# Patient Record
Sex: Male | Born: 1950
Health system: Southern US, Community
[De-identification: ages and names within clinical notes are randomized; demographics above are authoritative.]

## PROBLEM LIST (undated history)

## (undated) DIAGNOSIS — R42 Dizziness and giddiness: Secondary | ICD-10-CM

## (undated) DIAGNOSIS — I452 Bifascicular block: Secondary | ICD-10-CM

## (undated) DIAGNOSIS — M1612 Unilateral primary osteoarthritis, left hip: Secondary | ICD-10-CM

## (undated) DIAGNOSIS — I440 Atrioventricular block, first degree: Secondary | ICD-10-CM

## (undated) DIAGNOSIS — J449 Chronic obstructive pulmonary disease, unspecified: Secondary | ICD-10-CM

## (undated) DIAGNOSIS — F32A Depression, unspecified: Secondary | ICD-10-CM

## (undated) DIAGNOSIS — K219 Gastro-esophageal reflux disease without esophagitis: Secondary | ICD-10-CM

## (undated) DIAGNOSIS — G4730 Sleep apnea, unspecified: Secondary | ICD-10-CM

## (undated) DIAGNOSIS — I77819 Aortic ectasia, unspecified site: Secondary | ICD-10-CM

## (undated) DIAGNOSIS — J40 Bronchitis, not specified as acute or chronic: Secondary | ICD-10-CM

## (undated) DIAGNOSIS — F329 Major depressive disorder, single episode, unspecified: Secondary | ICD-10-CM

## (undated) DIAGNOSIS — I10 Essential (primary) hypertension: Secondary | ICD-10-CM

## (undated) DIAGNOSIS — I2510 Atherosclerotic heart disease of native coronary artery without angina pectoris: Secondary | ICD-10-CM

## (undated) HISTORY — PX: WISDOM TOOTH EXTRACTION: SHX21

## (undated) HISTORY — PX: COLONOSCOPY: SHX174

## (undated) HISTORY — PX: CATARACT EXTRACTION: SUR2

---

## 2002-10-29 ENCOUNTER — Ambulatory Visit (HOSPITAL_COMMUNITY): Admission: RE | Admit: 2002-10-29 | Discharge: 2002-10-29 | Payer: Self-pay | Admitting: Gastroenterology

## 2002-11-14 ENCOUNTER — Ambulatory Visit (HOSPITAL_COMMUNITY): Admission: RE | Admit: 2002-11-14 | Discharge: 2002-11-14 | Payer: Self-pay | Admitting: Gastroenterology

## 2011-09-17 ENCOUNTER — Other Ambulatory Visit: Payer: Self-pay | Admitting: Family Medicine

## 2011-09-17 DIAGNOSIS — M545 Low back pain, unspecified: Secondary | ICD-10-CM

## 2011-09-21 ENCOUNTER — Ambulatory Visit
Admission: RE | Admit: 2011-09-21 | Discharge: 2011-09-21 | Disposition: A | Discharge status: Home / Self Care | Payer: Medicare Other | Source: Ambulatory Visit | Attending: Family Medicine | Admitting: Family Medicine

## 2011-09-21 DIAGNOSIS — M545 Low back pain, unspecified: Secondary | ICD-10-CM

## 2011-10-07 ENCOUNTER — Other Ambulatory Visit: Payer: Self-pay | Admitting: Neurosurgery

## 2011-10-12 ENCOUNTER — Encounter (HOSPITAL_COMMUNITY): Payer: Self-pay | Admitting: Pharmacy Technician

## 2011-10-12 NOTE — Pre-Procedure Instructions (Signed)
20 Yam[address] Kopf  10/12/2011   Your procedure is scheduled on:  Friday, October 18th  Report to [facility] at 0530 AM.  Call this number if you have problems the morning of surgery: [phone]   Remember:   Do not eat food or drink:After Midnight.  Take these medicines the morning of surgery with A SIP OF WATER: ultram   Do not wear jewelry, make-up or nail polish.  Do not wear lotions, powders, or perfumes.   Do not shave 48 hours prior to surgery. Men may shave face and neck.  Do not bring valuables to the hospital.  Contacts, dentures or bridgework may not be worn into surgery.  Leave suitcase in the car. After surgery it may be brought to your room.  For patients admitted to the hospital, checkout time is 11:00 AM the day of discharge.   Patients discharged the day of surgery will not be allowed to drive home.  Special Instructions: Shower using CHG 2 nights before surgery and the night before surgery.  If you shower the day of surgery use CHG.  Use special wash - you have one bottle of CHG for all showers.  You should use approximately 1/3 of the bottle for each shower.   Please read over the following fact sheets that you were given: Pain Booklet, Coughing and Deep Breathing, MRSA Information and Surgical Site Infection Prevention

## 2011-10-13 ENCOUNTER — Encounter (HOSPITAL_COMMUNITY)
Admission: RE | Admit: 2011-10-13 | Discharge: 2011-10-13 | Disposition: A | Discharge status: Home / Self Care | Payer: Medicare Other | Source: Ambulatory Visit | Attending: Neurosurgery | Admitting: Neurosurgery

## 2011-10-13 ENCOUNTER — Encounter (HOSPITAL_COMMUNITY): Payer: Self-pay

## 2011-10-13 DIAGNOSIS — Z01818 Encounter for other preprocedural examination: Secondary | ICD-10-CM | POA: Insufficient documentation

## 2011-10-13 DIAGNOSIS — I10 Essential (primary) hypertension: Secondary | ICD-10-CM | POA: Insufficient documentation

## 2011-10-13 DIAGNOSIS — Z01810 Encounter for preprocedural cardiovascular examination: Secondary | ICD-10-CM | POA: Insufficient documentation

## 2011-10-13 DIAGNOSIS — Z01812 Encounter for preprocedural laboratory examination: Secondary | ICD-10-CM | POA: Insufficient documentation

## 2011-10-13 HISTORY — DX: Major depressive disorder, single episode, unspecified: F32.9

## 2011-10-13 HISTORY — DX: Essential (primary) hypertension: I10

## 2011-10-13 HISTORY — DX: Dizziness and giddiness: R42

## 2011-10-13 HISTORY — DX: Depression, unspecified: F32.A

## 2011-10-13 LAB — BASIC METABOLIC PANEL
BUN: 13 mg/dL (ref 6–23)
CO2: 27 mEq/L (ref 19–32)
Calcium: 9.8 mg/dL (ref 8.4–10.5)
Chloride: 100 mEq/L (ref 96–112)
Creatinine, Ser: 1.26 mg/dL (ref 0.50–1.35)
GFR calc Af Amer: 70 mL/min — ABNORMAL LOW (ref >90)
GFR calc non Af Amer: 60 mL/min — ABNORMAL LOW (ref >90)
Glucose, Bld: 87 mg/dL (ref 70–99)
Potassium: 4.1 mEq/L (ref 3.5–5.1)
Sodium: 138 mEq/L (ref 135–145)

## 2011-10-13 LAB — CBC
HCT: 42.2 % (ref 39.0–52.0)
Hemoglobin: 15.2 g/dL (ref 13.0–17.0)
MCH: 32.3 pg (ref 26.0–34.0)
MCHC: 36 g/dL (ref 30.0–36.0)
MCV: 89.6 fL (ref 78.0–100.0)
Platelets: 127 10*3/uL — ABNORMAL LOW (ref 150–400)
RBC: 4.71 MIL/uL (ref 4.22–5.81)
RDW: 13.6 % (ref 11.5–15.5)
WBC: 8.7 10*3/uL (ref 4.0–10.5)

## 2011-10-13 LAB — SURGICAL PCR SCREEN
MRSA, PCR: NEGATIVE
Staphylococcus aureus: POSITIVE — AB

## 2011-10-13 NOTE — Progress Notes (Addendum)
Primary Physician - Dr. Evelene Croon Does not have a cardiologist.  Has not had any type of cardiac work-up within last 5 years (cannot recall where he had those testing >5 years ago performed)

## 2011-10-14 ENCOUNTER — Encounter (HOSPITAL_COMMUNITY): Payer: Self-pay | Admitting: Vascular Surgery

## 2011-10-14 ENCOUNTER — Other Ambulatory Visit (HOSPITAL_COMMUNITY): Payer: Medicare Other

## 2011-10-14 NOTE — Consult Note (Addendum)
Anesthesia chart review: Patient is a 61 year old male scheduled for left lumbar 4-5 decompression lumbar laminectomy, discectomy, possible bilateral by Dr. Wynetta Emery on 10/22/11.  History includes nonsmoker, hypertension, depression, vertigo, wisdom tooth extraction. No specific PCP is listed.  EKG on 10/13/2011 showed sinus bradycardia with first-degree AV block, incomplete right bundle-branch block, nonspecific T wave abnormality. Currently, there are no comparison EKGs available.  CXR on 10/13/11 showed: 1. No acute cardiopulmonary process.  2. Potential left ventricular calcifications could represent an aneurysm or infarction.  CXR report called to [facility] at Dr. Lonie Peak office.  Labs noted.  PLT are low at 127K.  Currently, there are no comparison labs available.  I reviewed above, including CXR film and EKG, with Anesthesiologist Dr. Noreene Larsson.  If patient is asymptomatic from a CV standpoint then would plan to proceed from an anesthesia standpoint.  Shonna Chock, PA-C

## 2011-10-22 ENCOUNTER — Encounter (HOSPITAL_COMMUNITY): Admission: RE | Payer: Self-pay | Source: Ambulatory Visit

## 2011-10-22 ENCOUNTER — Ambulatory Visit (HOSPITAL_COMMUNITY): Admission: RE | Admit: 2011-10-22 | Payer: Medicare Other | Source: Ambulatory Visit | Admitting: Neurosurgery

## 2011-10-22 SURGERY — LUMBAR LAMINECTOMY/DECOMPRESSION MICRODISCECTOMY 1 LEVEL
Anesthesia: General | Site: Back | Laterality: Left

## 2011-10-25 ENCOUNTER — Encounter (HOSPITAL_COMMUNITY): Payer: Self-pay | Admitting: Pharmacy Technician

## 2011-11-01 NOTE — Pre-Procedure Instructions (Signed)
20 Daviyon Widmayer  11/01/2011   Your procedure is scheduled on:  Monday November 08, 2011.  Report to [facility] at 0900 AM.  Call this number if you have problems the morning of surgery: [phone]   Remember:   Do not eat food or drink:After Midnight.    Take these medicines the morning of surgery with A SIP OF WATER: None   Do not wear jewelry  Do not wear lotions or colognes.  Men may shave face and neck.  Do not bring valuables to the hospital.  Contacts, dentures or bridgework may not be worn into surgery.  Leave suitcase in the car. After surgery it may be brought to your room.  For patients admitted to the hospital, checkout time is 11:00 AM the day of discharge.   Patients discharged the day of surgery will not be allowed to drive home.  Name and phone number of your driver:   Special Instructions: Shower using CHG 2 nights before surgery and the night before surgery.  If you shower the day of surgery use CHG.  Use special wash - you have one bottle of CHG for all showers.  You should use approximately 1/3 of the bottle for each shower.   Please read over the following fact sheets that you were given: Pain Booklet, Coughing and Deep Breathing, MRSA Information and Surgical Site Infection Prevention

## 2011-11-02 ENCOUNTER — Encounter (HOSPITAL_COMMUNITY)
Admission: RE | Admit: 2011-11-02 | Discharge: 2011-11-02 | Disposition: A | Discharge status: Home / Self Care | Payer: Medicare Other | Source: Ambulatory Visit | Attending: Neurosurgery | Admitting: Neurosurgery

## 2011-11-02 ENCOUNTER — Encounter (HOSPITAL_COMMUNITY): Payer: Self-pay

## 2011-11-02 HISTORY — DX: Bronchitis, not specified as acute or chronic: J40

## 2011-11-02 LAB — BASIC METABOLIC PANEL
BUN: 13 mg/dL (ref 6–23)
CO2: 23 mEq/L (ref 19–32)
Calcium: 9.5 mg/dL (ref 8.4–10.5)
Chloride: 95 mEq/L — ABNORMAL LOW (ref 96–112)
Creatinine, Ser: 1.26 mg/dL (ref 0.50–1.35)
GFR calc Af Amer: 70 mL/min — ABNORMAL LOW (ref >90)
GFR calc non Af Amer: 60 mL/min — ABNORMAL LOW (ref >90)
Glucose, Bld: 66 mg/dL — ABNORMAL LOW (ref 70–99)
Potassium: 3.6 mEq/L (ref 3.5–5.1)
Sodium: 132 mEq/L — ABNORMAL LOW (ref 135–145)

## 2011-11-02 LAB — CBC
HCT: 43.9 % (ref 39.0–52.0)
Hemoglobin: 15.7 g/dL (ref 13.0–17.0)
MCH: 31.9 pg (ref 26.0–34.0)
MCHC: 35.8 g/dL (ref 30.0–36.0)
MCV: 89.2 fL (ref 78.0–100.0)
Platelets: 181 10*3/uL (ref 150–400)
RBC: 4.92 MIL/uL (ref 4.22–5.81)
RDW: 13.7 % (ref 11.5–15.5)
WBC: 9 10*3/uL (ref 4.0–10.5)

## 2011-11-02 NOTE — Progress Notes (Signed)
Patient informed Nurse that he had a stress test but is unaware of where it occurred. Patient denied having a cardiac cath, or sleep study. PCP is Dr. Evelene Croon. LOV was earlier this year.

## 2011-11-02 NOTE — Progress Notes (Signed)
Forwarded Abnormal lab to anesthesia for review.

## 2011-11-03 NOTE — Consult Note (Signed)
Anesthesia chart review: Patient is a 61 year old male posted for a one level decompression/laminectomy on 11/08/11 by Dr. Wynetta Emery.  (See my note from 10/22/11.)  This procedure was initially scheduled for 10/22/11, but patient wanted to reschedule to first attempt physical therapy.  Dr. Wynetta Emery also wanted him to have a chest CT (see below) to further evaluate his CXR findings from 10/13/11.  PCP is Dr. Philis Fendt with [facility].  Chest CT with contrast on 10/18/11 at [facility] showed calcifications on plain films represents pericardial calcifications, likely related to prior pericardial infection or hemorrhage.  Coronary calcifications within both the right and left coronary artery.  No aortic aneurysms or dissection.  Labs noted.  I called and spoke with Charles Navarro.  He denies known history of pericarditis of pericardial effusion.  He has never had chest pain and denies SOB and even dyspnea with moderate activity.  He denies edema.  He has never had prior cardiac studies other than an EKG.  Prior to his back pain and radiculopathy symptoms that began in September of this year, he was walking 5 miles per day and was weight lifting up to 100 lbs.    I reviewed above including recent CXR, EKG, and chest CT scan findings with Anesthesiologist Dr. Michelle Piper.  Since patient is asymptomatic with good recent exercise tolerance then plan to proceed in no significant change in his status.  Shonna Chock, PA-C 11/03/11 1718

## 2011-11-08 ENCOUNTER — Inpatient Hospital Stay (HOSPITAL_COMMUNITY): Payer: Medicare Other

## 2011-11-08 ENCOUNTER — Encounter (HOSPITAL_COMMUNITY): Payer: Self-pay | Admitting: Vascular Surgery

## 2011-11-08 ENCOUNTER — Encounter (HOSPITAL_COMMUNITY)
Admission: RE | Disposition: A | Discharge status: Home / Self Care | Payer: Self-pay | Source: Ambulatory Visit | Attending: Neurosurgery

## 2011-11-08 ENCOUNTER — Encounter (HOSPITAL_COMMUNITY): Payer: Self-pay | Admitting: Surgery

## 2011-11-08 ENCOUNTER — Inpatient Hospital Stay (HOSPITAL_COMMUNITY): Payer: Medicare Other | Admitting: Vascular Surgery

## 2011-11-08 ENCOUNTER — Inpatient Hospital Stay (HOSPITAL_COMMUNITY)
Admission: RE | Admit: 2011-11-08 | Discharge: 2011-11-09 | DRG: 491 | Disposition: A | Discharge status: Home / Self Care | Payer: Medicare Other | Source: Ambulatory Visit | Attending: Neurosurgery | Admitting: Neurosurgery

## 2011-11-08 DIAGNOSIS — Z7982 Long term (current) use of aspirin: Secondary | ICD-10-CM

## 2011-11-08 DIAGNOSIS — Z79899 Other long term (current) drug therapy: Secondary | ICD-10-CM

## 2011-11-08 DIAGNOSIS — F329 Major depressive disorder, single episode, unspecified: Secondary | ICD-10-CM | POA: Diagnosis present

## 2011-11-08 DIAGNOSIS — M5126 Other intervertebral disc displacement, lumbar region: Principal | ICD-10-CM | POA: Diagnosis present

## 2011-11-08 DIAGNOSIS — R209 Unspecified disturbances of skin sensation: Secondary | ICD-10-CM | POA: Diagnosis present

## 2011-11-08 DIAGNOSIS — Z885 Allergy status to narcotic agent status: Secondary | ICD-10-CM

## 2011-11-08 DIAGNOSIS — I10 Essential (primary) hypertension: Secondary | ICD-10-CM | POA: Diagnosis present

## 2011-11-08 DIAGNOSIS — Z888 Allergy status to other drugs, medicaments and biological substances status: Secondary | ICD-10-CM

## 2011-11-08 DIAGNOSIS — F3289 Other specified depressive episodes: Secondary | ICD-10-CM | POA: Diagnosis present

## 2011-11-08 HISTORY — PX: LUMBAR LAMINECTOMY/DECOMPRESSION MICRODISCECTOMY: SHX5026

## 2011-11-08 SURGERY — LUMBAR LAMINECTOMY/DECOMPRESSION MICRODISCECTOMY 1 LEVEL
Anesthesia: General | Site: Back | Laterality: Left | Wound class: Clean

## 2011-11-08 MED ORDER — HYDROCHLOROTHIAZIDE 12.5 MG PO CAPS
12.5000 mg | ORAL_CAPSULE | Freq: Every day | ORAL | Status: DC
  Administered 2011-11-08 – 2011-11-09 (×2): 12.5 mg via ORAL
  Filled 2011-11-08 (×2): qty 1

## 2011-11-08 MED ORDER — CYCLOBENZAPRINE HCL 10 MG PO TABS: 10.0000 mg | ORAL_TABLET | Freq: Three times a day (TID) | ORAL | Status: DC | PRN

## 2011-11-08 MED ORDER — LACTATED RINGERS IV SOLN
INTRAVENOUS | Status: DC | PRN
  Administered 2011-11-08 (×2): via INTRAVENOUS

## 2011-11-08 MED ORDER — THROMBIN 5000 UNITS EX SOLR
CUTANEOUS | Status: DC | PRN
  Administered 2011-11-08 (×2): 5000 [IU] via TOPICAL

## 2011-11-08 MED ORDER — PHENOL 1.4 % MT LIQD: 1.0000 | OROMUCOSAL | Status: DC | PRN

## 2011-11-08 MED ORDER — ADULT MULTIVITAMIN W/MINERALS CH
1.0000 | ORAL_TABLET | Freq: Every day | ORAL | Status: DC
  Administered 2011-11-09: 1 via ORAL
  Filled 2011-11-08: qty 1

## 2011-11-08 MED ORDER — LISINOPRIL 10 MG PO TABS
10.0000 mg | ORAL_TABLET | Freq: Every day | ORAL | Status: DC
  Administered 2011-11-08 – 2011-11-09 (×2): 10 mg via ORAL
  Filled 2011-11-08 (×2): qty 1

## 2011-11-08 MED ORDER — CYCLOBENZAPRINE HCL 10 MG PO TABS
10.0000 mg | ORAL_TABLET | Freq: Three times a day (TID) | ORAL | Status: DC | PRN
  Administered 2011-11-08: 10 mg via ORAL

## 2011-11-08 MED ORDER — SODIUM CHLORIDE 0.9 % IJ SOLN
3.0000 mL | Freq: Two times a day (BID) | INTRAMUSCULAR | Status: DC
  Administered 2011-11-08: 3 mL via INTRAVENOUS

## 2011-11-08 MED ORDER — SODIUM CHLORIDE 0.9 % IV SOLN: 250.0000 mL | INTRAVENOUS | Status: DC

## 2011-11-08 MED ORDER — MIDAZOLAM HCL 5 MG/5ML IJ SOLN
INTRAMUSCULAR | Status: DC | PRN
  Administered 2011-11-08: 2 mg via INTRAVENOUS

## 2011-11-08 MED ORDER — PHENTERMINE HCL 37.5 MG PO TABS: 37.5000 mg | ORAL_TABLET | Freq: Every day | ORAL | Status: DC

## 2011-11-08 MED ORDER — CEFAZOLIN SODIUM 1-5 GM-% IV SOLN
1.0000 g | Freq: Three times a day (TID) | INTRAVENOUS | Status: AC
  Administered 2011-11-08 – 2011-11-09 (×2): 1 g via INTRAVENOUS
  Filled 2011-11-08 (×2): qty 50

## 2011-11-08 MED ORDER — CEFAZOLIN SODIUM 10 G IJ SOLR
3.0000 g | INTRAMUSCULAR | Status: AC
  Administered 2011-11-08: 3 g via INTRAVENOUS
  Filled 2011-11-08 (×2): qty 3000

## 2011-11-08 MED ORDER — BACITRACIN 50000 UNITS IM SOLR
INTRAMUSCULAR | Status: DC | PRN
  Administered 2011-11-08: 12:00:00

## 2011-11-08 MED ORDER — FENTANYL CITRATE 0.05 MG/ML IJ SOLN
INTRAMUSCULAR | Status: DC | PRN
  Administered 2011-11-08: 150 ug via INTRAVENOUS

## 2011-11-08 MED ORDER — BUPIVACAINE HCL (PF) 0.25 % IJ SOLN
INTRAMUSCULAR | Status: DC | PRN
  Administered 2011-11-08: 10 mL

## 2011-11-08 MED ORDER — PROPOFOL 10 MG/ML IV BOLUS
INTRAVENOUS | Status: DC | PRN
  Administered 2011-11-08: 180 mg via INTRAVENOUS

## 2011-11-08 MED ORDER — HYDROMORPHONE HCL PF 1 MG/ML IJ SOLN: 0.5000 mg | INTRAMUSCULAR | Status: DC | PRN

## 2011-11-08 MED ORDER — ACETAMINOPHEN 325 MG PO TABS: 650.0000 mg | ORAL_TABLET | ORAL | Status: DC | PRN

## 2011-11-08 MED ORDER — MELOXICAM 15 MG PO TABS
15.0000 mg | ORAL_TABLET | Freq: Every day | ORAL | Status: DC
  Administered 2011-11-09: 15 mg via ORAL
  Filled 2011-11-08: qty 1

## 2011-11-08 MED ORDER — LISINOPRIL-HYDROCHLOROTHIAZIDE 10-12.5 MG PO TABS: 1.0000 | ORAL_TABLET | Freq: Every day | ORAL | Status: DC

## 2011-11-08 MED ORDER — GLYCOPYRROLATE 0.2 MG/ML IJ SOLN
INTRAMUSCULAR | Status: DC | PRN
  Administered 2011-11-08: 0.4 mg via INTRAVENOUS

## 2011-11-08 MED ORDER — LIDOCAINE-EPINEPHRINE 1 %-1:100000 IJ SOLN
INTRAMUSCULAR | Status: DC | PRN
  Administered 2011-11-08: 10 mL

## 2011-11-08 MED ORDER — ASPIRIN EC 81 MG PO TBEC
81.0000 mg | DELAYED_RELEASE_TABLET | Freq: Every day | ORAL | Status: DC
  Administered 2011-11-09: 81 mg via ORAL
  Filled 2011-11-08: qty 1

## 2011-11-08 MED ORDER — ETODOLAC 400 MG PO TABS
400.0000 mg | ORAL_TABLET | Freq: Every day | ORAL | Status: DC
  Administered 2011-11-09: 400 mg via ORAL
  Filled 2011-11-08: qty 1

## 2011-11-08 MED ORDER — MENTHOL 3 MG MT LOZG: 1.0000 | LOZENGE | OROMUCOSAL | Status: DC | PRN

## 2011-11-08 MED ORDER — ALUM & MAG HYDROXIDE-SIMETH 200-200-20 MG/5ML PO SUSP: 30.0000 mL | Freq: Four times a day (QID) | ORAL | Status: DC | PRN

## 2011-11-08 MED ORDER — ROCURONIUM BROMIDE 100 MG/10ML IV SOLN
INTRAVENOUS | Status: DC | PRN
  Administered 2011-11-08: 20 mg via INTRAVENOUS
  Administered 2011-11-08: 50 mg via INTRAVENOUS

## 2011-11-08 MED ORDER — SODIUM CHLORIDE 0.9 % IV SOLN
INTRAVENOUS | Status: AC
  Filled 2011-11-08: qty 500

## 2011-11-08 MED ORDER — LIDOCAINE HCL (CARDIAC) 20 MG/ML IV SOLN
INTRAVENOUS | Status: DC | PRN
  Administered 2011-11-08: 40 mg via INTRAVENOUS

## 2011-11-08 MED ORDER — NEOSTIGMINE METHYLSULFATE 1 MG/ML IJ SOLN
INTRAMUSCULAR | Status: DC | PRN
  Administered 2011-11-08: 3 mg via INTRAVENOUS

## 2011-11-08 MED ORDER — ARTIFICIAL TEARS OP OINT
TOPICAL_OINTMENT | OPHTHALMIC | Status: DC | PRN
  Administered 2011-11-08: 1 via OPHTHALMIC

## 2011-11-08 MED ORDER — BACITRACIN 50000 UNITS IM SOLR
INTRAMUSCULAR | Status: AC
  Filled 2011-11-08: qty 1

## 2011-11-08 MED ORDER — CYCLOBENZAPRINE HCL 10 MG PO TABS
ORAL_TABLET | ORAL | Status: AC
  Filled 2011-11-08: qty 1

## 2011-11-08 MED ORDER — ONDANSETRON HCL 4 MG/2ML IJ SOLN
4.0000 mg | INTRAMUSCULAR | Status: DC | PRN
  Administered 2011-11-08: 4 mg via INTRAVENOUS

## 2011-11-08 MED ORDER — DOCUSATE SODIUM 100 MG PO CAPS
100.0000 mg | ORAL_CAPSULE | Freq: Two times a day (BID) | ORAL | Status: DC
  Administered 2011-11-08 – 2011-11-09 (×2): 100 mg via ORAL
  Filled 2011-11-08 (×2): qty 1

## 2011-11-08 MED ORDER — ACETAMINOPHEN 650 MG RE SUPP: 650.0000 mg | RECTAL | Status: DC | PRN

## 2011-11-08 MED ORDER — OXYCODONE-ACETAMINOPHEN 5-325 MG PO TABS
1.0000 | ORAL_TABLET | ORAL | Status: DC | PRN
  Administered 2011-11-08 – 2011-11-09 (×2): 2 via ORAL
  Filled 2011-11-08: qty 2

## 2011-11-08 MED ORDER — HEMOSTATIC AGENTS (NO CHARGE) OPTIME
TOPICAL | Status: DC | PRN
  Administered 2011-11-08: 1 via TOPICAL

## 2011-11-08 MED ORDER — ONDANSETRON HCL 4 MG/2ML IJ SOLN
INTRAMUSCULAR | Status: AC
  Filled 2011-11-08: qty 2

## 2011-11-08 MED ORDER — OXYCODONE-ACETAMINOPHEN 5-325 MG PO TABS
ORAL_TABLET | ORAL | Status: AC
  Filled 2011-11-08: qty 2

## 2011-11-08 MED ORDER — ONDANSETRON HCL 4 MG/2ML IJ SOLN
INTRAMUSCULAR | Status: DC | PRN
  Administered 2011-11-08: 4 mg via INTRAVENOUS

## 2011-11-08 MED ORDER — HYDROMORPHONE HCL PF 1 MG/ML IJ SOLN
INTRAMUSCULAR | Status: AC
  Filled 2011-11-08: qty 1

## 2011-11-08 MED ORDER — 0.9 % SODIUM CHLORIDE (POUR BTL) OPTIME
TOPICAL | Status: DC | PRN
  Administered 2011-11-08: 1000 mL

## 2011-11-08 MED ORDER — SODIUM CHLORIDE 0.9 % IJ SOLN
3.0000 mL | INTRAMUSCULAR | Status: DC | PRN
  Administered 2011-11-08: 3 mL via INTRAVENOUS

## 2011-11-08 SURGICAL SUPPLY — 59 items
BAG DECANTER FOR FLEXI CONT (MISCELLANEOUS) ×2 IMPLANT
BENZOIN TINCTURE PRP APPL 2/3 (GAUZE/BANDAGES/DRESSINGS) ×2 IMPLANT
BLADE SURG 11 STRL SS (BLADE) ×2 IMPLANT
BLADE SURG ROTATE 9660 (MISCELLANEOUS) IMPLANT
BRUSH SCRUB EZ PLAIN DRY (MISCELLANEOUS) ×2 IMPLANT
BUR MATCHSTICK NEURO 3.0 LAGG (BURR) ×2 IMPLANT
BUR PRECISION FLUTE 6.0 (BURR) ×2 IMPLANT
CANISTER SUCTION 2500CC (MISCELLANEOUS) ×2 IMPLANT
CLOTH BEACON ORANGE TIMEOUT ST (SAFETY) ×2 IMPLANT
CONT SPEC 4OZ CLIKSEAL STRL BL (MISCELLANEOUS) ×2 IMPLANT
DECANTER SPIKE VIAL GLASS SM (MISCELLANEOUS) ×2 IMPLANT
DERMABOND ADVANCED (GAUZE/BANDAGES/DRESSINGS) ×1
DERMABOND ADVANCED .7 DNX12 (GAUZE/BANDAGES/DRESSINGS) ×1 IMPLANT
DRAPE INCISE IOBAN 85X60 (DRAPES) ×2 IMPLANT
DRAPE LAPAROTOMY 100X72X124 (DRAPES) ×2 IMPLANT
DRAPE MICROSCOPE ZEISS OPMI (DRAPES) ×2 IMPLANT
DRAPE POUCH INSTRU U-SHP 10X18 (DRAPES) ×2 IMPLANT
DRAPE PROXIMA HALF (DRAPES) IMPLANT
DRAPE SURG 17X23 STRL (DRAPES) ×2 IMPLANT
DRSG OPSITE 4X5.5 SM (GAUZE/BANDAGES/DRESSINGS) ×2 IMPLANT
ELECT REM PT RETURN 9FT ADLT (ELECTROSURGICAL) ×2
ELECTRODE REM PT RTRN 9FT ADLT (ELECTROSURGICAL) ×1 IMPLANT
EVACUATOR 1/8 PVC DRAIN (DRAIN) ×2 IMPLANT
GAUZE SPONGE 4X4 16PLY XRAY LF (GAUZE/BANDAGES/DRESSINGS) IMPLANT
GLOVE BIO SURGEON STRL SZ8 (GLOVE) ×2 IMPLANT
GLOVE BIOGEL PI IND STRL 6.5 (GLOVE) ×1 IMPLANT
GLOVE BIOGEL PI IND STRL 8 (GLOVE) ×2 IMPLANT
GLOVE BIOGEL PI IND STRL 8.5 (GLOVE) ×2 IMPLANT
GLOVE BIOGEL PI INDICATOR 6.5 (GLOVE) ×1
GLOVE BIOGEL PI INDICATOR 8 (GLOVE) ×2
GLOVE BIOGEL PI INDICATOR 8.5 (GLOVE) ×2
GLOVE ECLIPSE 8.0 STRL XLNG CF (GLOVE) ×2 IMPLANT
GLOVE EXAM NITRILE LRG STRL (GLOVE) IMPLANT
GLOVE EXAM NITRILE MD LF STRL (GLOVE) ×2 IMPLANT
GLOVE EXAM NITRILE XL STR (GLOVE) IMPLANT
GLOVE EXAM NITRILE XS STR PU (GLOVE) IMPLANT
GLOVE INDICATOR 6.5 STRL GRN (GLOVE) ×2 IMPLANT
GLOVE INDICATOR 8.5 STRL (GLOVE) ×2 IMPLANT
GOWN BRE IMP SLV AUR LG STRL (GOWN DISPOSABLE) ×2 IMPLANT
GOWN BRE IMP SLV AUR XL STRL (GOWN DISPOSABLE) ×4 IMPLANT
GOWN STRL REIN 2XL LVL4 (GOWN DISPOSABLE) ×4 IMPLANT
KIT BASIN OR (CUSTOM PROCEDURE TRAY) ×2 IMPLANT
KIT ROOM TURNOVER OR (KITS) ×2 IMPLANT
NEEDLE HYPO 22GX1.5 SAFETY (NEEDLE) ×2 IMPLANT
NEEDLE SPNL 22GX3.5 QUINCKE BK (NEEDLE) ×2 IMPLANT
NS IRRIG 1000ML POUR BTL (IV SOLUTION) ×2 IMPLANT
PACK LAMINECTOMY NEURO (CUSTOM PROCEDURE TRAY) ×2 IMPLANT
RUBBERBAND STERILE (MISCELLANEOUS) ×4 IMPLANT
SPONGE GAUZE 4X4 12PLY (GAUZE/BANDAGES/DRESSINGS) ×2 IMPLANT
SPONGE SURGIFOAM ABS GEL SZ50 (HEMOSTASIS) ×2 IMPLANT
STRIP CLOSURE SKIN 1/2X4 (GAUZE/BANDAGES/DRESSINGS) ×2 IMPLANT
SUT VIC AB 0 CT1 18XCR BRD8 (SUTURE) ×1 IMPLANT
SUT VIC AB 0 CT1 8-18 (SUTURE) ×1
SUT VIC AB 2-0 CT1 18 (SUTURE) ×2 IMPLANT
SUT VICRYL 4-0 PS2 18IN ABS (SUTURE) ×2 IMPLANT
SYR 20ML ECCENTRIC (SYRINGE) ×2 IMPLANT
TOWEL OR 17X24 6PK STRL BLUE (TOWEL DISPOSABLE) ×2 IMPLANT
TOWEL OR 17X26 10 PK STRL BLUE (TOWEL DISPOSABLE) ×2 IMPLANT
WATER STERILE IRR 1000ML POUR (IV SOLUTION) ×2 IMPLANT

## 2011-11-08 NOTE — Op Note (Signed)
Preoperative diagnosis: L5 radiculopathy from large ruptured disc L4-5  Postoperative diagnosis: Same  Procedure: Lumbar laminectomy microdiscectomy L4-5 from the left with microdissection of the left L5 nerve root microscopic discectomy  Surgeon: Dr. Jillyn Hidden Kaiesha Tonner  Assistant: Dr. Barnett Abu  Anesthesia: Gen.  EBL: Minimal  History of present illness: Patient is a 61 year old some is a long-standing back and thumb and left leg pain rate in the back of his leg the top of his foot and big toe U. occasionally also have right leg symptoms he did have numbness in both feet periodically workup revealed a very large disc herniation predominantly central and leftward causing severe thecal sac compression spinal stenosis and bifrontal stenosis and L5 nerve roots to the patient takes her treatment imaging findings and progression of clinical syndrome I recommended laminectomy microdiscectomy The patient attempted to the left at l45 to get adequate decompression with discectomy if not mapped expose the right. I went with a wrist nevus of the operation as well as perioperative course and expectations of outcome and alternatives of surgery he understood and agreed to proceed forward.  Operative procedure: Patient was brought in the or was induced under general anesthesia and positioned prone the Wilson frame his back was prepped and draped in routine sterile fashion. Preoperative x-ray localize the appropriate levels after infiltration 10 cc lidocaine with epi a midline incision was made and Bovie light car was used to gas the fascia subperiosteal dissection was carried out in what appeared to be L4-5 lamina complex however interoperative X. identify this is the L3-4 interspace so attention was taken one interspace below this and using a high-speed drill the interest of L4 medial facet complex aggressive L5 is drilled down and the laminotomy was begun with a 3 minute Kerrison punch. The ligament was identified and  removed in piecemeal fashion. At this point the open reduction of drape brought in the field limits illumination further under biting of the medial gutter was carried out dissecting the ligament off of the dura the dura same markedly tense. The L5 foramen was unroofed flush with pedicle and using I nerve hook to dissect the L5 nerve root off of her large disc herniations still partially contained within the ligament. Immediate of annulotomy was initially with 11 blade scalpel and using I nerve hook a very large fragment was fished out from underneath the thecal sac and the proximal L5 nerve root. This allowed the thecal sac and L5 nerve root to be more easily mobilized and he was reflected medially with the recover the space and further introduced tablet additional large fibers removed the central compartment and the spaces cleanout pituitary rongeurs Epstein curettes and again the discectomy there is no further stenosis and explored all across the midline with a hockey-stick coronary dilator all way out the entirety of the L5 foramen to the interest of the L5 pedicle inferior and superior the disc space. Because of his palpate no resistance no additional fragments I elected not to expose the right side. With hopes is not to further destabilize him with a bilateral exposure. Then the wounds and to proceed her get meticulous in space was maintained a Hemovac drain was placed and the wounds closed in layers with after Vicryl and skin was closed running 4 subcuticular benzo and Steri-Strips were applied patient recovered in stable condition at the case in a counts sponge counts were correct.

## 2011-11-08 NOTE — Transfer of Care (Signed)
Immediate Anesthesia Transfer of Care Note  Patient: Charles Navarro  Procedure(s) Performed: Procedure(s) (LRB) with comments: LUMBAR LAMINECTOMY/DECOMPRESSION MICRODISCECTOMY 1 LEVEL (Left) - left lumbar four-five decompression laminectomy microdiscectomy  Patient Location: PACU  Anesthesia Type:General  Level of Consciousness: awake, alert , oriented and patient cooperative  Airway & Oxygen Therapy: Patient Spontanous Breathing and Patient connected to face mask oxygen  Post-op Assessment: Report given to PACU RN, Post -op Vital signs reviewed and stable and Patient moving all extremities  Post vital signs: Reviewed and stable  Complications: No apparent anesthesia complications

## 2011-11-08 NOTE — Anesthesia Procedure Notes (Signed)
Procedure Name: Intubation Date/Time: 11/08/2011 11:52 AM Performed by: Jerilee Hoh Pre-anesthesia Checklist: Patient identified, Emergency Drugs available, Suction available and Patient being monitored Patient Re-evaluated:Patient Re-evaluated prior to inductionOxygen Delivery Method: Circle system utilized Preoxygenation: Pre-oxygenation with 100% oxygen Intubation Type: IV induction Ventilation: Mask ventilation without difficulty and Oral airway inserted - appropriate to patient size Laryngoscope Size: Mac and 4 Grade View: Grade II Tube type: Oral Tube size: 7.5 mm Number of attempts: 1 Airway Equipment and Method: Stylet Placement Confirmation: ETT inserted through vocal cords under direct vision,  positive ETCO2 and breath sounds checked- equal and bilateral Secured at: 23 cm Tube secured with: Tape Dental Injury: Teeth and Oropharynx as per pre-operative assessment

## 2011-11-08 NOTE — H&P (Signed)
Charles Navarro is an 61 y.o. male.   Chief Complaint: Back and left greater right leg pain HPI: Patient is a junior gentleman who had the sudden onset acute back pain with radiation down from his left leg a would vac using both legs with numbness tingling his feet and pain in his left ankle this is been refractory to all forms of conservative treatment imaging findings showed a very large disc herniation with a free fragment causing severe thecal sac compression spinal stenosis L4-5 into the size location fragment I status her treatment progression of clinical syndrome I recommended laminectomy microdiscectomy explained to him that I made her left expose both sides secondary to the nature of the size of the fragment I would try from the left initially. Weeks as reviewed the risks benefits of the operation as well as perioperative course expectations of outcome alternatives of surgery he understands and agrees to proceed forward the  Past Medical History  Diagnosis Date  . Hypertension     takes meds daily  . Depression   . Vertigo   . Bronchitis     hx of    Past Surgical History  Procedure Date  . Colonoscopy   . Wisdom tooth extraction     History reviewed. No pertinent family history. Social History:  reports that he has never smoked. He does not have any smokeless tobacco history on file. He reports that he does not drink alcohol or use illicit drugs.  Allergies:  Allergies  Allergen Reactions  . Codeine Nausea And Vomiting    dizziness  . Tramadol Nausea And Vomiting    Medications Prior to Admission  Medication Sig Dispense Refill  . aspirin EC 81 MG tablet Take 81 mg by mouth daily.      . Cholecalciferol (VITAMIN D3) 10000 UNITS capsule Take 10,000 Units by mouth 2 (two) times daily.      . cyclobenzaprine (FLEXERIL) 10 MG tablet Take 10 mg by mouth 3 (three) times daily as needed. For muscles spasms      . etodolac (LODINE) 400 MG tablet Take 400 mg by mouth 2 (two) times  daily as needed. For pain      . lisinopril-hydrochlorothiazide (PRINZIDE,ZESTORETIC) 10-12.5 MG per tablet Take 1 tablet by mouth daily.      . meloxicam (MOBIC) 15 MG tablet Take 15 mg by mouth daily as needed. For pain      . Methylsulfonylmethane 1000 MG CAPS Take 1,000 mg by mouth 2 (two) times daily.      . Multiple Vitamin (MULTIVITAMIN WITH MINERALS) TABS Take 1 tablet by mouth daily.      . Omega-3 Fatty Acids (FISH OIL) 1000 MG CAPS Take 1,000 mg by mouth 2 (two) times daily.      . phentermine (ADIPEX-P) 37.5 MG tablet Take 37.5 mg by mouth daily before breakfast.        No results found for this or any previous visit (from the past 48 hour(s)). No results found.  Review of Systems  Constitutional: Negative.   HENT: Negative.   Eyes: Negative.   Respiratory: Negative.   Cardiovascular: Negative.   Gastrointestinal: Negative.   Genitourinary: Negative.   Musculoskeletal: Positive for myalgias and back pain.  Skin: Negative.   Neurological: Positive for tingling.    Blood pressure 153/87, pulse 61, temperature 97.9 F (36.6 C), temperature source Oral, resp. rate 18, SpO2 98.00%. Physical Exam  Constitutional: He is oriented to person, place, and time. He appears well-developed.  HENT:  Head: Normocephalic.  Eyes: Pupils are equal, round, and reactive to light.  Neck: Normal range of motion.  Respiratory: Effort normal.  GI: Soft.  Neurological: He is alert and oriented to person, place, and time. He has normal strength. GCS eye subscore is 4. GCS verbal subscore is 5. GCS motor subscore is 6.  Reflex Scores:      Patellar reflexes are 0 on the right side and 0 on the left side.      Achilles reflexes are 0 on the right side and 0 on the left side.      Strength is 5 out of 5 his iliopsoas,, quads, and she's, gastrocs, into tibialis, and EHL.     Assessment/Plan 61 year old and presents for L4-5 laminectomy discectomy  Randen Kauth P 11/08/2011, 11:27 AM

## 2011-11-08 NOTE — Anesthesia Preprocedure Evaluation (Signed)
Anesthesia Evaluation  Patient identified by MRN, date of birth, ID band Patient awake    Reviewed: Allergy & Precautions, H&P , NPO status , Patient's Chart, lab work & pertinent test results  Airway Mallampati: II  Neck ROM: Full    Dental  (+) Teeth Intact and Dental Advisory Given   Pulmonary  breath sounds clear to auscultation        Cardiovascular Rhythm:Regular Rate:Normal     Neuro/Psych    GI/Hepatic   Endo/Other    Renal/GU      Musculoskeletal   Abdominal   Peds  Hematology   Anesthesia Other Findings   Reproductive/Obstetrics                           Anesthesia Physical Anesthesia Plan  ASA: II  Anesthesia Plan: General   Post-op Pain Management:    Induction: Intravenous  Airway Management Planned: Oral ETT  Additional Equipment:   Intra-op Plan:   Post-operative Plan: Extubation in OR  Informed Consent: I have reviewed the patients History and Physical, chart, labs and discussed the procedure including the risks, benefits and alternatives for the proposed anesthesia with the patient or authorized representative who has indicated his/her understanding and acceptance.   Dental advisory given  Plan Discussed with: CRNA and Surgeon  Anesthesia Plan Comments: (htn Lumbar spinal stenosis Obesity  Plan GA with oral ETT  Kipp Brood, MD)        Anesthesia Quick Evaluation

## 2011-11-08 NOTE — Preoperative (Signed)
Beta Blockers   Reason not to administer Beta Blockers:Not Applicable 

## 2011-11-09 ENCOUNTER — Encounter (HOSPITAL_COMMUNITY): Payer: Self-pay | Admitting: Neurosurgery

## 2011-11-09 MED ORDER — CYCLOBENZAPRINE HCL 10 MG PO TABS: 10.0000 mg | ORAL_TABLET | Freq: Three times a day (TID) | ORAL | Status: DC | PRN

## 2011-11-09 MED ORDER — OXYCODONE-ACETAMINOPHEN 5-325 MG PO TABS: 1.0000 | ORAL_TABLET | ORAL | Status: DC | PRN

## 2011-11-09 NOTE — Discharge Summary (Signed)
  Physician Discharge Summary  Patient ID: Charles Navarro MRN: [id number] DOB/AGE: [birth year] 61 y.o.  Admit date: 11/08/2011 Discharge date: 11/09/2011  Admission Diagnoses: L5 radiculopathy left greater right from large ruptured disc L4-5  Discharge Diagnoses: Same Active Problems:  * No active hospital problems. *    Discharged Condition: good  Hospital Course: Patient is admitted hospital underwent a lumbar limiting microdiscectomy on the left at L4-5 postoperatively patient did very well recovered in the floor on the floor patient was convalescing well ambulating and voiding spontaneously tolerating regular diet stable and be discharged home scheduled followup in one to 2 weeks.  Consults: Significant Diagnostic Studies: Treatments: L4-5 laminectomy microdiscectomy from the left Discharge Exam: Blood pressure 123/78, pulse 66, temperature 97.9 F (36.6 C), temperature source Oral, resp. rate 18, SpO2 99.00%. Strength out of 5 wound clean and dry  Disposition: Home     Medication List     As of 11/09/2011  9:35 AM    TAKE these medications         aspirin EC 81 MG tablet   Take 81 mg by mouth daily.      cyclobenzaprine 10 MG tablet   Commonly known as: FLEXERIL   Take 10 mg by mouth 3 (three) times daily as needed. For muscles spasms      cyclobenzaprine 10 MG tablet   Commonly known as: FLEXERIL   Take 1 tablet (10 mg total) by mouth 3 (three) times daily as needed for muscle spasms.      etodolac 400 MG tablet   Commonly known as: LODINE   Take 400 mg by mouth 2 (two) times daily as needed. For pain      Fish Oil 1000 MG Caps   Take 1,000 mg by mouth 2 (two) times daily.      lisinopril-hydrochlorothiazide 10-12.5 MG per tablet   Commonly known as: PRINZIDE,ZESTORETIC   Take 1 tablet by mouth daily.      meloxicam 15 MG tablet   Commonly known as: MOBIC   Take 15 mg by mouth daily as needed. For pain      Methylsulfonylmethane 1000 MG Caps   Take 1,000  mg by mouth 2 (two) times daily.      multivitamin with minerals Tabs   Take 1 tablet by mouth daily.      oxyCODONE-acetaminophen 5-325 MG per tablet   Commonly known as: PERCOCET/ROXICET   Take 1-2 tablets by mouth every 4 (four) hours as needed.      phentermine 37.5 MG tablet   Commonly known as: ADIPEX-P   Take 37.5 mg by mouth daily before breakfast.      Vitamin D3 10000 UNITS capsule   Take 10,000 Units by mouth 2 (two) times daily.         Signed: Miette Molenda P 11/09/2011, 9:35 AM

## 2011-11-09 NOTE — Anesthesia Postprocedure Evaluation (Signed)
  Anesthesia Post-op Note  Patient: Charles Navarro  Procedure(s) Performed: Procedure(s) (LRB) with comments: LUMBAR LAMINECTOMY/DECOMPRESSION MICRODISCECTOMY 1 LEVEL (Left) - left lumbar four-five decompression laminectomy microdiscectomy  Patient Location:   Pt discharged.

## 2011-11-09 NOTE — Progress Notes (Signed)
Pt given D/C instructions with Rx's, verbal understanding given. Pt D/C'd home via wheelchair with wife per MD order. Ranada Vigorito, RN 

## 2011-11-09 NOTE — Progress Notes (Signed)
Utilization review completed. Jovaun Levene, RN, BSN. 

## 2011-11-09 NOTE — Progress Notes (Signed)
Subjective: Patient reports Doing very well no leg pain very minimal back stiffness exam the living and voiding spontaneously  Objective: Vital signs in last 24 hours: Temp:  [97 F (36.1 C)-98.5 F (36.9 C)] 97.9 F (36.6 C) (11/05 0400) Pulse Rate:  [48-70] 66  (11/05 0400) Resp:  [8-32] 18  (11/05 0400) BP: (118-146)/(65-88) 123/78 mmHg (11/05 0400) SpO2:  [98 %-100 %] 99 % (11/05 0400)  Intake/Output from previous day: 11/04 0701 - 11/05 0700 In: 1930 [P.O.:480; I.V.:1400; IV Piggyback:50] Out: 190 [Drains:90; Blood:100] Intake/Output this shift: Total I/O In: 480 [P.O.:480] Out: -   Strength out of 5 wound clean and dry  Lab Results: No results found for this basename: WBC:2,HGB:2,HCT:2,PLT:2 in the last 72 hours BMET No results found for this basename: NA:2,K:2,CL:2,CO2:2,GLUCOSE:2,BUN:2,CREATININE:2,CALCIUM:2 in the last 72 hours  Studies/Results: Dg Lumbar Spine 2-3 Views  11/08/2011  *RADIOLOGY REPORT*  Clinical Data: Laminectomy, discectomy at L4-5.  LUMBAR SPINE - 2-3 VIEW  Comparison: MR 09/21/2011  Findings: First lateral intraoperative image demonstrates posterior needle directed at the L4 vertebral body.  Second lateral intraoperative image demonstrates posterior surgical instruments posterior to the L4 vertebral body and L4-5 disc base.  IMPRESSION: Intraoperative localization as above.   Original Report Authenticated By: Charlett Nose, M.D.     Assessment/Plan: DC home  LOS: 1 day     Nyriah Coote P 11/09/2011, 9:33 AM

## 2012-01-06 ENCOUNTER — Other Ambulatory Visit: Payer: Self-pay | Admitting: Neurosurgery

## 2012-01-06 DIAGNOSIS — IMO0002 Reserved for concepts with insufficient information to code with codable children: Secondary | ICD-10-CM

## 2012-01-11 ENCOUNTER — Ambulatory Visit
Admission: RE | Admit: 2012-01-11 | Discharge: 2012-01-11 | Disposition: A | Discharge status: Home / Self Care | Payer: Medicare Other | Source: Ambulatory Visit | Attending: Neurosurgery | Admitting: Neurosurgery

## 2012-01-11 DIAGNOSIS — IMO0002 Reserved for concepts with insufficient information to code with codable children: Secondary | ICD-10-CM

## 2012-01-11 MED ORDER — GADOBENATE DIMEGLUMINE 529 MG/ML IV SOLN
10.0000 mL | Freq: Once | INTRAVENOUS | Status: AC | PRN
Start: 2012-01-11 — End: 2012-01-11
  Administered 2012-01-11: 10 mL via INTRAVENOUS

## 2012-01-12 ENCOUNTER — Inpatient Hospital Stay (HOSPITAL_COMMUNITY)
Admission: EM | Admit: 2012-01-12 | Discharge: 2012-01-19 | DRG: 541 | Disposition: A | Discharge status: Home Health | Payer: Medicare Other | Attending: Neurosurgery | Admitting: Neurosurgery

## 2012-01-12 ENCOUNTER — Encounter (HOSPITAL_COMMUNITY): Payer: Self-pay | Admitting: Adult Health

## 2012-01-12 DIAGNOSIS — Z7982 Long term (current) use of aspirin: Secondary | ICD-10-CM

## 2012-01-12 DIAGNOSIS — M519 Unspecified thoracic, thoracolumbar and lumbosacral intervertebral disc disorder: Secondary | ICD-10-CM | POA: Diagnosis present

## 2012-01-12 DIAGNOSIS — I10 Essential (primary) hypertension: Secondary | ICD-10-CM | POA: Diagnosis present

## 2012-01-12 DIAGNOSIS — F329 Major depressive disorder, single episode, unspecified: Secondary | ICD-10-CM | POA: Diagnosis present

## 2012-01-12 DIAGNOSIS — M869 Osteomyelitis, unspecified: Principal | ICD-10-CM | POA: Diagnosis present

## 2012-01-12 DIAGNOSIS — M4640 Discitis, unspecified, site unspecified: Secondary | ICD-10-CM

## 2012-01-12 DIAGNOSIS — R634 Abnormal weight loss: Secondary | ICD-10-CM | POA: Diagnosis present

## 2012-01-12 DIAGNOSIS — F3289 Other specified depressive episodes: Secondary | ICD-10-CM | POA: Diagnosis present

## 2012-01-12 DIAGNOSIS — Z79899 Other long term (current) drug therapy: Secondary | ICD-10-CM

## 2012-01-12 LAB — BASIC METABOLIC PANEL
BUN: 19 mg/dL (ref 6–23)
CO2: 23 mEq/L (ref 19–32)
Calcium: 9.5 mg/dL (ref 8.4–10.5)
Chloride: 93 mEq/L — ABNORMAL LOW (ref 96–112)
Creatinine, Ser: 1.55 mg/dL — ABNORMAL HIGH (ref 0.50–1.35)
GFR calc Af Amer: 54 mL/min — ABNORMAL LOW (ref >90)
GFR calc non Af Amer: 47 mL/min — ABNORMAL LOW (ref >90)
Glucose, Bld: 168 mg/dL — ABNORMAL HIGH (ref 70–99)
Potassium: 3.4 mEq/L — ABNORMAL LOW (ref 3.5–5.1)
Sodium: 128 mEq/L — ABNORMAL LOW (ref 135–145)

## 2012-01-12 LAB — CBC WITH DIFFERENTIAL/PLATELET
Basophils Absolute: 0 10*3/uL (ref 0.0–0.1)
Basophils Relative: 1 % (ref 0–1)
Eosinophils Absolute: 0.2 10*3/uL (ref 0.0–0.7)
Eosinophils Relative: 3 % (ref 0–5)
HCT: 37.4 % — ABNORMAL LOW (ref 39.0–52.0)
Hemoglobin: 12.8 g/dL — ABNORMAL LOW (ref 13.0–17.0)
Lymphocytes Relative: 21 % (ref 12–46)
Lymphs Abs: 1.6 10*3/uL (ref 0.7–4.0)
MCH: 29.3 pg (ref 26.0–34.0)
MCHC: 34.2 g/dL (ref 30.0–36.0)
MCV: 85.6 fL (ref 78.0–100.0)
Monocytes Absolute: 0.7 10*3/uL (ref 0.1–1.0)
Monocytes Relative: 9 % (ref 3–12)
Neutro Abs: 5.2 10*3/uL (ref 1.7–7.7)
Neutrophils Relative %: 67 % (ref 43–77)
Platelets: 217 10*3/uL (ref 150–400)
RBC: 4.37 MIL/uL (ref 4.22–5.81)
RDW: 12.6 % (ref 11.5–15.5)
WBC: 7.8 10*3/uL (ref 4.0–10.5)

## 2012-01-12 LAB — SEDIMENTATION RATE: Sed Rate: 44 mm/hr — ABNORMAL HIGH (ref 0–16)

## 2012-01-12 LAB — C-REACTIVE PROTEIN: CRP: 7.8 mg/dL — ABNORMAL HIGH (ref <0.60)

## 2012-01-12 MED ORDER — PHENOL 1.4 % MT LIQD
1.0000 | OROMUCOSAL | Status: DC | PRN
  Filled 2012-01-12: qty 177

## 2012-01-12 MED ORDER — DEXTROSE 5 % IV SOLN
2.0000 g | Freq: Once | INTRAVENOUS | Status: AC
  Administered 2012-01-12: 2 g via INTRAVENOUS
  Filled 2012-01-12: qty 2

## 2012-01-12 MED ORDER — HYDROCHLOROTHIAZIDE 12.5 MG PO CAPS
12.5000 mg | ORAL_CAPSULE | Freq: Every day | ORAL | Status: DC
  Administered 2012-01-14 – 2012-01-19 (×6): 12.5 mg via ORAL
  Filled 2012-01-12 (×7): qty 1

## 2012-01-12 MED ORDER — SODIUM CHLORIDE 0.9 % IV SOLN
250.0000 mL | INTRAVENOUS | Status: DC
  Administered 2012-01-12 – 2012-01-19 (×2): 250 mL via INTRAVENOUS

## 2012-01-12 MED ORDER — OXYCODONE-ACETAMINOPHEN 5-325 MG PO TABS
1.0000 | ORAL_TABLET | ORAL | Status: DC | PRN
Start: 2012-01-12 — End: 2012-01-19
  Administered 2012-01-12: 2 via ORAL
  Administered 2012-01-13: 1 via ORAL
  Administered 2012-01-14 – 2012-01-17 (×10): 2 via ORAL
  Administered 2012-01-18 – 2012-01-19 (×2): 1 via ORAL
  Filled 2012-01-12 (×13): qty 2

## 2012-01-12 MED ORDER — HYDROMORPHONE HCL PF 1 MG/ML IJ SOLN
0.5000 mg | INTRAMUSCULAR | Status: DC | PRN
  Administered 2012-01-13 – 2012-01-16 (×8): 1 mg via INTRAVENOUS
  Filled 2012-01-12 (×9): qty 1

## 2012-01-12 MED ORDER — LISINOPRIL-HYDROCHLOROTHIAZIDE 10-12.5 MG PO TABS: 1.0000 | ORAL_TABLET | Freq: Every day | ORAL | Status: DC

## 2012-01-12 MED ORDER — LISINOPRIL 10 MG PO TABS
10.0000 mg | ORAL_TABLET | Freq: Every day | ORAL | Status: DC
  Administered 2012-01-13 – 2012-01-19 (×7): 10 mg via ORAL
  Filled 2012-01-12 (×7): qty 1

## 2012-01-12 MED ORDER — DEXTROSE 5 % IV SOLN
2.0000 g | INTRAVENOUS | Status: DC
  Administered 2012-01-13 – 2012-01-17 (×5): 2 g via INTRAVENOUS
  Filled 2012-01-12 (×7): qty 2

## 2012-01-12 MED ORDER — ACETAMINOPHEN 650 MG RE SUPP: 650.0000 mg | RECTAL | Status: DC | PRN

## 2012-01-12 MED ORDER — ONDANSETRON HCL 4 MG/2ML IJ SOLN
4.0000 mg | INTRAMUSCULAR | Status: DC | PRN
  Administered 2012-01-17: 4 mg via INTRAVENOUS
  Filled 2012-01-12: qty 2

## 2012-01-12 MED ORDER — DOCUSATE SODIUM 100 MG PO CAPS
100.0000 mg | ORAL_CAPSULE | Freq: Two times a day (BID) | ORAL | Status: DC
  Administered 2012-01-12 – 2012-01-18 (×12): 100 mg via ORAL
  Filled 2012-01-12 (×12): qty 1

## 2012-01-12 MED ORDER — SODIUM CHLORIDE 0.9 % IJ SOLN
3.0000 mL | Freq: Two times a day (BID) | INTRAMUSCULAR | Status: DC
  Administered 2012-01-12 – 2012-01-18 (×8): 3 mL via INTRAVENOUS

## 2012-01-12 MED ORDER — OXYCODONE-ACETAMINOPHEN 5-325 MG PO TABS
1.0000 | ORAL_TABLET | ORAL | Status: DC | PRN
  Administered 2012-01-12 – 2012-01-16 (×2): 2 via ORAL
  Filled 2012-01-12 (×5): qty 2

## 2012-01-12 MED ORDER — SODIUM CHLORIDE 0.9 % IV SOLN
1250.0000 mg | Freq: Two times a day (BID) | INTRAVENOUS | Status: DC
  Administered 2012-01-13 – 2012-01-14 (×3): 1250 mg via INTRAVENOUS
  Filled 2012-01-12 (×5): qty 1250

## 2012-01-12 MED ORDER — MENTHOL 3 MG MT LOZG
1.0000 | LOZENGE | OROMUCOSAL | Status: DC | PRN
  Filled 2012-01-12: qty 9

## 2012-01-12 MED ORDER — METHYLSULFONYLMETHANE 1000 MG PO CAPS: 1000.0000 mg | ORAL_CAPSULE | Freq: Two times a day (BID) | ORAL | Status: DC

## 2012-01-12 MED ORDER — ACETAMINOPHEN 325 MG PO TABS: 650.0000 mg | ORAL_TABLET | ORAL | Status: DC | PRN

## 2012-01-12 MED ORDER — PANTOPRAZOLE SODIUM 40 MG IV SOLR
40.0000 mg | Freq: Every day | INTRAVENOUS | Status: DC
  Administered 2012-01-12 – 2012-01-16 (×5): 40 mg via INTRAVENOUS
  Filled 2012-01-12 (×6): qty 40

## 2012-01-12 MED ORDER — VANCOMYCIN HCL IN DEXTROSE 1-5 GM/200ML-% IV SOLN: 1000.0000 mg | Freq: Once | INTRAVENOUS | Status: DC

## 2012-01-12 MED ORDER — CYCLOBENZAPRINE HCL 10 MG PO TABS
10.0000 mg | ORAL_TABLET | Freq: Three times a day (TID) | ORAL | Status: DC | PRN
  Administered 2012-01-12 – 2012-01-19 (×7): 10 mg via ORAL
  Filled 2012-01-12 (×8): qty 1

## 2012-01-12 MED ORDER — SODIUM CHLORIDE 0.9 % IJ SOLN: 3.0000 mL | INTRAMUSCULAR | Status: DC | PRN

## 2012-01-12 MED ORDER — RIFAMPIN 300 MG PO CAPS
300.0000 mg | ORAL_CAPSULE | Freq: Two times a day (BID) | ORAL | Status: DC
  Administered 2012-01-12 – 2012-01-17 (×10): 300 mg via ORAL
  Filled 2012-01-12 (×12): qty 1

## 2012-01-12 MED ORDER — VANCOMYCIN HCL 10 G IV SOLR
2500.0000 mg | Freq: Once | INTRAVENOUS | Status: AC
  Administered 2012-01-12: 2500 mg via INTRAVENOUS
  Filled 2012-01-12 (×2): qty 2500

## 2012-01-12 NOTE — ED Notes (Signed)
Please page Neurosurgeon when pt. Arrives to room.  [phone].

## 2012-01-12 NOTE — ED Notes (Signed)
Post Lumbar laminectomy nov 4th, having severe lumbar pain  For 4 weeks between L4-L5. Had MRI last night by Neurosurgery Dr. Wynetta Emery, sent here today due to possible lumbar spine infection. Pt denies fever, reports weakness. Denies numbness and tingling, having bowel movements normally. Neurosurgeon pager, [phone]. Neurosurgery paged and notified that pt is here.

## 2012-01-12 NOTE — H&P (Signed)
Charles Navarro is an 62 y.o. male.   Chief Complaint: Back pain HPI: Patient is very pleasant 62 year old son who underwent previous laminectomy and discectomy about 6 weeks ago initially did very well. However last couple weeks his progress worsening back pain with no leg pain he denies any numbness and tingling denies any difficulty with bowel bladder just reports pain that's been worsening in his low back that's been refractory to oxycodone and ibuprofen. So patient was last week we were called MRI scan MRI scan was performed yesterday report came in today to suggestive of discitis and osteomyelitis. Subsequent to the patient brought in the ER and are in the process of working him up and admitting him.  Past Medical History  Diagnosis Date  . Hypertension     takes meds daily  . Depression   . Vertigo   . Bronchitis     hx of    Past Surgical History  Procedure Date  . Colonoscopy   . Wisdom tooth extraction   . Lumbar laminectomy/decompression microdiscectomy 11/08/2011    Procedure: LUMBAR LAMINECTOMY/DECOMPRESSION MICRODISCECTOMY 1 LEVEL;  Surgeon: Mariam Dollar, MD;  Location: [facility];  Service: Neurosurgery;  Laterality: Left;  left lumbar four-five decompression laminectomy microdiscectomy    History reviewed. No pertinent family history. Social History:  reports that he has never smoked. He does not have any smokeless tobacco history on file. He reports that he does not drink alcohol or use illicit drugs.  Allergies:  Allergies  Allergen Reactions  . Codeine Nausea And Vomiting    dizziness  . Tramadol Nausea And Vomiting     (Not in a hospital admission)  No results found for this or any previous visit (from the past 48 hour(s)). Mr Lumbar Spine W Wo Contrast  01/12/2012  **ADDENDUM** CREATED: 01/12/2012 10:29:52  BUN and creatinine were obtained on site at [facility] at [address] Results:  BUN 14 mg/dL,  Creatinine 1.8 mg/dL.  **END ADDENDUM** SIGNED  BY: Allayne Gitelman. Jena Gauss, M.D.   01/12/2012  **ADDENDUM** CREATED: 01/12/2012 09:30:59  Critical Value/emergent results were called by telephone at the time of interpretation on 11/11/2012 at 9:30 a.m. to Bunny at Dr. Lonie Peak office, who verbally acknowledged these results.  **END ADDENDUM** SIGNED BY: Allayne Gitelman. Jena Gauss, M.D.   01/12/2012  *RADIOLOGY REPORT*  Clinical Data: Lumbar neuritis.  Severe low back pain.  Insert lobe at this 14  MRI LUMBAR SPINE WITHOUT AND WITH CONTRAST  Technique:  Multiplanar and multiecho pulse sequences of the lumbar spine were obtained without and with intravenous contrast.  Contrast: 10mL MULTIHANCE GADOBENATE DIMEGLUMINE 529 MG/ML IV SOLN 1.8  Comparison: MRI dated 09/21/2011  Findings: The patient has developed diskitis at L4-5 with adjacent osteomyelitis of the L4-L5 vertebrae.  There is a paraspinal phlegmon.  There is fluid in the disc space.  Fluid and soft tissue protruding to the spinal canal and compress the thecal sac and the lateral recesses.  The patient has had a left laminectomy. There are no fluid collections in the paraspinal soft tissues. There is no epidural abscess although there is enhancement of the soft tissues anterior to the thecal sac at the L4-5 level.  The small soft disc protrusion centrally at L3-4 is less prominent than on the prior exam.  The remainder of the lumbar spine is stable.  IMPRESSION:  1.  Diskitis and osteomyelitis at L4-5 with a small paraspinal phlegmon. 2. Fluid and soft tissue protrude from the disc space and  compress the thecal sac and both lateral recesses.  Original Report Authenticated By: Francene Boyers, M.D.     Review of Systems  Constitutional: Negative.   HENT: Negative.   Eyes: Negative.   Respiratory: Negative.   Cardiovascular: Negative.   Gastrointestinal: Negative.   Genitourinary: Negative.   Musculoskeletal: Positive for back pain.  Skin: Negative.   Neurological: Negative.   Endo/Heme/Allergies: Negative.     Psychiatric/Behavioral: Negative.     Blood pressure 120/64, pulse 81, temperature 97.3 F (36.3 C), temperature source Oral, resp. rate 22, SpO2 99.00%. Physical Exam  Constitutional: He is oriented to person, place, and time. He appears well-developed and well-nourished.  HENT:  Head: Normocephalic.  Eyes: Pupils are equal, round, and reactive to light.  Neck: Normal range of motion.  Cardiovascular: Normal rate.   Respiratory: Effort normal.  GI: Soft.  Musculoskeletal: Normal range of motion.  Neurological: He is alert and oriented to person, place, and time. He has normal strength. GCS eye subscore is 4. GCS verbal subscore is 5. GCS motor subscore is 6.  Reflex Scores:      Tricep reflexes are 1+ on the right side and 1+ on the left side.      Bicep reflexes are 1+ on the right side and 1+ on the left side.      Brachioradialis reflexes are 1+ on the right side and 1+ on the left side.      Patellar reflexes are 1+ on the right side and 1+ on the left side.      Achilles reflexes are 1+ on the right side and 1+ on the left side.      Patient is awake alert and oriented strength is 5 out of 5 in his iliopsoas, quads, and she's, gastrocs and into tibialis, and EHL.  Skin: Skin is warm.     Assessment/Plan 62 year old gentleman status post L4-5 laminectomy discectomy with possible discitis osteomyelitis postop.. plan on admitting the patient checking a sedimentation rate C-reactive protein blood cultures admission labs as well as sitting up for a CT-guided biopsy. After his blood pulses are drawn and will be initiating antibiotic treatment with broad-spectrum antibiotics to include vancomycin, Rocephin, and rifampin.  Maicie Vanderloop P 01/12/2012, 5:55 PM

## 2012-01-12 NOTE — Progress Notes (Signed)
ANTIBIOTIC CONSULT NOTE - INITIAL  Pharmacy Consult for Vancomycin/Rocephin Indication: L4-5 diskitis/osteomyelitis  Allergies  Allergen Reactions  . Codeine Nausea And Vomiting    dizziness  . Tramadol Nausea And Vomiting    Patient Measurements: Height: 6' 5.95" (198 cm) Weight: 304 lb 0.2 oz (137.9 kg) IBW/kg (Calculated) : 91.29   Vital Signs: Temp: 97.3 F (36.3 C) (01/08 1723) Temp src: Oral (01/08 1723) BP: 111/58 mmHg (01/08 1901) Pulse Rate: 62  (01/08 1901) Intake/Output from previous day:   Intake/Output from this shift:    Labs:  Basename 01/12/12 1740  WBC 7.8  HGB 12.8*  PLT 217  LABCREA --  CREATININE 1.55*   Estimated Creatinine Clearance: 77.8 ml/min (by C-G formula based on Cr of 1.55). No results found for this basename: VANCOTROUGH:2,VANCOPEAK:2,VANCORANDOM:2,GENTTROUGH:2,GENTPEAK:2,GENTRANDOM:2,TOBRATROUGH:2,TOBRAPEAK:2,TOBRARND:2,AMIKACINPEAK:2,AMIKACINTROU:2,AMIKACIN:2, in the last 72 hours   Microbiology: No results found for this or any previous visit (from the past 720 hour(s)).  Medical History: Past Medical History  Diagnosis Date  . Hypertension     takes meds daily  . Depression   . Vertigo   . Bronchitis     hx of    Medications:   (Not in a hospital admission) Assessment: 62 y/o male patient admitted with lumbar pain s/p laminectomy and discectomy 6 weeks ago. Found to have diskitis with osteomyelitis requiring broad spectrum antibiotics. Noted elevated scr, will dose according to obesity nomogram. Received 2g rocephin in ED.  Goal of Therapy:  Vancomycin trough level 15-20 mcg/ml  Plan:  Vancomycin 2500mg  IV load followed by 1250mg  IV q12h, rocephin 2g IV q24, monitor renal function and f/u c&s. Measure antibiotic drug levels at steady [address] PharmD, [place] Pager [phone] 01/12/2012,7:04 PM

## 2012-01-12 NOTE — ED Notes (Signed)
Pt complains of itching on lower abdomen around waist band of jogging pants; observed area of complaint and no redness observed outside of where patient had scratched as well as pt reported no SOB or chest pain.  Checked skin surfaces over body and no signs of redness, rashes or any other skin reactions.  Advise pt. May be due to dry skin; went over allergies; none related to medications given thus far.

## 2012-01-12 NOTE — ED Provider Notes (Signed)
MSE was initiated and I personally evaluated the patient and placed orders (if any) at  6:16 PM on January 12, 2012.  The patient appears stable so that the remainder of the MSE may be completed by another provider. Sent in by neurosurgery after MRI. Had lumbar surgery November. Possible infection now. Patient be seen primarily in the ER by Dr. Wynetta Emery.  Harrold Donath R. Rubin Payor, MD 01/12/12 [phone]

## 2012-01-13 ENCOUNTER — Inpatient Hospital Stay (HOSPITAL_COMMUNITY): Payer: Medicare Other

## 2012-01-13 ENCOUNTER — Encounter (HOSPITAL_COMMUNITY): Payer: Self-pay | Admitting: General Practice

## 2012-01-13 LAB — BASIC METABOLIC PANEL
BUN: 14 mg/dL (ref 6–23)
CO2: 24 mEq/L (ref 19–32)
Calcium: 9.2 mg/dL (ref 8.4–10.5)
Chloride: 96 mEq/L (ref 96–112)
Creatinine, Ser: 1.09 mg/dL (ref 0.50–1.35)
GFR calc Af Amer: 83 mL/min — ABNORMAL LOW (ref >90)
GFR calc non Af Amer: 71 mL/min — ABNORMAL LOW (ref >90)
Glucose, Bld: 113 mg/dL — ABNORMAL HIGH (ref 70–99)
Potassium: 3.9 mEq/L (ref 3.5–5.1)
Sodium: 134 mEq/L — ABNORMAL LOW (ref 135–145)

## 2012-01-13 LAB — SURGICAL PCR SCREEN
MRSA, PCR: NEGATIVE
Staphylococcus aureus: NEGATIVE

## 2012-01-13 LAB — PROTIME-INR
INR: 1.17 (ref 0.00–1.49)
Prothrombin Time: 14.7 seconds (ref 11.6–15.2)

## 2012-01-13 LAB — APTT: aPTT: 40 seconds — ABNORMAL HIGH (ref 24–37)

## 2012-01-13 MED ORDER — SODIUM CHLORIDE 0.9 % IV SOLN
INTRAVENOUS | Status: AC | PRN
  Administered 2012-01-13: 75 mL/h via INTRAVENOUS

## 2012-01-13 MED ORDER — MIDAZOLAM HCL 2 MG/2ML IJ SOLN
INTRAMUSCULAR | Status: AC | PRN
  Administered 2012-01-13 (×4): 1 mg via INTRAVENOUS

## 2012-01-13 MED ORDER — FENTANYL CITRATE 0.05 MG/ML IJ SOLN
INTRAMUSCULAR | Status: AC | PRN
  Administered 2012-01-13 (×4): 25 ug via INTRAVENOUS

## 2012-01-13 MED ORDER — SODIUM CHLORIDE 0.9 % IV SOLN: INTRAVENOUS | Status: AC

## 2012-01-13 NOTE — Evaluation (Signed)
Physical Therapy Evaluation Patient Details Name: Charles Navarro MRN: [id number] DOB: 1950/07/17 Today's Date: 01/13/2012 Time: 8469-6295 PT Time Calculation (min): 16 min  PT Assessment / Plan / Recommendation Clinical Impression  62 yo adm with discitis and ?osteomyelitis ~ 6weeks post-op for laminectomy/discectomy. Pt mobilizing well with only very slight antalgic quality to gait that he could correct when made aware of how this can put incr stress on his back. Pt prefers not to use a RW (doesn't want to get dependent or weaker) and currently can mobilize safely without a device. No further acute PT needs identified.Pt in agreement with d/c from PT.    PT Assessment  Patent does not need any further PT services    Follow Up Recommendations  No PT follow up;Supervision - Intermittent                Equipment Recommendations  None recommended by PT    Recommendations for Other Services OT consult         Precautions / Restrictions Precautions Precautions: Back Precaution Booklet Issued: No Precaution Comments: pt able to verbalize precautions   Pertinent Vitals/Pain 3/10 back pain with ambulation (1-2/10 at rest); Discussed using pain as a guide to determine his appropriate activity level (goal to keep pain <8/10 with activity) and use of pain medicine as needed.      Mobility  Bed Mobility Bed Mobility: Rolling Left;Left Sidelying to Sit;Sitting - Scoot to Edge of Bed;Sit to Sidelying Left Rolling Left: 6: Modified independent (Device/Increase time);With rail Left Sidelying to Sit: 6: Modified independent (Device/Increase time) Sitting - Scoot to Edge of Bed: 7: Independent Sit to Sidelying Left: 6: Modified independent (Device/Increase time) Details for Bed Mobility Assistance: correct technique demonstrated; no cues Transfers Transfers: Sit to Stand;Stand to Sit Sit to Stand: 5: Supervision;With upper extremity assist;From bed;From elevated surface Stand to Sit: With  upper extremity assist;To elevated surface;To bed;5: Supervision Details for Transfer Assistance: pt reports feeling generally weak from lack of activity; bed elevated to simulate home environment; pt reports he has only been sitting on high kitchen chairs at home since surgery due to incr pain with lower surfaces Ambulation/Gait Ambulation/Gait Assistance: 5: Supervision Ambulation Distance (Feet): 150 Feet Assistive device: None Ambulation/Gait Assistance Details: supervision for safety due to reports of feeling weak and to push IV pole Gait Pattern: Step-through pattern;Decreased stride length;Antalgic (slight antalgic gait, pt can correct with cues)                 Visit Information  Last PT Received On: 01/13/12 Assistance Needed: +1    Subjective Data  Subjective: Pt reports he had OPPT after surgery and was doing great until suddenly began having pain. Patient Stated Goal: decr pain and incr activity; avoid using RW if he can   Prior Functioning  Home Living Additional Comments: information not obtainded due to: 1) pt assisted to bathroom on PTs immediate arrival, 2) transporter arrived and pt leaving for radiology procedure  Prior Function Level of Independence: Independent Able to Take Stairs?: Yes Communication Communication: No difficulties    Cognition  Overall Cognitive Status: Appears within functional limits for tasks assessed/performed Arousal/Alertness: Awake/alert Orientation Level: Appears intact for tasks assessed Behavior During Session: [facility] for tasks performed    Extremity/Trunk Assessment Right Lower Extremity Assessment RLE ROM/Strength/Tone: [facility] for tasks assessed Left Lower Extremity Assessment LLE ROM/Strength/Tone: [facility] for tasks assessed Trunk Assessment Trunk Assessment: Normal       End of Session PT - End of Session  Activity Tolerance: Patient tolerated treatment well Patient left: in bed;with call bell/phone within reach;with nursing in  room;Other (comment) (transporter) Nurse Communication: Mobility status  GP     Welles Walthall 01/13/2012, 11:53 AM  Pager [phone]

## 2012-01-13 NOTE — Progress Notes (Signed)
Pt returned to unit from RI procedure alert and oriented x 4  Neuro intact  dressing to lower back intact, vital signs stable.   No c/o at present will continue to monitior

## 2012-01-13 NOTE — Procedures (Signed)
S/P L4/L5 disc aspiration with fluoro guidance.

## 2012-01-13 NOTE — ED Notes (Signed)
Pt taken to 4N by transporter

## 2012-01-13 NOTE — Progress Notes (Signed)
Subjective: Patient reports Sedatives [place] pain is improved  Objective: Vital signs in last 24 hours: Temp:  [97.3 F (36.3 C)-98.2 F (36.8 C)] 98.2 F (36.8 C) (01/09 0906) Pulse Rate:  [61-81] 70  (01/09 0906) Resp:  [17-22] 18  (01/09 0906) BP: (103-125)/(54-68) 125/68 mmHg (01/09 0906) SpO2:  [95 %-100 %] 100 % (01/09 0906) Weight:  [123.1 kg (271 lb 6.2 oz)-137.9 kg (304 lb 0.2 oz)] 123.1 kg (271 lb 6.2 oz) (01/08 2048)  Intake/Output from previous day: 01/08 0701 - 01/09 0700 In: -  Out: 2 [Urine:2] Intake/Output this shift:    Neurologically intact and nonfocal  Lab Results:  [facility] 01/12/12 1740  WBC 7.8  HGB 12.8*  HCT 37.4*  PLT 217   BMET  Basename 01/12/12 1740  NA 128*  K 3.4*  CL 93*  CO2 23  GLUCOSE 168*  BUN 19  CREATININE 1.55*  CALCIUM 9.5    Studies/Results: Mr Lumbar Spine W Wo Contrast  01/12/2012  **ADDENDUM** CREATED: 01/12/2012 10:29:52  BUN and creatinine were obtained on site at [facility] at [address] Results:  BUN 14 mg/dL,  Creatinine 1.8 mg/dL.  **END ADDENDUM** SIGNED BY: Allayne Gitelman. Jena Gauss, M.D.   01/12/2012  **ADDENDUM** CREATED: 01/12/2012 09:30:59  Critical Value/emergent results were called by telephone at the time of interpretation on 11/11/2012 at 9:30 a.m. to Bunny at Dr. Lonie Peak office, who verbally acknowledged these results.  **END ADDENDUM** SIGNED BY: Allayne Gitelman. Jena Gauss, M.D.   01/12/2012  *RADIOLOGY REPORT*  Clinical Data: Lumbar neuritis.  Severe low back pain.  Insert lobe at this 14  MRI LUMBAR SPINE WITHOUT AND WITH CONTRAST  Technique:  Multiplanar and multiecho pulse sequences of the lumbar spine were obtained without and with intravenous contrast.  Contrast: 10mL MULTIHANCE GADOBENATE DIMEGLUMINE 529 MG/ML IV SOLN 1.8  Comparison: MRI dated 09/21/2011  Findings: The patient has developed diskitis at L4-5 with adjacent osteomyelitis of the L4-L5 vertebrae.  There is a paraspinal phlegmon.  There is fluid  in the disc space.  Fluid and soft tissue protruding to the spinal canal and compress the thecal sac and the lateral recesses.  The patient has had a left laminectomy. There are no fluid collections in the paraspinal soft tissues. There is no epidural abscess although there is enhancement of the soft tissues anterior to the thecal sac at the L4-5 level.  The small soft disc protrusion centrally at L3-4 is less prominent than on the prior exam.  The remainder of the lumbar spine is stable.  IMPRESSION:  1.  Diskitis and osteomyelitis at L4-5 with a small paraspinal phlegmon. 2. Fluid and soft tissue protrude from the disc space and compress the thecal sac and both lateral recesses.  Original Report Authenticated By: Francene Boyers, M.D.     Assessment/Plan: To be mobilized today with physical therapy plan a CT guided aspiration of his L4-5 disc space today he maintains on his antibiotics his laboratories are normal white count and a sedimentation rate of 44 and culture still negative  LOS: 1 day     Gardiner Espana P 01/13/2012, 10:15 AM

## 2012-01-13 NOTE — H&P (Signed)
Charles Navarro is an 62 y.o. male.   Chief Complaint:  Back surgery 11/2011 Did well for several weeks increasing pain x 3 weeks MRI reveals Lumbar 4-5 diskitis Scheduled now for L4-5 disc aspiration  HPI: HTN; back pain  Past Medical History  Diagnosis Date  . Hypertension     takes meds daily  . Depression   . Vertigo   . Bronchitis     hx of    Past Surgical History  Procedure Date  . Colonoscopy   . Wisdom tooth extraction   . Lumbar laminectomy/decompression microdiscectomy 11/08/2011    Procedure: LUMBAR LAMINECTOMY/DECOMPRESSION MICRODISCECTOMY 1 LEVEL;  Surgeon: Mariam Dollar, MD;  Location: [facility];  Service: Neurosurgery;  Laterality: Left;  left lumbar four-five decompression laminectomy microdiscectomy    History reviewed. No pertinent family history. Social History:  reports that he has never smoked. He does not have any smokeless tobacco history on file. He reports that he does not drink alcohol or use illicit drugs.  Allergies:  Allergies  Allergen Reactions  . Codeine Nausea And Vomiting    dizziness  . Tramadol Nausea And Vomiting    Medications Prior to Admission  Medication Sig Dispense Refill  . aspirin EC 81 MG tablet Take 81 mg by mouth daily.      . Cholecalciferol (VITAMIN D) 2000 UNITS CAPS Take 1 capsule by mouth 2 (two) times daily.      . cyclobenzaprine (FLEXERIL) 10 MG tablet Take 10 mg by mouth 3 (three) times daily as needed. For muscles spasms      . etodolac (LODINE) 400 MG tablet Take 400 mg by mouth 2 (two) times daily as needed. For pain      . ibuprofen (ADVIL,MOTRIN) 800 MG tablet Take 800 mg by mouth every 8 (eight) hours as needed. For pain      . lisinopril-hydrochlorothiazide (PRINZIDE,ZESTORETIC) 10-12.5 MG per tablet Take 1 tablet by mouth daily.      . Magnesium Oxide (MAG-OX PO) Take 1 tablet by mouth daily.      . Methylsulfonylmethane 1000 MG CAPS Take 1,000 mg by mouth 2 (two) times daily.      . Multiple Vitamin  (MULTIVITAMIN WITH MINERALS) TABS Take 1 tablet by mouth daily.      . Omega-3 Fatty Acids (FISH OIL) 1000 MG CAPS Take 1,000 mg by mouth 2 (two) times daily.      Marland Kitchen oxyCODONE-acetaminophen (PERCOCET/ROXICET) 5-325 MG per tablet Take 1-2 tablets by mouth every 4 (four) hours as needed. For pain        Results for orders placed during the hospital encounter of 01/12/12 (from the past 48 hour(s))  C-REACTIVE PROTEIN     Status: Abnormal   Collection Time   01/12/12  5:40 PM      Component Value Range Comment   CRP 7.8 (*) <0.60 mg/dL   SEDIMENTATION RATE     Status: Abnormal   Collection Time   01/12/12  5:40 PM      Component Value Range Comment   Sed Rate 44 (*) 0 - 16 mm/hr   CBC WITH DIFFERENTIAL     Status: Abnormal   Collection Time   01/12/12  5:40 PM      Component Value Range Comment   WBC 7.8  4.0 - 10.5 K/uL    RBC 4.37  4.22 - 5.81 MIL/uL    Hemoglobin 12.8 (*) 13.0 - 17.0 g/dL    HCT 16.1 (*) 09.6 - 52.0 %  MCV 85.6  78.0 - 100.0 fL    MCH 29.3  26.0 - 34.0 pg    MCHC 34.2  30.0 - 36.0 g/dL    RDW 46.9  62.9 - 52.8 %    Platelets 217  150 - 400 K/uL    Neutrophils Relative 67  43 - 77 %    Neutro Abs 5.2  1.7 - 7.7 K/uL    Lymphocytes Relative 21  12 - 46 %    Lymphs Abs 1.6  0.7 - 4.0 K/uL    Monocytes Relative 9  3 - 12 %    Monocytes Absolute 0.7  0.1 - 1.0 K/uL    Eosinophils Relative 3  0 - 5 %    Eosinophils Absolute 0.2  0.0 - 0.7 K/uL    Basophils Relative 1  0 - 1 %    Basophils Absolute 0.0  0.0 - 0.1 K/uL   BASIC METABOLIC PANEL     Status: Abnormal   Collection Time   01/12/12  5:40 PM      Component Value Range Comment   Sodium 128 (*) 135 - 145 mEq/L    Potassium 3.4 (*) 3.5 - 5.1 mEq/L    Chloride 93 (*) 96 - 112 mEq/L    CO2 23  19 - 32 mEq/L    Glucose, Bld 168 (*) 70 - 99 mg/dL    BUN 19  6 - 23 mg/dL    Creatinine, Ser 4.13 (*) 0.50 - 1.35 mg/dL    Calcium 9.5  8.4 - 24.4 mg/dL    GFR calc non Af Amer 47 (*) >90 mL/min    GFR calc Af Amer 54  (*) >90 mL/min   SURGICAL PCR SCREEN     Status: Normal   Collection Time   01/13/12  5:52 AM      Component Value Range Comment   MRSA, PCR NEGATIVE  NEGATIVE    Staphylococcus aureus NEGATIVE  NEGATIVE   APTT     Status: Abnormal   Collection Time   01/13/12  8:20 AM      Component Value Range Comment   aPTT 40 (*) 24 - 37 seconds   PROTIME-INR     Status: Normal   Collection Time   01/13/12  8:20 AM      Component Value Range Comment   Prothrombin Time 14.7  11.6 - 15.2 seconds    INR 1.17  0.00 - 1.49    Mr Lumbar Spine W Wo Contrast  01/12/2012  **ADDENDUM** CREATED: 01/12/2012 10:29:52  BUN and creatinine were obtained on site at [facility] at [address] Results:  BUN 14 mg/dL,  Creatinine 1.8 mg/dL.  **END ADDENDUM** SIGNED BY: Allayne Gitelman. Jena Gauss, M.D.   01/12/2012  **ADDENDUM** CREATED: 01/12/2012 09:30:59  Critical Value/emergent results were called by telephone at the time of interpretation on 11/11/2012 at 9:30 a.m. to Bunny at Dr. Lonie Peak office, who verbally acknowledged these results.  **END ADDENDUM** SIGNED BY: Allayne Gitelman. Jena Gauss, M.D.   01/12/2012  *RADIOLOGY REPORT*  Clinical Data: Lumbar neuritis.  Severe low back pain.  Insert lobe at this 14  MRI LUMBAR SPINE WITHOUT AND WITH CONTRAST  Technique:  Multiplanar and multiecho pulse sequences of the lumbar spine were obtained without and with intravenous contrast.  Contrast: 10mL MULTIHANCE GADOBENATE DIMEGLUMINE 529 MG/ML IV SOLN 1.8  Comparison: MRI dated 09/21/2011  Findings: The patient has developed diskitis at L4-5 with adjacent osteomyelitis of the L4-L5 vertebrae.  There  is a paraspinal phlegmon.  There is fluid in the disc space.  Fluid and soft tissue protruding to the spinal canal and compress the thecal sac and the lateral recesses.  The patient has had a left laminectomy. There are no fluid collections in the paraspinal soft tissues. There is no epidural abscess although there is enhancement of the soft tissues  anterior to the thecal sac at the L4-5 level.  The small soft disc protrusion centrally at L3-4 is less prominent than on the prior exam.  The remainder of the lumbar spine is stable.  IMPRESSION:  1.  Diskitis and osteomyelitis at L4-5 with a small paraspinal phlegmon. 2. Fluid and soft tissue protrude from the disc space and compress the thecal sac and both lateral recesses.  Original Report Authenticated By: Francene Boyers, M.D.     Review of Systems  Constitutional: Negative for fever.  Gastrointestinal: Negative for nausea and vomiting.  Musculoskeletal: Positive for back pain.  Neurological: Positive for weakness.    Blood pressure 125/68, pulse 70, temperature 98.2 F (36.8 C), temperature source Oral, resp. rate 18, height 6\' 6"  (1.981 m), weight 271 lb 6.2 oz (123.1 kg), SpO2 100.00%. Physical Exam  Constitutional: He is oriented to person, place, and time. He appears well-developed and well-nourished.  Cardiovascular: Normal rate, regular rhythm and normal heart sounds.   No murmur heard. Respiratory: Effort normal and breath sounds normal. He has no wheezes.  GI: Soft. Bowel sounds are normal. There is no tenderness.  Musculoskeletal: Normal range of motion.  Neurological: He is alert and oriented to person, place, and time.  Psychiatric: He has a normal mood and affect. His behavior is normal. Judgment and thought content normal.     Assessment/Plan Lumbar laminectomy 11/2011 Increasing pain MRI shows discitis Scheduled now for disc aspiration Pt aware of procedure benefits and risks and agreeable to proceed Consent signed and in chart  Lorelei Heikkila A 01/13/2012, 9:54 AM

## 2012-01-13 NOTE — Progress Notes (Signed)
OT Cancellation Note  Patient Details Name: Charles Navarro MRN: [id number] DOB: 03-05-1950   Cancelled Treatment:    Reason Eval/Treat Not Completed: Patient at procedure or test/ unavailable.  Will continue to follow.  Evern Bio 01/13/2012, 12:25 PM

## 2012-01-13 NOTE — Progress Notes (Signed)
Utilization review completed. Aalivia Mcgraw, RN, BSN. 

## 2012-01-14 LAB — VANCOMYCIN, TROUGH: Vancomycin Tr: 10.3 ug/mL (ref 10.0–20.0)

## 2012-01-14 MED ORDER — SENNOSIDES-DOCUSATE SODIUM 8.6-50 MG PO TABS: 1.0000 | ORAL_TABLET | Freq: Every evening | ORAL | Status: DC | PRN

## 2012-01-14 MED ORDER — VANCOMYCIN HCL 10 G IV SOLR
1250.0000 mg | Freq: Three times a day (TID) | INTRAVENOUS | Status: DC
  Administered 2012-01-14 – 2012-01-15 (×3): 1250 mg via INTRAVENOUS
  Filled 2012-01-14 (×5): qty 1250

## 2012-01-14 NOTE — Progress Notes (Signed)
Pt ambulated in the hall with standby assist, tolerated ambulation well, all VS WNL.  Pt back to bed without incidence, will continue to monitor closely.

## 2012-01-14 NOTE — Progress Notes (Addendum)
ANTIBIOTIC CONSULT NOTE - INITIAL  Pharmacy Consult for Vancomycin/Rocephin Indication: L4-5 diskitis/osteomyelitis  Allergies  Allergen Reactions  . Codeine Nausea And Vomiting    dizziness  . Tramadol Nausea And Vomiting    Patient Measurements: Height: 6\' 6"  (198.1 cm) Weight: 271 lb 6.2 oz (123.1 kg) IBW/kg (Calculated) : 91.4   Vital Signs: Temp: 98.5 F (36.9 C) (01/10 1433) BP: 115/69 mmHg (01/10 1433) Pulse Rate: 73  (01/10 1433) Intake/Output from previous day: 01/09 0701 - 01/10 0700 In: 0  Out: 600 [Urine:600] Intake/Output from this shift:    Labs:  Basename 01/13/12 1635 01/12/12 1740  WBC -- 7.8  HGB -- 12.8*  PLT -- 217  LABCREA -- --  CREATININE 1.09 1.55*   Estimated Creatinine Clearance: 104.8 ml/min (by C-G formula based on Cr of 1.09).  Basename 01/14/12 1820  VANCOTROUGH 10.3  VANCOPEAK --  VANCORANDOM --  GENTTROUGH --  GENTPEAK --  GENTRANDOM --  TOBRATROUGH --  TOBRAPEAK --  TOBRARND --  AMIKACINPEAK --  AMIKACINTROU --  AMIKACIN --     Microbiology: Recent Results (from the past 720 hour(s))  CULTURE, BLOOD (ROUTINE X 2)     Status: Normal (Preliminary result)   Collection Time   01/12/12  5:45 PM      Component Value Range Status Comment   Specimen Description BLOOD ARM RIGHT   Final    Special Requests BOTTLES DRAWN AEROBIC AND ANAEROBIC 10CC   Final    Culture  Setup Time 01/13/2012 00:47   Final    Culture     Final    Value:        BLOOD CULTURE RECEIVED NO GROWTH TO DATE CULTURE WILL BE HELD FOR 5 DAYS BEFORE ISSUING A FINAL NEGATIVE REPORT   Report Status PENDING   Incomplete   CULTURE, BLOOD (ROUTINE X 2)     Status: Normal (Preliminary result)   Collection Time   01/12/12  6:06 PM      Component Value Range Status Comment   Specimen Description BLOOD HAND RIGHT   Final    Special Requests BOTTLES DRAWN AEROBIC ONLY 10CC   Final    Culture  Setup Time 01/13/2012 00:48   Final    Culture     Final    Value:         BLOOD CULTURE RECEIVED NO GROWTH TO DATE CULTURE WILL BE HELD FOR 5 DAYS BEFORE ISSUING A FINAL NEGATIVE REPORT   Report Status PENDING   Incomplete   SURGICAL PCR SCREEN     Status: Normal   Collection Time   01/13/12  5:52 AM      Component Value Range Status Comment   MRSA, PCR NEGATIVE  NEGATIVE Final    Staphylococcus aureus NEGATIVE  NEGATIVE Final   CULTURE, ROUTINE-ABSCESS     Status: Normal (Preliminary result)   Collection Time   01/13/12  1:12 PM      Component Value Range Status Comment   Specimen Description ABSCESS   Final    Special Requests LUMBAR DISC L4 5   Final    Gram Stain     Final    Value: RARE WBC PRESENT, PREDOMINANTLY PMN     RARE SQUAMOUS EPITHELIAL CELLS PRESENT     FEW GRAM POSITIVE COCCI IN PAIRS   Culture NO GROWTH   Final    Report Status PENDING   Incomplete   FUNGUS CULTURE W SMEAR     Status: Normal (Preliminary result)  Collection Time   01/13/12  1:12 PM      Component Value Range Status Comment   Specimen Description ABSCESS   Final    Special Requests LUMBAR DIS L4 5   Final    Fungal Smear NO YEAST OR FUNGAL ELEMENTS SEEN   Final    Culture CULTURE IN PROGRESS FOR FOUR WEEKS   Final    Report Status PENDING   Incomplete     Medical History: Past Medical History  Diagnosis Date  . Hypertension     takes meds daily  . Depression   . Vertigo   . Bronchitis     hx of    Medications:  Prescriptions prior to admission  Medication Sig Dispense Refill  . aspirin EC 81 MG tablet Take 81 mg by mouth daily.      . Cholecalciferol (VITAMIN D) 2000 UNITS CAPS Take 1 capsule by mouth 2 (two) times daily.      . cyclobenzaprine (FLEXERIL) 10 MG tablet Take 10 mg by mouth 3 (three) times daily as needed. For muscles spasms      . etodolac (LODINE) 400 MG tablet Take 400 mg by mouth 2 (two) times daily as needed. For pain      . ibuprofen (ADVIL,MOTRIN) 800 MG tablet Take 800 mg by mouth every 8 (eight) hours as needed. For pain      .  lisinopril-hydrochlorothiazide (PRINZIDE,ZESTORETIC) 10-12.5 MG per tablet Take 1 tablet by mouth daily.      . Magnesium Oxide (MAG-OX PO) Take 1 tablet by mouth daily.      . Methylsulfonylmethane 1000 MG CAPS Take 1,000 mg by mouth 2 (two) times daily.      . Multiple Vitamin (MULTIVITAMIN WITH MINERALS) TABS Take 1 tablet by mouth daily.      . Omega-3 Fatty Acids (FISH OIL) 1000 MG CAPS Take 1,000 mg by mouth 2 (two) times daily.      Marland Kitchen oxyCODONE-acetaminophen (PERCOCET/ROXICET) 5-325 MG per tablet Take 1-2 tablets by mouth every 4 (four) hours as needed. For pain       Assessment: 62 y/o male patient admitted with lumbar pain s/p laminectomy and discectomy 6 weeks ago. Found to have diskitis with osteomyelitis requiring broad spectrum antibiotics.   Vancomycin trough = 10.3 on vancomycin 1250 mg IV q 12 hrs.  Doses given appropriately, trough drawn on schedule.  Scr improved today compared to time of initial vancomycin dosing.  Goal of Therapy:  Vancomycin trough level 15-20 mcg/ml  Plan:  Increase vancomycin to 1250 mg IV q 8 hrs. Next dose due now. Measure antibiotic drug levels at steady state  Reece Leader, [place] D 01/14/2012 7:26 PM

## 2012-01-14 NOTE — Progress Notes (Signed)
Subjective: Patient reports She's feeling better this morning less pain  Objective: Vital signs in last 24 hours: Temp:  [98 F (36.7 C)-98.9 F (37.2 C)] 98.8 F (37.1 C) (01/10 0540) Pulse Rate:  [69-81] 78  (01/10 0540) Resp:  [12-20] 18  (01/10 0540) BP: (103-147)/(49-84) 123/66 mmHg (01/10 0540) SpO2:  [96 %-100 %] 98 % (01/10 0540)  Intake/Output from previous day: 01/09 0701 - 01/10 0700 In: 0  Out: 600 [Urine:600] Intake/Output this shift: Total I/O In: 1080 [P.O.:1080] Out: -   Neurologically intact no focal motor or sensory deficits  Lab Results:  Basename 01/12/12 1740  WBC 7.8  HGB 12.8*  HCT 37.4*  PLT 217   BMET  Basename 01/13/12 1635 01/12/12 1740  NA 134* 128*  K 3.9 3.4*  CL 96 93*  CO2 24 23  GLUCOSE 113* 168*  BUN 14 19  CREATININE 1.09 1.55*  CALCIUM 9.2 9.5    Studies/Results: No results found.  Assessment/Plan: Hospital day 2 for workup of possible L4-5 discitis cultures remain negative head CT aspiration yesterday cultures are still pending we'll continue with close we'll continue him on his current antibiotic regimen will place a PICC line will follow his white count C. reactive protein we'll also had laxative of choice  LOS: 2 days     Jud Fanguy P 01/14/2012, 10:05 AM

## 2012-01-14 NOTE — Progress Notes (Signed)
Order received, chart reviewed, noted pt I to S with PT yesterday. Spoke to pt about the role of OT and he reports that he is not having trouble with BADLs. Has been getting up to BR by himself and washed himself up this AM. No acute OT needs identified, will sign off.

## 2012-01-15 LAB — VANCOMYCIN, TROUGH: Vancomycin Tr: 21.9 ug/mL — ABNORMAL HIGH (ref 10.0–20.0)

## 2012-01-15 MED ORDER — VANCOMYCIN HCL IN DEXTROSE 1-5 GM/200ML-% IV SOLN
1000.0000 mg | Freq: Three times a day (TID) | INTRAVENOUS | Status: DC
  Administered 2012-01-15 – 2012-01-19 (×11): 1000 mg via INTRAVENOUS
  Filled 2012-01-15 (×15): qty 200

## 2012-01-15 MED ORDER — SODIUM CHLORIDE 0.9 % IJ SOLN
10.0000 mL | INTRAMUSCULAR | Status: DC | PRN
  Administered 2012-01-15: 20 mL
  Administered 2012-01-17 – 2012-01-19 (×2): 10 mL

## 2012-01-15 NOTE — Progress Notes (Signed)
Peripherally Inserted Central Catheter/Midline Placement  The IV Nurse has discussed with the patient and/or persons authorized to consent for the patient, the purpose of this procedure and the potential benefits and risks involved with this procedure.  The benefits include less needle sticks, lab draws from the catheter and patient may be discharged home with the catheter.  Risks include, but not limited to, infection, bleeding, blood clot (thrombus formation), and puncture of an artery; nerve damage and irregular heat beat.  Alternatives to this procedure were also discussed.  PICC/Midline Placement Documentation  PICC / Midline Single Lumen 01/15/12 PICC Right Basilic (Active)       Stacie Glaze Horton 01/15/2012, 12:17 PM

## 2012-01-15 NOTE — Progress Notes (Signed)
Pt pain is significantly worse this am, while ambulating to the bathroom, pt became very unsteady and was guided safely back to the bed without incidence.  Pt had been previously signed off with a transfer check off sheet and bed alarm had been turned off.  Bed alarm was placed back on and transfer sheet taken down, pt made aware of these changes and agrees it is not safe for him to ambulate alone.  Will pass on to day RN, will continue to monitor closely.

## 2012-01-15 NOTE — Progress Notes (Signed)
ANTIBIOTIC CONSULT NOTE - FOLLOW UP  Pharmacy Consult for vancomycin Indication: L4-5 diskitis/osteomyelitis   Allergies  Allergen Reactions  . Codeine Nausea And Vomiting    dizziness  . Tramadol Nausea And Vomiting    Patient Measurements: Height: 6\' 6"  (198.1 cm) Weight: 271 lb 6.2 oz (123.1 kg) IBW/kg (Calculated) : 91.4    Vital Signs: Temp: 98.8 F (37.1 C) (01/11 2103) Temp src: Oral (01/11 2103) BP: 114/67 mmHg (01/11 2103) Pulse Rate: 70  (01/11 2103) Intake/Output from previous day: 01/10 0701 - 01/11 0700 In: 1080 [P.O.:1080] Out: 350 [Urine:350] Intake/Output from this shift:    Labs:  Basename 01/13/12 1635  WBC --  HGB --  PLT --  LABCREA --  CREATININE 1.09   Estimated Creatinine Clearance: 104.8 ml/min (by C-G formula based on Cr of 1.09).  Basename 01/15/12 1930 01/14/12 1820  VANCOTROUGH 21.9* 10.3  VANCOPEAK -- --  Drue Dun -- --  GENTTROUGH -- --  GENTPEAK -- --  GENTRANDOM -- --  TOBRATROUGH -- --  TOBRAPEAK -- --  TOBRARND -- --  AMIKACINPEAK -- --  AMIKACINTROU -- --  AMIKACIN -- --     Microbiology: Recent Results (from the past 720 hour(s))  CULTURE, BLOOD (ROUTINE X 2)     Status: Normal (Preliminary result)   Collection Time   01/12/12  5:45 PM      Component Value Range Status Comment   Specimen Description BLOOD ARM RIGHT   Final    Special Requests BOTTLES DRAWN AEROBIC AND ANAEROBIC 10CC   Final    Culture  Setup Time 01/13/2012 00:47   Final    Culture     Final    Value:        BLOOD CULTURE RECEIVED NO GROWTH TO DATE CULTURE WILL BE HELD FOR 5 DAYS BEFORE ISSUING A FINAL NEGATIVE REPORT   Report Status PENDING   Incomplete   CULTURE, BLOOD (ROUTINE X 2)     Status: Normal (Preliminary result)   Collection Time   01/12/12  6:06 PM      Component Value Range Status Comment   Specimen Description BLOOD HAND RIGHT   Final    Special Requests BOTTLES DRAWN AEROBIC ONLY 10CC   Final    Culture  Setup Time  01/13/2012 00:48   Final    Culture     Final    Value:        BLOOD CULTURE RECEIVED NO GROWTH TO DATE CULTURE WILL BE HELD FOR 5 DAYS BEFORE ISSUING A FINAL NEGATIVE REPORT   Report Status PENDING   Incomplete   SURGICAL PCR SCREEN     Status: Normal   Collection Time   01/13/12  5:52 AM      Component Value Range Status Comment   MRSA, PCR NEGATIVE  NEGATIVE Final    Staphylococcus aureus NEGATIVE  NEGATIVE Final   CULTURE, ROUTINE-ABSCESS     Status: Normal (Preliminary result)   Collection Time   01/13/12  1:12 PM      Component Value Range Status Comment   Specimen Description ABSCESS   Final    Special Requests LUMBAR DISC L4 5   Final    Gram Stain     Final    Value: RARE WBC PRESENT, PREDOMINANTLY PMN     RARE SQUAMOUS EPITHELIAL CELLS PRESENT     FEW GRAM POSITIVE COCCI IN PAIRS   Culture NO GROWTH 1 DAY   Final    Report Status PENDING  Incomplete   FUNGUS CULTURE W SMEAR     Status: Normal (Preliminary result)   Collection Time   01/13/12  1:12 PM      Component Value Range Status Comment   Specimen Description ABSCESS   Final    Special Requests LUMBAR DIS L4 5   Final    Fungal Smear NO YEAST OR FUNGAL ELEMENTS SEEN   Final    Culture CULTURE IN PROGRESS FOR FOUR WEEKS   Final    Report Status PENDING   Incomplete     Anti-infectives     Start     Dose/Rate Route Frequency Ordered Stop   01/14/12 2000   vancomycin (VANCOCIN) 1,250 mg in sodium chloride 0.9 % 250 mL IVPB        1,250 mg 166.7 mL/hr over 90 Minutes Intravenous Every 8 hours 01/14/12 1928     01/13/12 1800   cefTRIAXone (ROCEPHIN) 2 g in dextrose 5 % 50 mL IVPB        2 g 100 mL/hr over 30 Minutes Intravenous Every 24 hours 01/12/12 1916     01/13/12 0730   vancomycin (VANCOCIN) 1,250 mg in sodium chloride 0.9 % 250 mL IVPB  Status:  Discontinued        1,250 mg 166.7 mL/hr over 90 Minutes Intravenous Every 12 hours 01/12/12 1916 01/14/12 1928   01/12/12 2200   rifampin (RIFADIN) capsule 300 mg         300 mg Oral Every 12 hours 01/12/12 1804     01/12/12 1930   vancomycin (VANCOCIN) 2,500 mg in sodium chloride 0.9 % 500 mL IVPB        2,500 mg 250 mL/hr over 120 Minutes Intravenous  Once 01/12/12 1916 01/13/12 0007   01/12/12 1800   cefTRIAXone (ROCEPHIN) 2 g in dextrose 5 % 50 mL IVPB        2 g 100 mL/hr over 30 Minutes Intravenous  Once 01/12/12 1757 01/12/12 1900   01/12/12 1800   vancomycin (VANCOCIN) IVPB 1000 mg/200 mL premix  Status:  Discontinued        1,000 mg 200 mL/hr over 60 Minutes Intravenous  Once 01/12/12 1758 01/12/12 1839          Assessment: Patient is a 62 y.o M on vancomycin for diskitis/osteo.  Last vancomycin checked on 1/10 was subtherapeutic at 10.3 with dose increased to 1250mg  IV q8h (from 1250mg  q12h). New trough level with q8h frequency now back slightly above goal at 21.9.    Goal of Therapy:  Vancomycin trough level 15-20 mcg/ml  Plan:  1) change vancomycin to 1000mg  IV q8h   Meia Emley P 01/15/2012,9:30 PM

## 2012-01-15 NOTE — Progress Notes (Signed)
Subjective: Patient resting in bed, explains that he is having greater pain today than yesterday. Continues on rifampin, vancomycin, and Rocephin.  Objective: Vital signs in last 24 hours: Filed Vitals:   01/14/12 0540 01/14/12 1040 01/14/12 1433 01/14/12 2241  BP: 123/66 125/65 115/69 106/65  Pulse: 78 73 73 67  Temp: 98.8 F (37.1 C) 98.1 F (36.7 C) 98.5 F (36.9 C) 97.8 F (36.6 C)  TempSrc: Oral   Oral  Resp: 18 20 20 18   Height:      Weight:      SpO2: 98% 99% 100% 97%    Intake/Output from previous day: 01/10 0701 - 01/11 0700 In: 1080 [P.O.:1080] Out: 350 [Urine:350] Intake/Output this shift: Total I/O In: 240 [P.O.:240] Out: -   Physical Exam:  Moving all 4 extremities well.   CBC  Basename 01/12/12 1740  WBC 7.8  HGB 12.8*  HCT 37.4*  PLT 217   BMET  Basename 01/13/12 1635 01/12/12 1740  NA 134* 128*  K 3.9 3.4*  CL 96 93*  CO2 24 23  GLUCOSE 113* 168*  BUN 14 19  CREATININE 1.09 1.55*  CALCIUM 9.2 9.5    Assessment/Plan: Encouraged ambulation in halls.    Hewitt Shorts, MD 01/15/2012, 10:03 AM

## 2012-01-16 LAB — GLUCOSE, CAPILLARY: Glucose-Capillary: 152 mg/dL — ABNORMAL HIGH (ref 70–99)

## 2012-01-16 MED ORDER — ADULT MULTIVITAMIN W/MINERALS CH
1.0000 | ORAL_TABLET | Freq: Every day | ORAL | Status: DC
  Administered 2012-01-16 – 2012-01-19 (×4): 1 via ORAL
  Filled 2012-01-16 (×4): qty 1

## 2012-01-16 NOTE — Progress Notes (Signed)
Orthopedic Tech Progress Note Patient Details:  Charles Navarro [birth year] [id number]  Patient ID: Charles Navarro, male   DOB: 11-29-1950, 62 y.o.   MRN: [id number]   Charles Navarro 01/16/2012, 4:39 PM TLSO COMPLETED BY [facility].

## 2012-01-16 NOTE — Progress Notes (Signed)
Orthopedic Tech Progress Note Patient Details:  Charles Navarro [birth year] [id number]  Patient ID: [name], male   DOB: Sep 01, 1950, 62 y.o.   MRN: [id number]   Shawnie Pons 01/16/2012, 11:10 AM CALLED [facility] FOR TLSO BRACE.

## 2012-01-16 NOTE — Progress Notes (Signed)
Prune Juice given per patient request to assist with bowel movement.

## 2012-01-16 NOTE — Progress Notes (Signed)
Patient ID: Charles Navarro, male   DOB: [birth year], 62 y.o.   MRN: [id number] Still having a lot of lumbar pain, no weakness. Spoke with him about discitis and prognosis

## 2012-01-17 ENCOUNTER — Encounter (HOSPITAL_COMMUNITY): Payer: Self-pay | Admitting: Infectious Diseases

## 2012-01-17 DIAGNOSIS — M869 Osteomyelitis, unspecified: Principal | ICD-10-CM

## 2012-01-17 LAB — SEDIMENTATION RATE: Sed Rate: 52 mm/hr — ABNORMAL HIGH (ref 0–16)

## 2012-01-17 LAB — VANCOMYCIN, TROUGH: Vancomycin Tr: 17.8 ug/mL (ref 10.0–20.0)

## 2012-01-17 MED ORDER — PANTOPRAZOLE SODIUM 40 MG PO TBEC
40.0000 mg | DELAYED_RELEASE_TABLET | Freq: Every day | ORAL | Status: DC
  Administered 2012-01-17 – 2012-01-18 (×2): 40 mg via ORAL
  Filled 2012-01-17 (×2): qty 1

## 2012-01-17 NOTE — Progress Notes (Signed)
   CARE MANAGEMENT NOTE 01/17/2012  Patient:  ZYMERE, PATLAN   Account Number:  [id number]  Date Initiated:  01/17/2012  Documentation initiated by:  [facility]  Subjective/Objective Assessment:   MRI reveals Lumbar 4-5 diskitis     Action/Plan:   HH, lives at home with wife. She is teachable and can help with IV abx.   Anticipated DC Date:  01/17/2012   Anticipated DC Plan:  HOME W HOME HEALTH SERVICES      DC Planning Services  CM consult      [facility] Choice  HOME HEALTH   Choice offered to / List presented to:  C-1 Patient           [facility] agency  [facility]   Status of service:  In process, will continue to follow Medicare Important Message given?   (If response is "NO", the following Medicare IM given date fields will be blank) Date Medicare IM given:   Date Additional Medicare IM given:    Discharge Disposition:    Per UR Regulation:    If discussed at Long Length of Stay Meetings, dates discussed:    Comments:  01/17/2011 1045 NCM spoke to pt and offered choice for [facility]. Requested [facility] for [facility] RN for possible d/c home with IV abx. Referral sent to [facility], [facility] rep for [facility]. Isidoro Donning RN CCM Case Mgmt phone [phone]

## 2012-01-17 NOTE — Progress Notes (Signed)
ANTIBIOTIC CONSULT NOTE - FOLLOW UP  Pharmacy Consult for Vancomycin Indication: L4-5 diskitis/osteomyelitis   Allergies  Allergen Reactions  . Codeine Nausea And Vomiting    dizziness  . Tramadol Nausea And Vomiting    Patient Measurements: Height: 6\' 6"  (198.1 cm) Weight: 271 lb 6.2 oz (123.1 kg) IBW/kg (Calculated) : 91.4   Vital Signs: Temp: 97.8 F (36.6 C) (01/13 1427) Temp src: Oral (01/13 1427) BP: 120/64 mmHg (01/13 1427) Pulse Rate: 80  (01/13 1427) Intake/Output from previous day: 01/12 0701 - 01/13 0700 In: 1450 [P.O.:1200; I.V.:200; IV Piggyback:50] Out: 250 [Urine:250] Intake/Output from this shift: Total I/O In: 720 [P.O.:720] Out: -   Labs: No results found for this basename: WBC:3,HGB:3,PLT:3,LABCREA:3,CREATININE:3, in the last 72 hours Estimated Creatinine Clearance: 104.8 ml/min (by C-G formula based on Cr of 1.09).  Basename 01/17/12 1515 01/15/12 1930  VANCOTROUGH 17.8 21.9*  VANCOPEAK -- --  Drue Dun -- --  GENTTROUGH -- --  GENTPEAK -- --  GENTRANDOM -- --  TOBRATROUGH -- --  TOBRAPEAK -- --  TOBRARND -- --  AMIKACINPEAK -- --  AMIKACINTROU -- --  AMIKACIN -- --     Microbiology: Recent Results (from the past 720 hour(s))  CULTURE, BLOOD (ROUTINE X 2)     Status: Normal (Preliminary result)   Collection Time   01/12/12  5:45 PM      Component Value Range Status Comment   Specimen Description BLOOD ARM RIGHT   Final    Special Requests BOTTLES DRAWN AEROBIC AND ANAEROBIC 10CC   Final    Culture  Setup Time 01/13/2012 00:47   Final    Culture     Final    Value:        BLOOD CULTURE RECEIVED NO GROWTH TO DATE CULTURE WILL BE HELD FOR 5 DAYS BEFORE ISSUING A FINAL NEGATIVE REPORT   Report Status PENDING   Incomplete   CULTURE, BLOOD (ROUTINE X 2)     Status: Normal (Preliminary result)   Collection Time   01/12/12  6:06 PM      Component Value Range Status Comment   Specimen Description BLOOD HAND RIGHT   Final    Special  Requests BOTTLES DRAWN AEROBIC ONLY 10CC   Final    Culture  Setup Time 01/13/2012 00:48   Final    Culture     Final    Value:        BLOOD CULTURE RECEIVED NO GROWTH TO DATE CULTURE WILL BE HELD FOR 5 DAYS BEFORE ISSUING A FINAL NEGATIVE REPORT   Report Status PENDING   Incomplete   SURGICAL PCR SCREEN     Status: Normal   Collection Time   01/13/12  5:52 AM      Component Value Range Status Comment   MRSA, PCR NEGATIVE  NEGATIVE Final    Staphylococcus aureus NEGATIVE  NEGATIVE Final   CULTURE, ROUTINE-ABSCESS     Status: Normal (Preliminary result)   Collection Time   01/13/12  1:12 PM      Component Value Range Status Comment   Specimen Description ABSCESS   Final    Special Requests LUMBAR DISC L4 5   Final    Gram Stain     Final    Value: RARE WBC PRESENT, PREDOMINANTLY PMN     RARE SQUAMOUS EPITHELIAL CELLS PRESENT     FEW GRAM POSITIVE COCCI IN PAIRS   Culture NO GROWTH 3 DAYS   Final    Report Status PENDING  Incomplete   FUNGUS CULTURE W SMEAR     Status: Normal (Preliminary result)   Collection Time   01/13/12  1:12 PM      Component Value Range Status Comment   Specimen Description ABSCESS   Final    Special Requests LUMBAR DIS L4 5   Final    Fungal Smear NO YEAST OR FUNGAL ELEMENTS SEEN   Final    Culture CULTURE IN PROGRESS FOR FOUR WEEKS   Final    Report Status PENDING   Incomplete     Anti-infectives     Start     Dose/Rate Route Frequency Ordered Stop   01/15/12 2200   vancomycin (VANCOCIN) IVPB 1000 mg/200 mL premix        1,000 mg 200 mL/hr over 60 Minutes Intravenous Every 8 hours 01/15/12 2150     01/14/12 2000   vancomycin (VANCOCIN) 1,250 mg in sodium chloride 0.9 % 250 mL IVPB  Status:  Discontinued        1,250 mg 166.7 mL/hr over 90 Minutes Intravenous Every 8 hours 01/14/12 1928 01/15/12 2149   01/13/12 1800   cefTRIAXone (ROCEPHIN) 2 g in dextrose 5 % 50 mL IVPB        2 g 100 mL/hr over 30 Minutes Intravenous Every 24 hours 01/12/12 1916      01/13/12 0730   vancomycin (VANCOCIN) 1,250 mg in sodium chloride 0.9 % 250 mL IVPB  Status:  Discontinued        1,250 mg 166.7 mL/hr over 90 Minutes Intravenous Every 12 hours 01/12/12 1916 01/14/12 1928   01/12/12 2200   rifampin (RIFADIN) capsule 300 mg  Status:  Discontinued        300 mg Oral Every 12 hours 01/12/12 1804 01/17/12 1501   01/12/12 1930   vancomycin (VANCOCIN) 2,500 mg in sodium chloride 0.9 % 500 mL IVPB        2,500 mg 250 mL/hr over 120 Minutes Intravenous  Once 01/12/12 1916 01/13/12 0007   01/12/12 1800   cefTRIAXone (ROCEPHIN) 2 g in dextrose 5 % 50 mL IVPB        2 g 100 mL/hr over 30 Minutes Intravenous  Once 01/12/12 1757 01/12/12 1900   01/12/12 1800   vancomycin (VANCOCIN) IVPB 1000 mg/200 mL premix  Status:  Discontinued        1,000 mg 200 mL/hr over 60 Minutes Intravenous  Once 01/12/12 1758 01/12/12 1839          Assessment: 62 y.o. M who continues on Vancomycin for L4-5 diskitis/osteo. Per ID, to continue with Vancomycin and Rocephin at this time and to be followed up as an outpatient at the ID clinic. Vancomycin trough this evening was drawn ~1 hour late. Drawn trough is 17.8 mcg/ml, estimated true trough is slightly higher at ~19 mcg/ml -- however still within goal range of 15-25 mcg/ml.  At this time, will plan to continue at current dosing of 1g every 8 hours. Given the patient's variable renal function this admission with both high and low troughs -- would consider checking weekly levels as an outpatient.   Goal of Therapy:  Vancomycin trough level 15-20 mcg/ml  Plan:  1. Continue Vancomycin 1g IV every 8 hours 2. Will continue to follow renal function, culture results, LOT, and antibiotic de-escalation plans   Georgina Pillion, PharmD, BCPS Clinical Pharmacist Pager: [phone] 01/17/2012 4:53 PM

## 2012-01-17 NOTE — Progress Notes (Signed)
Subjective: Patient reports days feeling better still a lot of back pain but asymptomatic from a radicular perspective Objective: Vital signs in last 24 hours: Temp:  [98 F (36.7 C)-98.2 F (36.8 C)] 98 F (36.7 C) (01/13 0507) Pulse Rate:  [71-73] 72  (01/13 0507) Resp:  [18-20] 20  (01/13 0507) BP: (114-122)/(62-80) 114/80 mmHg (01/13 0507) SpO2:  [97 %-100 %] 97 % (01/13 0507)  Intake/Output from previous day: 01/12 0701 - 01/13 0700 In: 1450 [P.O.:1200; I.V.:200; IV Piggyback:50] Out: 250 [Urine:250] Intake/Output this shift: Total I/O In: 360 [P.O.:360] Out: -   Awake alert oriented strength out of 5  Lab Results: No results found for this basename: WBC:2,HGB:2,HCT:2,PLT:2 in the last 72 hours BMET No results found for this basename: NA:2,K:2,CL:2,CO2:2,GLUCOSE:2,BUN:2,CREATININE:2,CALCIUM:2 in the last 72 hours  Studies/Results: No results found.  Assessment/Plan: Continue to await cultures all results negative so far Gram stain on the CT aspirate showed gram-positive cocci in pairs he is on staff coverage with vancomycin rifampin and Rocephin. I will continue to let mobilized today I will consult infectious disease and we'll plan on problems discharge tomorrow on home health IV  antibiotics  LOS: 5 days     Aldine Chakraborty P 01/17/2012, 10:16 AM

## 2012-01-17 NOTE — Consult Note (Signed)
INFECTIOUS DISEASE CONSULT NOTE  Date of Admission:  01/12/2012  Date of Consult:  01/17/2012  Reason for Consult: Osteomyelitis of L4-5 Referring Physician: Wynetta Emery  Impression/Recommendation: Osteomyelitis of L4-5 Previous L4-5 laminectomy and discectomy 11/08/2011 (no hardware)  Would- await his Cx Continue vanco/ceftriaxone D/c rfiampin  Comment- at this point we will continue him on vancomycin and ceftriaxone. His Gram stain would be suggestive of strep however a culture would be useful to confirm this. Since he did not have prosthetics placed at the time of his surgery, do not feel he needs rifampin at this time. Will see him in the infectious disease clinic for followup.  Thank you so much for this interesting consult,   Charles Navarro [phone]  Charles Navarro is an 62 y.o. male.  HPI: 61 year old male with history of hypertension and a L4-5 laminectomy decompression microdiscectomy on 11/08/2011. Postoperatively he did well however he developed worsening back pain. He underwent a followup MRI on January 7 showing "discitis and osteomyelitis at L4-5 with a small paraspinal phlegmon." There was fluid and soft tissue protruding from the disc space compressing the thecal sac sac and both lateral recesses. He was admitted to the hospital on January 9 and underwent L4-5 disc aspiration by interventional radiology. His Gram stain showed few gram-positive cocci in pairs, cultures pending. His sedimentation rate is 44.   Past Medical History  Diagnosis Date  . Hypertension     takes meds daily  . Depression   . Vertigo   . Bronchitis     hx of    Past Surgical History  Procedure Date  . Colonoscopy   . Wisdom tooth extraction   . Lumbar laminectomy/decompression microdiscectomy 11/08/2011    Procedure: LUMBAR LAMINECTOMY/DECOMPRESSION MICRODISCECTOMY 1 LEVEL;  Surgeon: Mariam Dollar, MD;  Location: [facility];  Service: Neurosurgery;  Laterality: Left;  left lumbar four-five  decompression laminectomy microdiscectomy     Allergies  Allergen Reactions  . Codeine Nausea And Vomiting    dizziness  . Tramadol Nausea And Vomiting    Medications:  Scheduled:    . cefTRIAXone (ROCEPHIN)  IV  2 g Intravenous Q24H  . docusate sodium  100 mg Oral BID  . hydrochlorothiazide  12.5 mg Oral Daily  . lisinopril  10 mg Oral Daily  . multivitamin with minerals  1 tablet Oral Daily  . pantoprazole (PROTONIX) IV  40 mg Intravenous QHS  . rifampin  300 mg Oral Q12H  . sodium chloride  3 mL Intravenous Q12H  . vancomycin  1,000 mg Intravenous Q8H    Total days of antibiotics: 6 (vanco/ceftriaxone/rfiampin)  Social History:  reports that he has never smoked. He does not have any smokeless tobacco history on file. He reports that he does not drink alcohol or use illicit drugs.  History reviewed. No pertinent family history.  General ROS: Intentional 50 pound weight loss. No dysphagia. No fever or chills. Normal urination. No paresthesias lower extremity. Normal wound healing, no discharge or bleeding. Constipation. See history of present illness.  Blood pressure 120/64, pulse 80, temperature 97.8 F (36.6 C), temperature source Oral, resp. rate 20, height 6\' 6"  (1.981 m), weight 123.1 kg (271 lb 6.2 oz), SpO2 98.00%. General appearance: alert, cooperative and no distress Eyes: negative findings: pupils equal, round, reactive to light and accomodation Throat: normal findings: oropharynx pink & moist without lesions or evidence of thrush Neck: no adenopathy and supple, symmetrical, trachea midline Lungs: clear to auscultation bilaterally Heart: regular rate and rhythm Abdomen: normal  findings: bowel sounds normal and soft, non-tender Neurologic: Sensory: Normal light touch lower extremities. Motor: Normal plantar flexion and dorsi flexion bilateral lower extremities. His back wound is clean and well-healed. Nontender. No fluctuance.   Results for orders placed  during the hospital encounter of 01/12/12 (from the past 48 hour(s))  VANCOMYCIN, TROUGH     Status: Abnormal   Collection Time   01/15/12  7:30 PM      Component Value Range Comment   Vancomycin Tr 21.9 (*) 10.0 - 20.0 ug/mL   GLUCOSE, CAPILLARY     Status: Abnormal   Collection Time   01/16/12  7:24 AM      Component Value Range Comment   Glucose-Capillary 152 (*) 70 - 99 mg/dL    Comment 1 Documented in Chart      Comment 2 Notify RN         Component Value Date/Time   SDES ABSCESS 01/13/2012 1312   SDES ABSCESS 01/13/2012 1312   SPECREQUEST LUMBAR DISC L4 5 01/13/2012 1312   SPECREQUEST LUMBAR DIS L4 5 01/13/2012 1312   CULT NO GROWTH 3 DAYS 01/13/2012 1312   CULT CULTURE IN PROGRESS FOR FOUR WEEKS 01/13/2012 1312   REPTSTATUS PENDING 01/13/2012 1312   REPTSTATUS PENDING 01/13/2012 1312   No results found. Recent Results (from the past 240 hour(s))  CULTURE, BLOOD (ROUTINE X 2)     Status: Normal (Preliminary result)   Collection Time   01/12/12  5:45 PM      Component Value Range Status Comment   Specimen Description BLOOD ARM RIGHT   Final    Special Requests BOTTLES DRAWN AEROBIC AND ANAEROBIC 10CC   Final    Culture  Setup Time 01/13/2012 00:47   Final    Culture     Final    Value:        BLOOD CULTURE RECEIVED NO GROWTH TO DATE CULTURE WILL BE HELD FOR 5 DAYS BEFORE ISSUING A FINAL NEGATIVE REPORT   Report Status PENDING   Incomplete   CULTURE, BLOOD (ROUTINE X 2)     Status: Normal (Preliminary result)   Collection Time   01/12/12  6:06 PM      Component Value Range Status Comment   Specimen Description BLOOD HAND RIGHT   Final    Special Requests BOTTLES DRAWN AEROBIC ONLY 10CC   Final    Culture  Setup Time 01/13/2012 00:48   Final    Culture     Final    Value:        BLOOD CULTURE RECEIVED NO GROWTH TO DATE CULTURE WILL BE HELD FOR 5 DAYS BEFORE ISSUING A FINAL NEGATIVE REPORT   Report Status PENDING   Incomplete   SURGICAL PCR SCREEN     Status: Normal   Collection Time    01/13/12  5:52 AM      Component Value Range Status Comment   MRSA, PCR NEGATIVE  NEGATIVE Final    Staphylococcus aureus NEGATIVE  NEGATIVE Final   CULTURE, ROUTINE-ABSCESS     Status: Normal (Preliminary result)   Collection Time   01/13/12  1:12 PM      Component Value Range Status Comment   Specimen Description ABSCESS   Final    Special Requests LUMBAR DISC L4 5   Final    Gram Stain     Final    Value: RARE WBC PRESENT, PREDOMINANTLY PMN     RARE SQUAMOUS EPITHELIAL CELLS PRESENT     FEW  GRAM POSITIVE COCCI IN PAIRS   Culture NO GROWTH 3 DAYS   Final    Report Status PENDING   Incomplete   FUNGUS CULTURE W SMEAR     Status: Normal (Preliminary result)   Collection Time   01/13/12  1:12 PM      Component Value Range Status Comment   Specimen Description ABSCESS   Final    Special Requests LUMBAR DIS L4 5   Final    Fungal Smear NO YEAST OR FUNGAL ELEMENTS SEEN   Final    Culture CULTURE IN PROGRESS FOR FOUR WEEKS   Final    Report Status PENDING   Incomplete       01/17/2012, 2:55 PM     LOS: 5 days

## 2012-01-18 LAB — HEPATITIS PANEL, ACUTE
HCV Ab: NEGATIVE
Hep A IgM: NEGATIVE
Hep B C IgM: NEGATIVE
Hepatitis B Surface Ag: NEGATIVE

## 2012-01-18 LAB — CULTURE, ROUTINE-ABSCESS: Culture: NO GROWTH

## 2012-01-18 LAB — HIV ANTIBODY (ROUTINE TESTING W REFLEX): HIV: NONREACTIVE

## 2012-01-18 NOTE — Progress Notes (Signed)
Subjective: Patient reports Is not a lot better and much better today yesterday much better night last night  Objective: Vital signs in last 24 hours: Temp:  [97.5 F (36.4 C)-99 F (37.2 C)] 97.5 F (36.4 C) (01/14 0600) Pulse Rate:  [71-80] 71  (01/14 0600) Resp:  [18-20] 18  (01/14 0600) BP: (115-138)/(64-78) 121/73 mmHg (01/14 0600) SpO2:  [98 %] 98 % (01/14 0600)  Intake/Output from previous day: 01/13 0701 - 01/14 0700 In: 960 [P.O.:960] Out: -  Intake/Output this shift:    Strength is 5 out of 5  Lab Results: No results found for this basename: WBC:2,HGB:2,HCT:2,PLT:2 in the last 72 hours BMET No results found for this basename: NA:2,K:2,CL:2,CO2:2,GLUCOSE:2,BUN:2,CREATININE:2,CALCIUM:2 in the last 72 hours  Studies/Results: No results found.  Assessment/Plan: Hospital day 5 on IV MRI X. culture still negative appreciate infectious disease recommendations. We'll plan discharge the patient on home IV antibiotics when the final ID Recs are available. Set recheck yesterday was mildly elevated at 55  LOS: 6 days     Charles Navarro P 01/18/2012, 8:05 AM

## 2012-01-18 NOTE — Progress Notes (Signed)
INFECTIOUS DISEASE PROGRESS NOTE  ID: Charles Navarro is a 62 y.o. male with   Active Problems:  * No active hospital problems. *   Subjective: Without complaints  Abtx:  Anti-infectives     Start     Dose/Rate Route Frequency Ordered Stop   01/15/12 2200   vancomycin (VANCOCIN) IVPB 1000 mg/200 mL premix        1,000 mg 200 mL/hr over 60 Minutes Intravenous Every 8 hours 01/15/12 2150     01/14/12 2000   vancomycin (VANCOCIN) 1,250 mg in sodium chloride 0.9 % 250 mL IVPB  Status:  Discontinued        1,250 mg 166.7 mL/hr over 90 Minutes Intravenous Every 8 hours 01/14/12 1928 01/15/12 2149   01/13/12 1800   cefTRIAXone (ROCEPHIN) 2 g in dextrose 5 % 50 mL IVPB        2 g 100 mL/hr over 30 Minutes Intravenous Every 24 hours 01/12/12 1916     01/13/12 0730   vancomycin (VANCOCIN) 1,250 mg in sodium chloride 0.9 % 250 mL IVPB  Status:  Discontinued        1,250 mg 166.7 mL/hr over 90 Minutes Intravenous Every 12 hours 01/12/12 1916 01/14/12 1928   01/12/12 2200   rifampin (RIFADIN) capsule 300 mg  Status:  Discontinued        300 mg Oral Every 12 hours 01/12/12 1804 01/17/12 1501   01/12/12 1930   vancomycin (VANCOCIN) 2,500 mg in sodium chloride 0.9 % 500 mL IVPB        2,500 mg 250 mL/hr over 120 Minutes Intravenous  Once 01/12/12 1916 01/13/12 0007   01/12/12 1800   cefTRIAXone (ROCEPHIN) 2 g in dextrose 5 % 50 mL IVPB        2 g 100 mL/hr over 30 Minutes Intravenous  Once 01/12/12 1757 01/12/12 1900   01/12/12 1800   vancomycin (VANCOCIN) IVPB 1000 mg/200 mL premix  Status:  Discontinued        1,000 mg 200 mL/hr over 60 Minutes Intravenous  Once 01/12/12 1758 01/12/12 1839          Medications:  Scheduled:   . cefTRIAXone (ROCEPHIN)  IV  2 g Intravenous Q24H  . docusate sodium  100 mg Oral BID  . hydrochlorothiazide  12.5 mg Oral Daily  . lisinopril  10 mg Oral Daily  . multivitamin with minerals  1 tablet Oral Daily  . pantoprazole  40 mg Oral Daily  .  sodium chloride  3 mL Intravenous Q12H  . vancomycin  1,000 mg Intravenous Q8H    Objective: Vital signs in last 24 hours: Temp:  [97.5 F (36.4 C)-99 F (37.2 C)] 98.8 F (37.1 C) (01/14 1448) Pulse Rate:  [64-78] 78  (01/14 1448) Resp:  [17-18] 17  (01/14 1448) BP: (115-125)/(66-73) 116/67 mmHg (01/14 1448) SpO2:  [98 %-100 %] 100 % (01/14 1448)   General appearance: alert, cooperative and no distress Incision/Wound: clean, non-tender, no fluctuance.  Lab Results No results found for this basename: WBC:2,HGB:2,HCT:2,PLATELETS:2,NA:2,K:2,CL:2,CO2:2,BUN:2,CREATININE:2,GLU:2 in the last 72 hours Liver Panel No results found for this basename: PROT:2,ALBUMIN:2,AST:2,ALT:2,ALKPHOS:2,BILITOT:2,BILIDIR:2,IBILI:2 in the last 72 hours Sedimentation Rate  Basename 01/17/12 1515  ESRSEDRATE 52*   C-Reactive Protein No results found for this basename: CRP:2 in the last 72 hours  Microbiology: Recent Results (from the past 240 hour(s))  CULTURE, BLOOD (ROUTINE X 2)     Status: Normal (Preliminary result)   Collection Time   01/12/12  5:45 PM  Component Value Range Status Comment   Specimen Description BLOOD ARM RIGHT   Final    Special Requests BOTTLES DRAWN AEROBIC AND ANAEROBIC 10CC   Final    Culture  Setup Time 01/13/2012 00:47   Final    Culture     Final    Value:        BLOOD CULTURE RECEIVED NO GROWTH TO DATE CULTURE WILL BE HELD FOR 5 DAYS BEFORE ISSUING A FINAL NEGATIVE REPORT   Report Status PENDING   Incomplete   CULTURE, BLOOD (ROUTINE X 2)     Status: Normal (Preliminary result)   Collection Time   01/12/12  6:06 PM      Component Value Range Status Comment   Specimen Description BLOOD HAND RIGHT   Final    Special Requests BOTTLES DRAWN AEROBIC ONLY 10CC   Final    Culture  Setup Time 01/13/2012 00:48   Final    Culture     Final    Value:        BLOOD CULTURE RECEIVED NO GROWTH TO DATE CULTURE WILL BE HELD FOR 5 DAYS BEFORE ISSUING A FINAL NEGATIVE REPORT    Report Status PENDING   Incomplete   SURGICAL PCR SCREEN     Status: Normal   Collection Time   01/13/12  5:52 AM      Component Value Range Status Comment   MRSA, PCR NEGATIVE  NEGATIVE Final    Staphylococcus aureus NEGATIVE  NEGATIVE Final   CULTURE, ROUTINE-ABSCESS     Status: Normal   Collection Time   01/13/12  1:12 PM      Component Value Range Status Comment   Specimen Description ABSCESS   Final    Special Requests LUMBAR DISC L4 5   Final    Gram Stain     Final    Value: RARE WBC PRESENT, PREDOMINANTLY PMN     RARE SQUAMOUS EPITHELIAL CELLS PRESENT     FEW GRAM POSITIVE COCCI IN PAIRS   Culture NO GROWTH 3 DAYS   Final    Report Status 01/18/2012 FINAL   Final   FUNGUS CULTURE W SMEAR     Status: Normal (Preliminary result)   Collection Time   01/13/12  1:12 PM      Component Value Range Status Comment   Specimen Description ABSCESS   Final    Special Requests LUMBAR DIS L4 5   Final    Fungal Smear NO YEAST OR FUNGAL ELEMENTS SEEN   Final    Culture CULTURE IN PROGRESS FOR FOUR WEEKS   Final    Report Status PENDING   Incomplete     Studies/Results: No results found.   Assessment/Plan: Osteomyelitis of L4-5 spine Cx negative (GPC on g/s) Would plan on 6 weeks of IV vanco and ceftriaxone.  Will see him back in ID clinic in 2 weeks.   Total days of antibiotics: 6   Charles Navarro Infectious Diseases [phone] 01/18/2012, 2:59 PM   LOS: 6 days

## 2012-01-19 LAB — CULTURE, BLOOD (ROUTINE X 2)
Culture: NO GROWTH
Culture: NO GROWTH

## 2012-01-19 MED ORDER — CYCLOBENZAPRINE HCL 10 MG PO TABS: 10.0000 mg | ORAL_TABLET | Freq: Three times a day (TID) | ORAL | Status: DC | PRN

## 2012-01-19 MED ORDER — VANCOMYCIN HCL IN DEXTROSE 1-5 GM/200ML-% IV SOLN: 1000.0000 mg | Freq: Three times a day (TID) | INTRAVENOUS | Status: DC

## 2012-01-19 MED ORDER — HEPARIN SOD (PORK) LOCK FLUSH 100 UNIT/ML IV SOLN
250.0000 [IU] | INTRAVENOUS | Status: AC | PRN
  Administered 2012-01-19: 250 [IU]

## 2012-01-19 MED ORDER — OXYCODONE-ACETAMINOPHEN 5-325 MG PO TABS: 1.0000 | ORAL_TABLET | ORAL | Status: DC | PRN

## 2012-01-19 MED ORDER — DEXTROSE 5 % IV SOLN: 2.0000 g | INTRAVENOUS | Status: DC

## 2012-01-19 NOTE — Progress Notes (Signed)
Subjective: Patient reports He is feeling okay still has a lot of back pain but no leg pain  Objective: Vital signs in last 24 hours: Temp:  [97.5 F (36.4 C)-98.8 F (37.1 C)] 97.5 F (36.4 C) (01/15 0624) Pulse Rate:  [64-84] 71  (01/15 0624) Resp:  [17-20] 20  (01/15 0624) BP: (116-133)/(67-80) 133/75 mmHg (01/15 0624) SpO2:  [99 %-100 %] 99 % (01/15 0624)  Intake/Output from previous day:   Intake/Output this shift:    Neurologically intact  Lab Results: No results found for this basename: WBC:2,HGB:2,HCT:2,PLT:2 in the last 72 hours BMET No results found for this basename: NA:2,K:2,CL:2,CO2:2,GLUCOSE:2,BUN:2,CREATININE:2,CALCIUM:2 in the last 72 hours  Studies/Results: No results found.  Assessment/Plan: Discharged home on home health IV antibiotics  LOS: 7 days     [place] Behlke P 01/19/2012, 8:35 AM

## 2012-01-19 NOTE — Discharge Summary (Signed)
Physician Discharge Summary  Patient ID: Charles Navarro MRN: [id number] DOB/AGE: August 26, 1950 62 y.o.  Admit date: 01/12/2012 Discharge date: 01/19/2012  Admission Diagnoses: L4-5 discitis osteomyelitis  Discharge Diagnoses: Same Active Problems:  * No active hospital problems. *    Discharged Condition: good  Hospital Course: Patient was admitted emergently through the ER with an MRI scan showing suggestion of osteomyelitis and discitis patient was admitted blood cultures retained root were obtained patient was placed on thank Rocephin and rifampin CT aspiration of the disc space also was obtained of the cultures grow anything positive there was a Gram stain and given him to staff with gram-positive cocci in pairs off of the disc space aspirate. However nothing growing cultures either from the disc space or from the blood. Patient was seen by infectious disease antibiotics were tailored to include Bankart Rocephin patient will be discharged on home health thank Rocephin for at least 6-8 weeks. We will follow the C-reactive proteins and sed rates his markers. Patient will follow up with Dr. Enedina Finner as well as myself in the next 2 weeks.  Consults: Significant Diagnostic Studies: Treatments: CT aspiration of L4-5 disc space Discharge Exam: Blood pressure 133/75, pulse 71, temperature 97.5 F (36.4 C), temperature source Oral, resp. rate 20, height 6\' 6"  (1.981 m), weight 123.1 kg (271 lb 6.2 oz), SpO2 99.00%. Strength out of 5 wound clean and dry  Disposition: Home  Discharge Orders    Future Appointments: Provider: Department: Dept Phone: Center:   01/31/2012 3:00 PM Cliffton Asters, MD [facility] [phone] [facility]       Medication List     As of 01/19/2012  8:38 AM    TAKE these medications         aspirin EC 81 MG tablet   Take 81 mg by mouth daily.      cyclobenzaprine 10 MG tablet   Commonly known as: FLEXERIL   Take 10 mg by mouth 3  (three) times daily as needed. For muscles spasms      cyclobenzaprine 10 MG tablet   Commonly known as: FLEXERIL   Take 1 tablet (10 mg total) by mouth 3 (three) times daily as needed for muscle spasms.      etodolac 400 MG tablet   Commonly known as: LODINE   Take 400 mg by mouth 2 (two) times daily as needed. For pain      Fish Oil 1000 MG Caps   Take 1,000 mg by mouth 2 (two) times daily.      ibuprofen 800 MG tablet   Commonly known as: ADVIL,MOTRIN   Take 800 mg by mouth every 8 (eight) hours as needed. For pain      lisinopril-hydrochlorothiazide 10-12.5 MG per tablet   Commonly known as: PRINZIDE,ZESTORETIC   Take 1 tablet by mouth daily.      MAG-OX PO   Take 1 tablet by mouth daily.      Methylsulfonylmethane 1000 MG Caps   Take 1,000 mg by mouth 2 (two) times daily.      multivitamin with minerals Tabs   Take 1 tablet by mouth daily.      oxyCODONE-acetaminophen 5-325 MG per tablet   Commonly known as: PERCOCET/ROXICET   Take 1-2 tablets by mouth every 4 (four) hours as needed. For pain      oxyCODONE-acetaminophen 5-325 MG per tablet   Commonly known as: PERCOCET/ROXICET   Take 1-2 tablets by mouth every 4 (four) hours as needed.  Vitamin D 2000 UNITS Caps   Take 1 capsule by mouth 2 (two) times daily.         Signed: Piccola Arico P 01/19/2012, 8:38 AM

## 2012-01-24 ENCOUNTER — Encounter (HOSPITAL_COMMUNITY): Payer: Self-pay | Admitting: Family Medicine

## 2012-01-24 ENCOUNTER — Telehealth: Payer: Self-pay | Admitting: Infectious Disease

## 2012-01-24 ENCOUNTER — Emergency Department (HOSPITAL_COMMUNITY): Payer: Medicare Other

## 2012-01-24 ENCOUNTER — Inpatient Hospital Stay (HOSPITAL_COMMUNITY)
Admission: EM | Admit: 2012-01-24 | Discharge: 2012-01-28 | DRG: 541 | Disposition: A | Discharge status: Home Health | Payer: Medicare Other | Attending: Neurosurgery | Admitting: Neurosurgery

## 2012-01-24 DIAGNOSIS — M549 Dorsalgia, unspecified: Secondary | ICD-10-CM

## 2012-01-24 DIAGNOSIS — I10 Essential (primary) hypertension: Secondary | ICD-10-CM | POA: Diagnosis present

## 2012-01-24 DIAGNOSIS — M519 Unspecified thoracic, thoracolumbar and lumbosacral intervertebral disc disorder: Secondary | ICD-10-CM | POA: Diagnosis present

## 2012-01-24 DIAGNOSIS — M869 Osteomyelitis, unspecified: Principal | ICD-10-CM | POA: Diagnosis present

## 2012-01-24 DIAGNOSIS — M4640 Discitis, unspecified, site unspecified: Secondary | ICD-10-CM

## 2012-01-24 LAB — BASIC METABOLIC PANEL
BUN: 21 mg/dL (ref 6–23)
CO2: 25 mEq/L (ref 19–32)
Calcium: 9 mg/dL (ref 8.4–10.5)
Chloride: 91 mEq/L — ABNORMAL LOW (ref 96–112)
Creatinine, Ser: 1.4 mg/dL — ABNORMAL HIGH (ref 0.50–1.35)
GFR calc Af Amer: 61 mL/min — ABNORMAL LOW (ref >90)
GFR calc non Af Amer: 53 mL/min — ABNORMAL LOW (ref >90)
Glucose, Bld: 114 mg/dL — ABNORMAL HIGH (ref 70–99)
Potassium: 3 mEq/L — ABNORMAL LOW (ref 3.5–5.1)
Sodium: 128 mEq/L — ABNORMAL LOW (ref 135–145)

## 2012-01-24 LAB — CBC WITH DIFFERENTIAL/PLATELET
Basophils Absolute: 0 10*3/uL (ref 0.0–0.1)
Basophils Relative: 0 % (ref 0–1)
Eosinophils Absolute: 0.2 10*3/uL (ref 0.0–0.7)
Eosinophils Relative: 5 % (ref 0–5)
HCT: 32.2 % — ABNORMAL LOW (ref 39.0–52.0)
Hemoglobin: 11.7 g/dL — ABNORMAL LOW (ref 13.0–17.0)
Lymphocytes Relative: 14 % (ref 12–46)
Lymphs Abs: 0.7 10*3/uL (ref 0.7–4.0)
MCH: 30 pg (ref 26.0–34.0)
MCHC: 36.3 g/dL — ABNORMAL HIGH (ref 30.0–36.0)
MCV: 82.6 fL (ref 78.0–100.0)
Monocytes Absolute: 0.7 10*3/uL (ref 0.1–1.0)
Monocytes Relative: 14 % — ABNORMAL HIGH (ref 3–12)
Neutro Abs: 3 10*3/uL (ref 1.7–7.7)
Neutrophils Relative %: 66 % (ref 43–77)
Platelets: 128 10*3/uL — ABNORMAL LOW (ref 150–400)
RBC: 3.9 MIL/uL — ABNORMAL LOW (ref 4.22–5.81)
RDW: 13.1 % (ref 11.5–15.5)
WBC: 4.6 10*3/uL (ref 4.0–10.5)

## 2012-01-24 LAB — CREATININE, SERUM
Creatinine, Ser: 1.46 mg/dL — ABNORMAL HIGH (ref 0.50–1.35)
GFR calc Af Amer: 58 mL/min — ABNORMAL LOW (ref >90)
GFR calc non Af Amer: 50 mL/min — ABNORMAL LOW (ref >90)

## 2012-01-24 LAB — SEDIMENTATION RATE: Sed Rate: 56 mm/hr — ABNORMAL HIGH (ref 0–16)

## 2012-01-24 LAB — CBC
HCT: 33.1 % — ABNORMAL LOW (ref 39.0–52.0)
Hemoglobin: 12 g/dL — ABNORMAL LOW (ref 13.0–17.0)
MCH: 29.9 pg (ref 26.0–34.0)
MCHC: 36.3 g/dL — ABNORMAL HIGH (ref 30.0–36.0)
MCV: 82.3 fL (ref 78.0–100.0)
Platelets: 122 10*3/uL — ABNORMAL LOW (ref 150–400)
RBC: 4.02 MIL/uL — ABNORMAL LOW (ref 4.22–5.81)
RDW: 13.1 % (ref 11.5–15.5)
WBC: 4.1 10*3/uL (ref 4.0–10.5)

## 2012-01-24 LAB — PROTIME-INR
INR: 1.22 (ref 0.00–1.49)
Prothrombin Time: 15.2 seconds (ref 11.6–15.2)

## 2012-01-24 LAB — C-REACTIVE PROTEIN: CRP: 6.2 mg/dL — ABNORMAL HIGH (ref <0.60)

## 2012-01-24 LAB — APTT: aPTT: 43 seconds — ABNORMAL HIGH (ref 24–37)

## 2012-01-24 MED ORDER — METHYLSULFONYLMETHANE 1000 MG PO CAPS: 1000.0000 mg | ORAL_CAPSULE | Freq: Two times a day (BID) | ORAL | Status: DC

## 2012-01-24 MED ORDER — ONDANSETRON HCL 4 MG/2ML IJ SOLN
4.0000 mg | Freq: Four times a day (QID) | INTRAMUSCULAR | Status: DC | PRN
  Administered 2012-01-24: 4 mg via INTRAVENOUS
  Filled 2012-01-24: qty 2

## 2012-01-24 MED ORDER — HYDROMORPHONE HCL 2 MG PO TABS
2.0000 mg | ORAL_TABLET | ORAL | Status: DC | PRN
  Administered 2012-01-24 – 2012-01-28 (×7): 2 mg via ORAL
  Filled 2012-01-24 (×8): qty 1

## 2012-01-24 MED ORDER — ACETAMINOPHEN 325 MG PO TABS: 650.0000 mg | ORAL_TABLET | Freq: Four times a day (QID) | ORAL | Status: DC | PRN

## 2012-01-24 MED ORDER — LISINOPRIL-HYDROCHLOROTHIAZIDE 10-12.5 MG PO TABS: 1.0000 | ORAL_TABLET | Freq: Every day | ORAL | Status: DC

## 2012-01-24 MED ORDER — ENOXAPARIN SODIUM 30 MG/0.3ML ~~LOC~~ SOLN
30.0000 mg | Freq: Every day | SUBCUTANEOUS | Status: DC
  Administered 2012-01-25: 30 mg via SUBCUTANEOUS
  Filled 2012-01-24 (×2): qty 0.3

## 2012-01-24 MED ORDER — ONDANSETRON HCL 4 MG PO TABS
4.0000 mg | ORAL_TABLET | Freq: Four times a day (QID) | ORAL | Status: DC | PRN
  Administered 2012-01-24: 4 mg via ORAL
  Filled 2012-01-24 (×2): qty 1

## 2012-01-24 MED ORDER — HYDROCHLOROTHIAZIDE 12.5 MG PO CAPS
12.5000 mg | ORAL_CAPSULE | Freq: Every day | ORAL | Status: DC
  Administered 2012-01-25 – 2012-01-28 (×4): 12.5 mg via ORAL
  Filled 2012-01-24 (×4): qty 1

## 2012-01-24 MED ORDER — DEXTROSE 5 % IV SOLN
2.0000 g | INTRAVENOUS | Status: DC
  Filled 2012-01-24: qty 2

## 2012-01-24 MED ORDER — HYDROMORPHONE HCL PF 1 MG/ML IJ SOLN
1.0000 mg | Freq: Once | INTRAMUSCULAR | Status: AC
  Administered 2012-01-24: 1 mg via INTRAVENOUS
  Filled 2012-01-24: qty 1

## 2012-01-24 MED ORDER — VANCOMYCIN HCL IN DEXTROSE 1-5 GM/200ML-% IV SOLN
1000.0000 mg | Freq: Three times a day (TID) | INTRAVENOUS | Status: DC
  Administered 2012-01-25 (×3): 1000 mg via INTRAVENOUS
  Filled 2012-01-24 (×4): qty 200

## 2012-01-24 MED ORDER — ALUM & MAG HYDROXIDE-SIMETH 200-200-20 MG/5ML PO SUSP
30.0000 mL | Freq: Four times a day (QID) | ORAL | Status: DC | PRN
  Administered 2012-01-25 – 2012-01-27 (×3): 30 mL via ORAL
  Filled 2012-01-24 (×3): qty 30

## 2012-01-24 MED ORDER — IBUPROFEN 800 MG PO TABS
800.0000 mg | ORAL_TABLET | Freq: Three times a day (TID) | ORAL | Status: DC | PRN
  Administered 2012-01-25 – 2012-01-26 (×4): 800 mg via ORAL
  Filled 2012-01-24 (×4): qty 1

## 2012-01-24 MED ORDER — ACETAMINOPHEN 650 MG RE SUPP: 650.0000 mg | Freq: Four times a day (QID) | RECTAL | Status: DC | PRN

## 2012-01-24 MED ORDER — ONDANSETRON HCL 4 MG/2ML IJ SOLN
4.0000 mg | Freq: Once | INTRAMUSCULAR | Status: AC
  Administered 2012-01-24: 4 mg via INTRAVENOUS
  Filled 2012-01-24: qty 2

## 2012-01-24 MED ORDER — GADOBENATE DIMEGLUMINE 529 MG/ML IV SOLN
20.0000 mL | Freq: Once | INTRAVENOUS | Status: AC | PRN
  Administered 2012-01-24: 20 mL via INTRAVENOUS

## 2012-01-24 MED ORDER — HYDROMORPHONE HCL PF 1 MG/ML IJ SOLN
1.0000 mg | INTRAMUSCULAR | Status: DC | PRN
Start: 2012-01-24 — End: 2012-01-28
  Administered 2012-01-24 – 2012-01-27 (×2): 1 mg via INTRAVENOUS
  Filled 2012-01-24 (×2): qty 1

## 2012-01-24 MED ORDER — ETODOLAC 400 MG PO TABS
400.0000 mg | ORAL_TABLET | Freq: Every day | ORAL | Status: DC
  Administered 2012-01-25 – 2012-01-28 (×4): 400 mg via ORAL
  Filled 2012-01-24 (×4): qty 1

## 2012-01-24 MED ORDER — DIPHENHYDRAMINE HCL 25 MG PO CAPS
25.0000 mg | ORAL_CAPSULE | Freq: Four times a day (QID) | ORAL | Status: DC | PRN
  Administered 2012-01-24 – 2012-01-26 (×4): 25 mg via ORAL
  Filled 2012-01-24 (×6): qty 1

## 2012-01-24 MED ORDER — CYCLOBENZAPRINE HCL 10 MG PO TABS
10.0000 mg | ORAL_TABLET | Freq: Three times a day (TID) | ORAL | Status: DC | PRN
  Administered 2012-01-26 – 2012-01-27 (×2): 10 mg via ORAL
  Filled 2012-01-24 (×3): qty 1

## 2012-01-24 MED ORDER — DOCUSATE SODIUM 100 MG PO CAPS
100.0000 mg | ORAL_CAPSULE | Freq: Two times a day (BID) | ORAL | Status: DC
  Administered 2012-01-24 – 2012-01-28 (×8): 100 mg via ORAL
  Filled 2012-01-24 (×8): qty 1

## 2012-01-24 MED ORDER — ASPIRIN EC 81 MG PO TBEC
81.0000 mg | DELAYED_RELEASE_TABLET | Freq: Every day | ORAL | Status: DC
  Administered 2012-01-25 – 2012-01-28 (×4): 81 mg via ORAL
  Filled 2012-01-24 (×5): qty 1

## 2012-01-24 MED ORDER — LISINOPRIL 10 MG PO TABS
10.0000 mg | ORAL_TABLET | Freq: Every day | ORAL | Status: DC
  Administered 2012-01-25 – 2012-01-28 (×4): 10 mg via ORAL
  Filled 2012-01-24 (×4): qty 1

## 2012-01-24 MED ORDER — SODIUM CHLORIDE 0.9 % IV SOLN: Freq: Once | INTRAVENOUS | Status: DC

## 2012-01-24 NOTE — ED Notes (Signed)
Iv team paged to assess picc line

## 2012-01-24 NOTE — H&P (Signed)
Charles Navarro is an 62 y.o. male.   Chief Complaint: Back pain discitis HPI: Patient is a 5 year old son who underwent laminectomy discectomy back in November was readmitted 2 weeks ago with an L4-5 discitis he was placed on IV antibiotics factor Rocephin is beginning home IV antibiotic treatments however over the weekend he started getting worsening back pain and today we will copious and shaking chills temperature 102.2 and severe back pain and difficulty ambulating because of back pain. He denies any lower [name] pain denies any new numbness in his legs or his feet denies any difficulty with bowel bladder he has been using Percocet and put in for pain with limited success.  Past Medical History  Diagnosis Date  . Hypertension     takes meds daily  . Depression   . Vertigo   . Bronchitis     hx of    Past Surgical History  Procedure Date  . Colonoscopy   . Wisdom tooth extraction   . Lumbar laminectomy/decompression microdiscectomy 11/08/2011    Procedure: LUMBAR LAMINECTOMY/DECOMPRESSION MICRODISCECTOMY 1 LEVEL;  Surgeon: Mariam Dollar, MD;  Location: [facility];  Service: Neurosurgery;  Laterality: Left;  left lumbar four-five decompression laminectomy microdiscectomy    Family History  Problem Relation Age of Onset  . Alcoholism Father    Social History:  reports that he has never smoked. He does not have any smokeless tobacco history on file. He reports that he does not drink alcohol or use illicit drugs.  Allergies:  Allergies  Allergen Reactions  . Codeine Nausea And Vomiting    dizziness  . Tramadol Nausea And Vomiting     (Not in a hospital admission)  No results found for this or any previous visit (from the past 48 hour(s)). No results found.  Review of Systems  Constitutional: Negative.   HENT: Negative.   Eyes: Negative.   Respiratory: Negative.   Cardiovascular: Negative.   Gastrointestinal: Positive for nausea, vomiting, abdominal pain and constipation.    Musculoskeletal: Positive for back pain.  Skin: Negative.   Neurological: Negative.   Endo/Heme/Allergies: Negative.   Psychiatric/Behavioral: Negative.     Blood pressure 109/66, pulse 60, temperature 97.6 F (36.4 C), temperature source Oral, resp. rate 18, SpO2 100.00%. Physical Exam  Constitutional: He is oriented to person, place, and time. He appears well-developed and well-nourished.  HENT:  Head: Normocephalic.  Eyes: Pupils are equal, round, and reactive to light.  Neck: Normal range of motion.  Cardiovascular: Normal rate.   Respiratory: Effort normal and breath sounds normal.  GI: Soft.  Musculoskeletal: Normal range of motion.  Neurological: He is alert and oriented to person, place, and time. He has normal strength. GCS eye subscore is 4. GCS verbal subscore is 5. GCS motor subscore is 6.  Reflex Scores:      Patellar reflexes are 1+ on the right side and 1+ on the left side.      Achilles reflexes are 1+ on the right side and 1+ on the left side.      Strength is 5 out of 5 in his iliopsoas, quads, hamstrings, gastrocs, anterior tibialis, and EHL.     Assessment/Plan 62 year old gentleman will be readmitted for pain control and we'll continue to work up to map and monitor his discitis we'll repeat an MRI scan of his back will order CBC sedimentation rate C-reactive protein changes pain medication around and get physical outpatient therapy work with him.  Charles Navarro P 01/24/2012, 5:28 PM

## 2012-01-24 NOTE — ED Notes (Signed)
Iv team called to draw labs

## 2012-01-24 NOTE — Telephone Encounter (Signed)
Patient with fevers, on rocephin and vanco severe LBP, Called by Dr. Dola Argyle office. NO ID appts availalble today. Advised he go to ED

## 2012-01-24 NOTE — ED Notes (Signed)
Per pt has been treated recently for back infection. sts was recently admitted in the hospital and given multiple rounds of abx. sts was sent home and is just not getting any better. Pain is unbearable and unable to get out of bed,

## 2012-01-24 NOTE — ED Provider Notes (Addendum)
History     CSN: [id number]  Arrival date & time 01/24/12  1348   First MD Initiated Contact with Patient 01/24/12 1459      Chief Complaint  Patient presents with  . Back Pain    (Consider location/radiation/quality/duration/timing/severity/associated sxs/prior treatment) HPI Comments: Patient is status post lumbar back surgery by Dr. Wynetta Emery in November of last year. Patient had 3 weeks of doing which better but then the pain in regression of symptoms began in mid December. He had an MRI in early January that showed that he had osteomyelitis. The patient was admitted to the hospital and had been receiving IV antibiotics aggressively. He is released from the hospital 5 days ago and was taking ibuprofen and percocet once or twice a day. He reports last night around 3 AM he had very pronounced chills and rigors. He did not take his temperature at the time and it lasted approximately 3 hours. By 7 this morning he had taken one ibuprofen 800 mg as well as 2 of the Percocets. An hour later, the patient did feel much improved. At the time he couldn't get out of bed or move his back or lift his legs up due to the severe pain. After pain medication was taken, the patient is now able to lift his legs and move around albeit gingerly due to pain he reports no other symptoms to explain his fever and chills such as cough, rhinorrhea, diarrhea or vomiting. patient denies headache, neck stiffness or rash. Patient denies any recent trauma. His home health nurse spoke to his physician's office and was directed to come to the emergency department. She had arrived to administer his antibiotics earlier this morning. He has received his morning vancomycin but not his second antibiotic which is scheduled for this evening at 7:30. He denies any unusual or different numbness in his lower extremities and denies any difficulty urinating.  Patient is a 62 y.o. male presenting with back pain. The history is provided by the  patient, medical records and a relative.  Back Pain  Associated symptoms include a fever. Pertinent negatives include no numbness, no headaches and no weakness.    Past Medical History  Diagnosis Date  . Hypertension     takes meds daily  . Depression   . Vertigo   . Bronchitis     hx of    Past Surgical History  Procedure Date  . Colonoscopy   . Wisdom tooth extraction   . Lumbar laminectomy/decompression microdiscectomy 11/08/2011    Procedure: LUMBAR LAMINECTOMY/DECOMPRESSION MICRODISCECTOMY 1 LEVEL;  Surgeon: Mariam Dollar, MD;  Location: [facility];  Service: Neurosurgery;  Laterality: Left;  left lumbar four-five decompression laminectomy microdiscectomy    Family History  Problem Relation Age of Onset  . Alcoholism Father     History  Substance Use Topics  . Smoking status: Never Smoker   . Smokeless tobacco: Not on file  . Alcohol Use: No      Review of Systems  Constitutional: Positive for fever and chills.  HENT: Negative for neck pain and neck stiffness.   Musculoskeletal: Positive for back pain.  Skin: Negative for rash.  Neurological: Negative for weakness, numbness and headaches.  All other systems reviewed and are negative.    Allergies  Codeine and Tramadol  Home Medications   Current Outpatient Rx  Name  Route  Sig  Dispense  Refill  . ASPIRIN EC 81 MG PO TBEC   Oral   Take 81 mg by  mouth daily.         Marland Kitchen VITAMIN D 2000 UNITS PO CAPS   Oral   Take 1 capsule by mouth 2 (two) times daily.         . CYCLOBENZAPRINE HCL 10 MG PO TABS   Oral   Take 10 mg by mouth 3 (three) times daily as needed. For muscles spasms         . CEFTRIAXONE 2 G/50 ML IVPB MIXTURE   Intravenous   Inject 2 g into the vein daily.   1 L   5     Per ID and [facility] rex[facility]   . ETODOLAC 400 MG PO TABS   Oral   Take 400 mg by mouth 2 (two) times daily as needed. For pain         . IBUPROFEN 800 MG PO TABS   Oral   Take 800 mg by mouth every 8  (eight) hours as needed. For pain         . LISINOPRIL-HYDROCHLOROTHIAZIDE 10-12.5 MG PO TABS   Oral   Take 1 tablet by mouth daily.         Marland Kitchen MAG-OX PO   Oral   Take 1 tablet by mouth daily.         . METHYLSULFONYLMETHANE 1000 MG PO CAPS   Oral   Take 1,000 mg by mouth 2 (two) times daily.         . ADULT MULTIVITAMIN W/MINERALS CH   Oral   Take 1 tablet by mouth daily.         Marland Kitchen FISH OIL 1000 MG PO CAPS   Oral   Take 1,000 mg by mouth 2 (two) times daily.         . OXYCODONE-ACETAMINOPHEN 5-325 MG PO TABS   Oral   Take 1-2 tablets by mouth every 4 (four) hours as needed. For pain         . VANCOMYCIN HCL IN DEXTROSE 1 GM/200ML IV SOLN   Intravenous   Inject 200 mLs (1,000 mg total) into the vein every 8 (eight) hours.   4000 mL   5     BP 109/66  Pulse 60  Temp 97.6 F (36.4 C) (Oral)  Resp 18  SpO2 100%  Physical Exam  Nursing note and vitals reviewed. Constitutional: He appears well-developed and well-nourished.  HENT:  Head: Normocephalic and atraumatic.  Eyes: Pupils are equal, round, and reactive to light. No scleral icterus.  Neck: Normal range of motion. Neck supple.  Cardiovascular: Normal rate and regular rhythm.   No murmur heard. Pulmonary/Chest: Effort normal. No respiratory distress. He has no wheezes.  Musculoskeletal:       Lumbar back: He exhibits decreased range of motion, tenderness and pain. He exhibits no deformity, no laceration, no spasm and normal pulse.  Neurological: He is alert. Coordination normal.  Skin: Skin is warm and dry. No rash noted.  Psychiatric: He has a normal mood and affect.    ED Course  Procedures (including critical care time)   Labs Reviewed  CBC WITH DIFFERENTIAL  BASIC METABOLIC PANEL  APTT  PROTIME-INR  SEDIMENTATION RATE  C-REACTIVE PROTEIN  CULTURE, BLOOD (ROUTINE X 2)  CULTURE, BLOOD (ROUTINE X 2)   No results found.   1. Diskitis   2. Back pain     Room air saturation is  97% this to be normal.  5:36 PM I spoke to Dr. Daiva Eves and Dr. Wynetta Emery who agree  with obtaining MRI with contrast of L Spine.  Pt's labs are still pending.  Dr. Wynetta Emery has now seen pt and planning on re-admission.  Pt reports pain is improved after IV dilaudid  MDM   Plan is to discuss with Dr. Wynetta Emery and to consider repeat MRI. Routine blood tests including CBC are ordered. Patient is given Zofran and IV Dilaudid for pain control.          Gavin Pound. Oletta Lamas, MD 01/24/12 1737  Gavin Pound. Rochele Lueck, MD 01/24/12 4540

## 2012-01-24 NOTE — ED Notes (Signed)
Patient transported to MRI 

## 2012-01-24 NOTE — ED Notes (Signed)
Remains in mri 

## 2012-01-25 DIAGNOSIS — M519 Unspecified thoracic, thoracolumbar and lumbosacral intervertebral disc disorder: Secondary | ICD-10-CM

## 2012-01-25 LAB — BASIC METABOLIC PANEL
BUN: 19 mg/dL (ref 6–23)
CO2: 28 mEq/L (ref 19–32)
Calcium: 8.7 mg/dL (ref 8.4–10.5)
Chloride: 95 mEq/L — ABNORMAL LOW (ref 96–112)
Creatinine, Ser: 1.33 mg/dL (ref 0.50–1.35)
GFR calc Af Amer: 65 mL/min — ABNORMAL LOW (ref >90)
GFR calc non Af Amer: 56 mL/min — ABNORMAL LOW (ref >90)
Glucose, Bld: 111 mg/dL — ABNORMAL HIGH (ref 70–99)
Potassium: 3.1 mEq/L — ABNORMAL LOW (ref 3.5–5.1)
Sodium: 133 mEq/L — ABNORMAL LOW (ref 135–145)

## 2012-01-25 LAB — VANCOMYCIN, TROUGH: Vancomycin Tr: 21.1 ug/mL — ABNORMAL HIGH (ref 10.0–20.0)

## 2012-01-25 MED ORDER — LEVOFLOXACIN 500 MG PO TABS
500.0000 mg | ORAL_TABLET | Freq: Every day | ORAL | Status: DC
  Administered 2012-01-25 – 2012-01-28 (×4): 500 mg via ORAL
  Filled 2012-01-25 (×5): qty 1

## 2012-01-25 MED ORDER — NYSTATIN 100000 UNIT/ML MT SUSP
5.0000 mL | Freq: Four times a day (QID) | OROMUCOSAL | Status: DC
  Administered 2012-01-25 – 2012-01-28 (×12): 500000 [IU] via ORAL
  Filled 2012-01-25 (×18): qty 5

## 2012-01-25 MED ORDER — VANCOMYCIN HCL 1000 MG IV SOLR
750.0000 mg | Freq: Three times a day (TID) | INTRAVENOUS | Status: DC
  Administered 2012-01-26 – 2012-01-28 (×8): 750 mg via INTRAVENOUS
  Filled 2012-01-25 (×9): qty 750

## 2012-01-25 MED ORDER — ADULT MULTIVITAMIN W/MINERALS CH
1.0000 | ORAL_TABLET | Freq: Every day | ORAL | Status: DC
  Administered 2012-01-25 – 2012-01-28 (×4): 1 via ORAL
  Filled 2012-01-25 (×6): qty 1

## 2012-01-25 NOTE — Progress Notes (Signed)
Subjective: Patient reports Little better with his pain he still localizes back he denies any pain is legs he has a little bit of numbness in his toes but that is baseline  Objective: Vital signs in last 24 hours: Temp:  [97.1 F (36.2 C)-98.3 F (36.8 C)] 98.3 F (36.8 C) (01/21 0629) Pulse Rate:  [57-81] 71  (01/21 0629) Resp:  [18] 18  (01/21 0629) BP: (98-138)/(66-75) 138/68 mmHg (01/21 0629) SpO2:  [97 %-100 %] 99 % (01/21 0629) Weight:  [117.618 kg (259 lb 4.8 oz)] 117.618 kg (259 lb 4.8 oz) (01/20 2052)  Intake/Output from previous day:   Intake/Output this shift:    Awake alert oriented strength 5 out of 5  Lab Results:  Basename 01/24/12 1855 01/24/12 1700  WBC 4.1 4.6  HGB 12.0* 11.7*  HCT 33.1* 32.2*  PLT 122* 128*   BMET  Basename 01/24/12 1855 01/24/12 1700  NA -- 128*  K -- 3.0*  CL -- 91*  CO2 -- 25  GLUCOSE -- 114*  BUN -- 21  CREATININE 1.46* 1.40*  CALCIUM -- 9.0    Studies/Results: Mr Lumbar Spine W Wo Contrast  01/24/2012  *RADIOLOGY REPORT*  Clinical Data: Diskitis being treated with antibiotics.  Pain.  MRI LUMBAR SPINE WITHOUT AND WITH CONTRAST  Technique:  Multiplanar and multiecho pulse sequences of the lumbar spine were obtained without and with intravenous contrast.  Contrast: 20mL MULTIHANCE GADOBENATE DIMEGLUMINE 529 MG/ML IV SOLN  Comparison: Several prior exams most recent 01/11/2012.  Findings: Last fully open disc space is labeled L5-S1.  Present examination incorporates from T11-12 disc space through the S3 level.  Conus T12-L1 disc space.  Minimal enhancement of the distal cord / conus at the T12 level (series 8 image 6) may represent artifact, vein or minimal enhancement.  Renal cystic structures incompletely assessed on the present exam.  Prior left L4-5 hemilaminectomy.  L4-5 diskitis and osteomyelitis. Involvement of the L4 vertebral body has progressed since prior exam.  The projection of the infected disc contents into the spinal  canal has decreased slightly in overall dimension.  There remains enhancement/spread of infection along the psoas muscles without a well-defined paraspinal abscess.  Remainder of findings are relatively similar to the prior exam.  IMPRESSION: Prior left L4-5 hemilaminectomy.  L4-5 diskitis and osteomyelitis. Involvement of the L4 vertebral body has progressed since prior exam. Posterior projection of infected disc contents into the spinal canal has decreased slightly in overall dimension. This continues to contribute to spinal stenosis and lateral recess stenosis.  There remains enhancement/spread of infection along the psoas muscles without a well-defined drainable paraspinal abscess.  Critical Value/emergent results were called by telephone at the time of interpretation on 01/24/2012 at 6:55 p.m. to Dr. Oletta Lamas, who verbally acknowledged these results.   Original Report Authenticated By: Lacy Duverney, M.D.     Assessment/Plan: Hospital day 1 from readmission for pain control and your diagnosis L4-5 discitis osteomyelitis. Patient maintains on Rocephin and vancomycin I did have breakthrough fevers prior to admission his pain seems in better control he is afebrile here in the hospital his admission blood work showed a sodium of 128 white count stable sedimentation rate slightly elevated but probably not significantly so it went to 56 and 52 and creatinine is 1.40 which is slightly higher than when he left the hospital last time but lower than when he came in. I of consult and infectious disease to reevaluate his antibiotics to confirm that we have him on appropriate dosing.  We also did send blood cultures on admission again.  LOS: 1 day     Makaveli Hoard P 01/25/2012, 10:31 AM

## 2012-01-25 NOTE — Progress Notes (Signed)
ANTIBIOTIC CONSULT NOTE - INITIAL  Pharmacy Consult for Vancomycin Indication: osteomyelitis/discitis  Allergies  Allergen Reactions  . Codeine Nausea And Vomiting    dizziness  . Tramadol Nausea And Vomiting    Patient Measurements: Height: 6\' 6"  (198.1 cm) Weight: 259 lb 4.8 oz (117.618 kg) IBW/kg (Calculated) : 91.4   Vital Signs: Temp: 97.9 F (36.6 C) (01/21 1801) Temp src: Oral (01/21 1801) BP: 108/57 mmHg (01/21 1801) Pulse Rate: 67  (01/21 1801) Intake/Output from previous day:   Intake/Output from this shift:    Labs:  Basename 01/24/12 1855 01/24/12 1700  WBC 4.1 4.6  HGB 12.0* 11.7*  PLT 122* 128*  LABCREA -- --  CREATININE 1.46* 1.40*   Estimated Creatinine Clearance: 76.6 ml/min (by C-G formula based on Cr of 1.46).  Basename 01/25/12 1605  VANCOTROUGH 21.1*  VANCOPEAK --  Drue Dun --  GENTTROUGH --  GENTPEAK --  GENTRANDOM --  TOBRATROUGH --  TOBRAPEAK --  TOBRARND --  AMIKACINPEAK --  AMIKACINTROU --  AMIKACIN --     Microbiology: Recent Results (from the past 720 hour(s))  CULTURE, BLOOD (ROUTINE X 2)     Status: Normal   Collection Time   01/12/12  5:45 PM      Component Value Range Status Comment   Specimen Description BLOOD ARM RIGHT   Final    Special Requests BOTTLES DRAWN AEROBIC AND ANAEROBIC 10CC   Final    Culture  Setup Time 01/13/2012 00:47   Final    Culture NO GROWTH 5 DAYS   Final    Report Status 01/19/2012 FINAL   Final   CULTURE, BLOOD (ROUTINE X 2)     Status: Normal   Collection Time   01/12/12  6:06 PM      Component Value Range Status Comment   Specimen Description BLOOD HAND RIGHT   Final    Special Requests BOTTLES DRAWN AEROBIC ONLY 10CC   Final    Culture  Setup Time 01/13/2012 00:48   Final    Culture NO GROWTH 5 DAYS   Final    Report Status 01/19/2012 FINAL   Final   SURGICAL PCR SCREEN     Status: Normal   Collection Time   01/13/12  5:52 AM      Component Value Range Status Comment   MRSA, PCR  NEGATIVE  NEGATIVE Final    Staphylococcus aureus NEGATIVE  NEGATIVE Final   CULTURE, ROUTINE-ABSCESS     Status: Normal   Collection Time   01/13/12  1:12 PM      Component Value Range Status Comment   Specimen Description ABSCESS   Final    Special Requests LUMBAR DISC L4 5   Final    Gram Stain     Final    Value: RARE WBC PRESENT, PREDOMINANTLY PMN     RARE SQUAMOUS EPITHELIAL CELLS PRESENT     FEW GRAM POSITIVE COCCI IN PAIRS   Culture NO GROWTH 3 DAYS   Final    Report Status 01/18/2012 FINAL   Final   FUNGUS CULTURE W SMEAR     Status: Normal (Preliminary result)   Collection Time   01/13/12  1:12 PM      Component Value Range Status Comment   Specimen Description ABSCESS   Final    Special Requests LUMBAR DIS L4 5   Final    Fungal Smear NO YEAST OR FUNGAL ELEMENTS SEEN   Final    Culture CULTURE IN PROGRESS FOR FOUR  WEEKS   Final    Report Status PENDING   Incomplete     Medical History: Past Medical History  Diagnosis Date  . Hypertension     takes meds daily  . Depression   . Vertigo   . Bronchitis     hx of    Assessment: 62 y/o male who is on day 13 of vancomycin for L4-5 osteomyelitis/discitis. Vancomycin trough is 21.1 however the trough was drawn about 1 hr early. SCr was 1.09 on 01/13/12 and is currently 1.46 so patient is likely to accumulate.  Previous vancomycin dosing: 01/14/12- trough 10.3 on 1250 q12 --> adjusted to 1250 q8h (Scr 1.09) 01/15/12- trough 21.9 on 1250 q8 --> adjusted to 1g q8h (Scr 1.09) 01/17/12- trough 17.8 on 1g q8h -->   Goal of Therapy:  Vancomycin trough level 15-20 mcg/ml  Plan:  -Decrease vancomycin to 750 mg IV q8h -Check steady-state trough  [facility], [place] Dance.D., BCPS Clinical Pharmacist Pager: [phone] 01/25/2012 6:08 PM

## 2012-01-25 NOTE — Progress Notes (Addendum)
INFECTIOUS DISEASE PROGRESS NOTE  ID: Charles Navarro is a 62 y.o. male with  Active Problems:  * No active hospital problems. *   Subjective: 62 year old male with history of hypertension and a L4-5 laminectomy decompression microdiscectomy on 11/08/2011. Postoperatively he did well however he developed worsening back pain. He underwent a followup MRI on January 7 showing "discitis and osteomyelitis at L4-5 with a small paraspinal phlegmon." There was fluid and soft tissue protruding from the disc space compressing the thecal sac sac and both lateral recesses. He was admitted to the hospital on January 9 and underwent L4-5 disc aspiration by interventional radiology. His Gram stain showed few gram-positive cocci in pairs, cultures were (-). He was sent home on vanco/ceftraixone on 1-15. While at home he developed shaking chills and now returns to the hospital. He gives a hx of 1 week of increasing back pain, worsining numbness in his feet. Dysguesia. Insomnia. Denies loose Bm (last BM yesterday). Wound is well healed. No problems with PIC line.   He has a repeat MRI showing no abscess but continued infection of his L4-5.     Abtx:  Anti-infectives     Start     Dose/Rate Route Frequency Ordered Stop   01/24/12 1745   cefTRIAXone (ROCEPHIN) 2 g in dextrose 5 % 50 mL IVPB        2 g 100 mL/hr over 30 Minutes Intravenous Every 24 hours 01/24/12 1740     01/24/12 1745   vancomycin (VANCOCIN) IVPB 1000 mg/200 mL premix        1,000 mg 200 mL/hr over 60 Minutes Intravenous Every 8 hours 01/24/12 1740            Medications:  Scheduled:   . sodium chloride   Intravenous Once  . aspirin EC  81 mg Oral Daily  . cefTRIAXone (ROCEPHIN) IVPB 2 gram/50 mL D5W (Pyxis)  2 g Intravenous Q24H  . docusate sodium  100 mg Oral BID  . enoxaparin (LOVENOX) injection  30 mg Subcutaneous q1800  . etodolac  400 mg Oral Daily  . lisinopril  10 mg Oral Daily   And  . hydrochlorothiazide  12.5 mg Oral  Daily  . multivitamin with minerals  1 tablet Oral Daily  . nystatin  5 mL Oral QID  . vancomycin  1,000 mg Intravenous Q8H    Objective: Vital signs in last 24 hours: Temp:  [97.1 F (36.2 C)-98.3 F (36.8 C)] 97.6 F (36.4 C) (01/21 1058) Pulse Rate:  [57-71] 64  (01/21 1058) Resp:  [16-18] 16  (01/21 1058) BP: (109-138)/(66-75) 116/66 mmHg (01/21 1058) SpO2:  [99 %-100 %] 100 % (01/21 1058) Weight:  [117.618 kg (259 lb 4.8 oz)] 117.618 kg (259 lb 4.8 oz) (01/20 2052)   General appearance: alert, cooperative and no distress Eyes: negative findings: pupils equal, round, reactive to light and accomodation Throat: normal findings: oropharynx pink & moist without lesions or evidence of thrush and mild angular chelitis Resp: clear to auscultation bilaterally Cardio: regular rate and rhythm GI: normal findings: bowel sounds normal and soft, non-tender Extremities: RUE PIC- non-tender, no erythema, no d/c.  Neurologic: Motor: 5/5 plantar and dorsiflexion Incision/Wound: clean, well healed, non-tender, no increase in heat,  No fluctuance.   Lab Results  Basename 01/24/12 1855 01/24/12 1700  WBC 4.1 4.6  HGB 12.0* 11.7*  HCT 33.1* 32.2*  NA -- 128*  K -- 3.0*  CL -- 91*  CO2 -- 25  BUN -- 21  CREATININE 1.46* 1.40*  GLU -- --   Liver Panel No results found for this basename: PROT:2,ALBUMIN:2,AST:2,ALT:2,ALKPHOS:2,BILITOT:2,BILIDIR:2,IBILI:2 in the last 72 hours Sedimentation Rate  Basename 01/24/12 1700  ESRSEDRATE 56*   C-Reactive Protein  Basename 01/24/12 1700  CRP 6.2*    Microbiology: No results found for this or any previous visit (from the past 240 hour(s)).  Studies/Results: Mr Lumbar Spine W Wo Contrast  01/24/2012  *RADIOLOGY REPORT*  Clinical Data: Diskitis being treated with antibiotics.  Pain.  MRI LUMBAR SPINE WITHOUT AND WITH CONTRAST  Technique:  Multiplanar and multiecho pulse sequences of the lumbar spine were obtained without and with  intravenous contrast.  Contrast: 20mL MULTIHANCE GADOBENATE DIMEGLUMINE 529 MG/ML IV SOLN  Comparison: Several prior exams most recent 01/11/2012.  Findings: Last fully open disc space is labeled L5-S1.  Present examination incorporates from T11-12 disc space through the S3 level.  Conus T12-L1 disc space.  Minimal enhancement of the distal cord / conus at the T12 level (series 8 image 6) may represent artifact, vein or minimal enhancement.  Renal cystic structures incompletely assessed on the present exam.  Prior left L4-5 hemilaminectomy.  L4-5 diskitis and osteomyelitis. Involvement of the L4 vertebral body has progressed since prior exam.  The projection of the infected disc contents into the spinal canal has decreased slightly in overall dimension.  There remains enhancement/spread of infection along the psoas muscles without a well-defined paraspinal abscess.  Remainder of findings are relatively similar to the prior exam.  IMPRESSION: Prior left L4-5 hemilaminectomy.  L4-5 diskitis and osteomyelitis. Involvement of the L4 vertebral body has progressed since prior exam. Posterior projection of infected disc contents into the spinal canal has decreased slightly in overall dimension. This continues to contribute to spinal stenosis and lateral recess stenosis.  There remains enhancement/spread of infection along the psoas muscles without a well-defined drainable paraspinal abscess.  Critical Value/emergent results were called by telephone at the time of interpretation on 01/24/2012 at 6:55 p.m. to Dr. Oletta Lamas, who verbally acknowledged these results.   Original Report Authenticated By: Lacy Duverney, M.D.      Assessment/Plan: Osteomyelitis/Discitis Fever  Would- change his ceftriaxone to levaquin (for possible ADR) Watch his temps in hospital  Watch his BCx (minimal suspicion of infected PIC).  Check vanco tr  Total days of antibiotics: 13         [name] Infectious  Diseases [phone] 01/25/2012, 2:37 PM   LOS: 1 day

## 2012-01-26 DIAGNOSIS — M519 Unspecified thoracic, thoracolumbar and lumbosacral intervertebral disc disorder: Secondary | ICD-10-CM

## 2012-01-26 DIAGNOSIS — R509 Fever, unspecified: Secondary | ICD-10-CM

## 2012-01-26 MED ORDER — ENOXAPARIN SODIUM 40 MG/0.4ML ~~LOC~~ SOLN
40.0000 mg | Freq: Every day | SUBCUTANEOUS | Status: DC
  Administered 2012-01-26 – 2012-01-27 (×2): 40 mg via SUBCUTANEOUS
  Filled 2012-01-26 (×3): qty 0.4

## 2012-01-26 NOTE — Progress Notes (Signed)
Subjective: Patient reports Overall he feels about the same he had one episode or shakes last night he was not febrile at the time pain level is about the same it is managed well with the gets medications used being helped by the Dilaudid orally and a muscle relaxer he also takes ibuprofen we'll start experience some reflux.  Objective: Vital signs in last 24 hours: Temp:  [97.5 F (36.4 C)-98.5 F (36.9 C)] 97.5 F (36.4 C) (01/22 0700) Pulse Rate:  [64-69] 67  (01/22 0700) Resp:  [16-18] 18  (01/22 0700) BP: (104-124)/(56-68) 112/66 mmHg (01/22 0700) SpO2:  [99 %-100 %] 99 % (01/22 0700)  Intake/Output from previous day: 01/21 0701 - 01/22 0700 In: 1080 [P.O.:960; I.V.:120] Out: 200 [Urine:200] Intake/Output this shift: Total I/O In: -  Out: 500 [Urine:500]  Awake alert oriented strength out of 5 continue to mobilize with physical therapy in his corset we'll continue to the vancomycin Levaquin I explained the patient that his point of discharge is when the pain is manageable enough on the pills he feels comfortable going home. We'll continue to await word from the cultures we sent as well as any discharge comments from infectious disease for tailoring his antibiotics.  Lab Results:  Basename 01/24/12 1855 01/24/12 1700  WBC 4.1 4.6  HGB 12.0* 11.7*  HCT 33.1* 32.2*  PLT 122* 128*   BMET  Basename 01/25/12 2021 01/24/12 1855 01/24/12 1700  NA 133* -- 128*  K 3.1* -- 3.0*  CL 95* -- 91*  CO2 28 -- 25  GLUCOSE 111* -- 114*  BUN 19 -- 21  CREATININE 1.33 1.46* --  CALCIUM 8.7 -- 9.0    Studies/Results: Mr Lumbar Spine W Wo Contrast  01/24/2012  *RADIOLOGY REPORT*  Clinical Data: Diskitis being treated with antibiotics.  Pain.  MRI LUMBAR SPINE WITHOUT AND WITH CONTRAST  Technique:  Multiplanar and multiecho pulse sequences of the lumbar spine were obtained without and with intravenous contrast.  Contrast: 20mL MULTIHANCE GADOBENATE DIMEGLUMINE 529 MG/ML IV SOLN   Comparison: Several prior exams most recent 01/11/2012.  Findings: Last fully open disc space is labeled L5-S1.  Present examination incorporates from T11-12 disc space through the S3 level.  Conus T12-L1 disc space.  Minimal enhancement of the distal cord / conus at the T12 level (series 8 image 6) may represent artifact, vein or minimal enhancement.  Renal cystic structures incompletely assessed on the present exam.  Prior left L4-5 hemilaminectomy.  L4-5 diskitis and osteomyelitis. Involvement of the L4 vertebral body has progressed since prior exam.  The projection of the infected disc contents into the spinal canal has decreased slightly in overall dimension.  There remains enhancement/spread of infection along the psoas muscles without a well-defined paraspinal abscess.  Remainder of findings are relatively similar to the prior exam.  IMPRESSION: Prior left L4-5 hemilaminectomy.  L4-5 diskitis and osteomyelitis. Involvement of the L4 vertebral body has progressed since prior exam. Posterior projection of infected disc contents into the spinal canal has decreased slightly in overall dimension. This continues to contribute to spinal stenosis and lateral recess stenosis.  There remains enhancement/spread of infection along the psoas muscles without a well-defined drainable paraspinal abscess.  Critical Value/emergent results were called by telephone at the time of interpretation on 01/24/2012 at 6:55 p.m. to Dr. Oletta Lamas, who verbally acknowledged these results.   Original Report Authenticated By: Lacy Duverney, M.D.     Assessment/Plan: Progressive mobilization physical therapy. Continue to work on pain management. We'll discontinue his  ibuprofen for his reflux.  LOS: 2 days     Roberta Kelly P 01/26/2012, 10:32 AM

## 2012-01-26 NOTE — Progress Notes (Signed)
INFECTIOUS DISEASE PROGRESS NOTE  ID: Charles Navarro is a 62 y.o. male with  Active Problems:  * No active hospital problems. *   Subjective: Without complaints, had chills last pm (? With anbx infusion)  Abtx:  Anti-infectives     Start     Dose/Rate Route Frequency Ordered Stop   01/26/12 0200   vancomycin (VANCOCIN) 750 mg in sodium chloride 0.9 % 150 mL IVPB        750 mg 150 mL/hr over 60 Minutes Intravenous Every 8 hours 01/25/12 1825     01/25/12 1515   levofloxacin (LEVAQUIN) tablet 500 mg        500 mg Oral Daily 01/25/12 1503     01/24/12 1745   cefTRIAXone (ROCEPHIN) 2 g in dextrose 5 % 50 mL IVPB  Status:  Discontinued        2 g 100 mL/hr over 30 Minutes Intravenous Every 24 hours 01/24/12 1740 01/25/12 1503   01/24/12 1745   vancomycin (VANCOCIN) IVPB 1000 mg/200 mL premix  Status:  Discontinued        1,000 mg 200 mL/hr over 60 Minutes Intravenous Every 8 hours 01/24/12 1740 01/25/12 1825          Medications:  Scheduled:   . sodium chloride   Intravenous Once  . aspirin EC  81 mg Oral Daily  . docusate sodium  100 mg Oral BID  . enoxaparin (LOVENOX) injection  40 mg Subcutaneous q1800  . etodolac  400 mg Oral Daily  . lisinopril  10 mg Oral Daily   And  . hydrochlorothiazide  12.5 mg Oral Daily  . levofloxacin  500 mg Oral Daily  . multivitamin with minerals  1 tablet Oral Daily  . nystatin  5 mL Oral QID  . vancomycin  750 mg Intravenous Q8H    Objective: Vital signs in last 24 hours: Temp:  [97.5 F (36.4 C)-98.5 F (36.9 C)] 97.8 F (36.6 C) (01/22 1433) Pulse Rate:  [67-69] 69  (01/22 1433) Resp:  [16-18] 18  (01/22 1433) BP: (99-124)/(57-68) 99/59 mmHg (01/22 1433) SpO2:  [99 %-100 %] 100 % (01/22 1433)   General appearance: alert, cooperative and no distress Resp: clear to auscultation bilaterally Cardio: regularly irregular rhythm GI: normal findings: bowel sounds normal and soft, non-tender RUE PIC clean, non-tender  Lab  Results  Basename 01/25/12 2021 01/24/12 1855 01/24/12 1700  WBC -- 4.1 4.6  HGB -- 12.0* 11.7*  HCT -- 33.1* 32.2*  NA 133* -- 128*  K 3.1* -- 3.0*  CL 95* -- 91*  CO2 28 -- 25  BUN 19 -- 21  CREATININE 1.33 1.46* --  GLU -- -- --   Liver Panel No results found for this basename: PROT:2,ALBUMIN:2,AST:2,ALT:2,ALKPHOS:2,BILITOT:2,BILIDIR:2,IBILI:2 in the last 72 hours Sedimentation Rate  Basename 01/24/12 1700  ESRSEDRATE 56*   C-Reactive Protein  Basename 01/24/12 1700  CRP 6.2*    Microbiology: Recent Results (from the past 240 hour(s))  CULTURE, BLOOD (ROUTINE X 2)     Status: Normal (Preliminary result)   Collection Time   01/24/12  6:45 PM      Component Value Range Status Comment   Specimen Description BLOOD ARM LEFT   Final    Special Requests BOTTLES DRAWN AEROBIC AND ANAEROBIC 10CC   Final    Culture  Setup Time 01/25/2012 00:39   Final    Culture     Final    Value:        BLOOD  CULTURE RECEIVED NO GROWTH TO DATE CULTURE WILL BE HELD FOR 5 DAYS BEFORE ISSUING A FINAL NEGATIVE REPORT   Report Status PENDING   Incomplete   CULTURE, BLOOD (ROUTINE X 2)     Status: Normal (Preliminary result)   Collection Time   01/24/12  7:00 PM      Component Value Range Status Comment   Specimen Description BLOOD ARM LEFT   Final    Special Requests BOTTLES DRAWN AEROBIC AND ANAEROBIC 10CC   Final    Culture  Setup Time 01/25/2012 00:39   Final    Culture     Final    Value:        BLOOD CULTURE RECEIVED NO GROWTH TO DATE CULTURE WILL BE HELD FOR 5 DAYS BEFORE ISSUING A FINAL NEGATIVE REPORT   Report Status PENDING   Incomplete     Studies/Results: Mr Lumbar Spine W Wo Contrast  01/24/2012  *RADIOLOGY REPORT*  Clinical Data: Diskitis being treated with antibiotics.  Pain.  MRI LUMBAR SPINE WITHOUT AND WITH CONTRAST  Technique:  Multiplanar and multiecho pulse sequences of the lumbar spine were obtained without and with intravenous contrast.  Contrast: 20mL MULTIHANCE  GADOBENATE DIMEGLUMINE 529 MG/ML IV SOLN  Comparison: Several prior exams most recent 01/11/2012.  Findings: Last fully open disc space is labeled L5-S1.  Present examination incorporates from T11-12 disc space through the S3 level.  Conus T12-L1 disc space.  Minimal enhancement of the distal cord / conus at the T12 level (series 8 image 6) may represent artifact, vein or minimal enhancement.  Renal cystic structures incompletely assessed on the present exam.  Prior left L4-5 hemilaminectomy.  L4-5 diskitis and osteomyelitis. Involvement of the L4 vertebral body has progressed since prior exam.  The projection of the infected disc contents into the spinal canal has decreased slightly in overall dimension.  There remains enhancement/spread of infection along the psoas muscles without a well-defined paraspinal abscess.  Remainder of findings are relatively similar to the prior exam.  IMPRESSION: Prior left L4-5 hemilaminectomy.  L4-5 diskitis and osteomyelitis. Involvement of the L4 vertebral body has progressed since prior exam. Posterior projection of infected disc contents into the spinal canal has decreased slightly in overall dimension. This continues to contribute to spinal stenosis and lateral recess stenosis.  There remains enhancement/spread of infection along the psoas muscles without a well-defined drainable paraspinal abscess.  Critical Value/emergent results were called by telephone at the time of interpretation on 01/24/2012 at 6:55 p.m. to Dr. Oletta Navarro, who verbally acknowledged these results.   Original Report Authenticated By: Charles Navarro, M.D.      Assessment/Plan: Osteomyelitis/Discitis  Fever   Would-  Watch his sx off ceftriaxone afebrile in hospital  Watch his BCx (minimal suspicion of infected PIC).  vanco tr 21.1 (aim for 15-20), appreciate pharm help Total days of antibiotics: 13 (vanco/levaqun)    Charles Navarro Infectious Diseases [phone] 01/26/2012, 3:47 PM   LOS: 2 days

## 2012-01-26 NOTE — Progress Notes (Signed)
Orthopedic Tech Progress Note Patient Details:  Sho Salguero Apr 02, 1950 [id number]  Patient ID: [name], male   DOB: [birth year], 62 y.o.   MRN: [id number]   Shawnie Pons 01/26/2012, 8:51 AM CALLED [facility] FOR LUMBAR CORSET.

## 2012-01-26 NOTE — Evaluation (Signed)
Physical Therapy Evaluation Patient Details Name: Fields Oros MRN: [id number] DOB: 1950-03-04 Today's Date: 01/26/2012 Time: 1191-4782 PT Time Calculation (min): 34 min  PT Assessment / Plan / Recommendation Clinical Impression  Pt 62 yo male with diskitis mobilizing well. Pt does require RW for safe ambulation but anticipate pt to be safe for d/c home with 24/7 assist/supervision of spouse.    PT Assessment  Patient needs continued PT services    Follow Up Recommendations  No PT follow up;Supervision/Assistance - 24 hour    Does the patient have the potential to tolerate intense rehabilitation      Barriers to Discharge None      Equipment Recommendations  None recommended by PT    Recommendations for Other Services     Frequency Min 3X/week    Precautions / Restrictions Precautions Precaution Booklet Issued: Yes (comment) Precaution Comments: pt familar from surgery in [place] Required Braces or Orthoses: Spinal Brace Spinal Brace: Lumbar corset Restrictions Weight Bearing Restrictions: No   Pertinent Vitals/Pain 2/10 back pain s/p pain medicine      Mobility  Bed Mobility Bed Mobility: Rolling Right;Right Sidelying to Sit;Sitting - Scoot to Edge of Bed Rolling Right: 6: Modified independent (Device/Increase time);With rail Right Sidelying to Sit: 6: Modified independent (Device/Increase time);HOB flat;HOB elevated Sitting - Scoot to Edge of Bed: 6: Modified independent (Device/Increase time) Details for Bed Mobility Assistance: encouraged pt to lower HOB to mimic home set up, strong use of bed rail Transfers Transfers: Sit to Stand;Stand to Sit Sit to Stand: With upper extremity assist;From bed;From elevated surface;4: Min assist Stand to Sit: With upper extremity assist;To elevated surface;To bed;5: Supervision Details for Transfer Assistance: increased time required, cautious Ambulation/Gait Ambulation/Gait Assistance: 4: Min guard Ambulation Distance  (Feet): 300 Feet Assistive device: Rolling walker Ambulation/Gait Assistance Details: v/c's to maintain upright posture, no LOB, pt reports "I need this walker." Pt with increased UE WBing. Gait Pattern: Step-through pattern;Decreased stride length;Antalgic Gait velocity: wfl Stairs: No    Shoulder Instructions     Exercises     PT Diagnosis: Difficulty walking;Acute pain  PT Problem List: Decreased activity tolerance;Decreased balance;Decreased mobility;Decreased knowledge of use of DME PT Treatment Interventions: Gait training;Stair training;Functional mobility training;Therapeutic activities   PT Goals Acute Rehab PT Goals PT Goal Formulation: With patient Time For Goal Achievement: 02/02/12 Potential to Achieve Goals: Good Pt will Roll Supine to Right Side: with modified independence PT Goal: Rolling Supine to Right Side - Progress: Goal set today Pt will go Supine/Side to Sit: with modified independence;with HOB 0 degrees PT Goal: Supine/Side to Sit - Progress: Goal set today Pt will go Sit to Stand: with modified independence;with upper extremity assist (up to RW) PT Goal: Sit to Stand - Progress: Goal set today Pt will Ambulate: >150 feet;with modified independence;with rolling walker PT Goal: Ambulate - Progress: Goal set today Pt will Go Up / Down Stairs: 3-5 stairs;with supervision;with rail(s) PT Goal: Up/Down Stairs - Progress: Goal set today  Visit Information  Last PT Received On: 01/26/12 Assistance Needed: +1    Subjective Data  Subjective: Pt received supine in bed agreeable to ambulate. Patient Stated Goal: home   Prior Functioning  Home Living Lives With: Spouse Available Help at Discharge: Family;Available 24 hours/day Type of Home: House Home Access: Stairs to enter [employer] of Steps: 3 Entrance Stairs-Rails: Can reach both Home Layout: One level Bathroom Shower/Tub: Engineer, manufacturing systems: Handicapped height Home Adaptive  Equipment: None Prior Function Level of Independence: Independent  Able to Take Stairs?: Yes Driving: Yes Vocation: On disability Communication Communication: No difficulties Dominant Hand: Right    Cognition  Overall Cognitive Status: Appears within functional limits for tasks assessed/performed Arousal/Alertness: Awake/alert Orientation Level: Appears intact for tasks assessed Behavior During Session: Anxious    Extremity/Trunk Assessment Right Upper Extremity Assessment RUE ROM/Strength/Tone: Within functional levels Left Upper Extremity Assessment LUE ROM/Strength/Tone: Within functional levels Right Lower Extremity Assessment RLE ROM/Strength/Tone: WFL for tasks assessed RLE Sensation: WFL - Light Touch Left Lower Extremity Assessment LLE ROM/Strength/Tone: WFL for tasks assessed LLE Sensation: WFL - Light Touch Trunk Assessment Trunk Assessment: Normal   Balance    End of Session PT - End of Session Equipment Utilized During Treatment: Gait belt;Back brace Activity Tolerance: Patient tolerated treatment well Patient left: in chair;with call bell/phone within reach Nurse Communication: Mobility status  GP     Marcene Brawn 01/26/2012, 1:08 PM  Lewis Shock, PT, DPT Pager #: [phone] Office #: [phone]

## 2012-01-26 NOTE — Progress Notes (Signed)
Orthopedic Tech Progress Note Patient Details:  Charles Navarro 03-06-1950 [id number]  Patient ID: Charles Navarro, male   DOB: [birth year], 62 y.o.   MRN: [id number]   Charles Navarro 01/26/2012, 11:04 AM L-S SUPPORT WITH ANT/AND POST PANEL COMPLETED BY [facility].

## 2012-01-27 NOTE — Progress Notes (Signed)
Physical Therapy Treatment Patient Details Name: Woodrow Drab MRN: [id number] DOB: 31-Jul-1950 Today's Date: 01/27/2012 Time: 4098-1191 PT Time Calculation (min): 23 min  PT Assessment / Plan / Recommendation Comments on Treatment Session  Pt moving well today.  Ambulated entire 4N unit & negotiated stairs this session.      Follow Up Recommendations  No PT follow up;Supervision/Assistance - 24 hour     Does the patient have the potential to tolerate intense rehabilitation     Barriers to Discharge        Equipment Recommendations  None recommended by PT    Recommendations for Other Services    Frequency Min 3X/week   Plan Discharge plan remains appropriate    Precautions / Restrictions Precautions Precaution Comments: pt familar from surgery in [place] Required Braces or Orthoses: Spinal Brace Spinal Brace: Lumbar corset Restrictions Weight Bearing Restrictions: No       Mobility  Bed Mobility Bed Mobility: Not assessed Transfers Transfers: Sit to Stand;Stand to Sit Sit to Stand: 5: Supervision;With upper extremity assist;From bed Stand to Sit: 5: Supervision;With upper extremity assist;To chair/3-in-1;With armrests Details for Transfer Assistance: Pt placing RW to side before turning around to sit in recliner.-- cues for safe RW management.   Ambulation/Gait Ambulation/Gait Assistance: 5: Supervision Ambulation Distance (Feet): 500 Feet Assistive device: Rolling walker Ambulation/Gait Assistance Details: cues for tall posture, decrease reliance of UE's on RW.   Gait Pattern: Step-through pattern;Wide base of support Gait velocity: wfl Stairs: Yes Stairs Assistance: 5: Supervision Stairs Assistance Details (indicate cue type and reason): Supervision for safety & cues for technique.   Stair Management Technique: Two rails;Alternating pattern;Forwards Number of Stairs: 6  Wheelchair Mobility Wheelchair Mobility: No     PT Goals Acute Rehab PT Goals Time For  Goal Achievement: 02/02/12 Potential to Achieve Goals: Good Pt will Roll Supine to Right Side: with modified independence Pt will go Supine/Side to Sit: with modified independence;with HOB 0 degrees Pt will go Sit to Stand: with modified independence;with upper extremity assist PT Goal: Sit to Stand - Progress: Progressing toward goal Pt will Ambulate: >150 feet;with modified independence;with rolling walker PT Goal: Ambulate - Progress: Progressing toward goal Pt will Go Up / Down Stairs: 3-5 stairs;with supervision;with rail(s) PT Goal: Up/Down Stairs - Progress: Met  Visit Information  Last PT Received On: 01/27/12 Assistance Needed: +1    Subjective Data  Subjective: "It feels so good to walk" Patient Stated Goal: home   Cognition  Overall Cognitive Status: Appears within functional limits for tasks assessed/performed Arousal/Alertness: Awake/alert Orientation Level: Appears intact for tasks assessed Behavior During Session: [facility] for tasks performed    Balance     End of Session PT - End of Session Equipment Utilized During Treatment: Gait belt;Back brace Activity Tolerance: Patient tolerated treatment well Patient left: in chair;with call bell/phone within reach     [place], [place] [phone] 01/27/2012

## 2012-01-27 NOTE — Progress Notes (Addendum)
INFECTIOUS DISEASE PROGRESS NOTE  ID: Charles Navarro is a 62 y.o. male with Active Problems:  * No active hospital problems. *   Subjective: Has ambulated, decreased pain. No further fevers.   Abtx:  Anti-infectives     Start     Dose/Rate Route Frequency Ordered Stop   01/26/12 0200   vancomycin (VANCOCIN) 750 mg in sodium chloride 0.9 % 150 mL IVPB        750 mg 150 mL/hr over 60 Minutes Intravenous Every 8 hours 01/25/12 1825     01/25/12 1515   levofloxacin (LEVAQUIN) tablet 500 mg        500 mg Oral Daily 01/25/12 1503     01/24/12 1745   cefTRIAXone (ROCEPHIN) 2 g in dextrose 5 % 50 mL IVPB  Status:  Discontinued        2 g 100 mL/hr over 30 Minutes Intravenous Every 24 hours 01/24/12 1740 01/25/12 1503   01/24/12 1745   vancomycin (VANCOCIN) IVPB 1000 mg/200 mL premix  Status:  Discontinued        1,000 mg 200 mL/hr over 60 Minutes Intravenous Every 8 hours 01/24/12 1740 01/25/12 1825          Medications:  Scheduled:   . sodium chloride   Intravenous Once  . aspirin EC  81 mg Oral Daily  . docusate sodium  100 mg Oral BID  . enoxaparin (LOVENOX) injection  40 mg Subcutaneous q1800  . etodolac  400 mg Oral Daily  . lisinopril  10 mg Oral Daily   And  . hydrochlorothiazide  12.5 mg Oral Daily  . levofloxacin  500 mg Oral Daily  . multivitamin with minerals  1 tablet Oral Daily  . nystatin  5 mL Oral QID  . vancomycin  750 mg Intravenous Q8H    Objective: Vital signs in last 24 hours: Temp:  [97.8 F (36.6 C)-98.3 F (36.8 C)] 98.3 F (36.8 C) (01/23 1106) Pulse Rate:  [69-78] 70  (01/23 1106) Resp:  [16-18] 16  (01/23 1106) BP: (99-119)/(57-65) 100/57 mmHg (01/23 1106) SpO2:  [99 %-100 %] 99 % (01/23 1106)   General appearance: alert, cooperative and no distress Resp: clear to auscultation bilaterally Cardio: regular rate and rhythm GI: normal findings: bowel sounds normal and soft, non-tender  Lab Results  Basename 01/25/12 2021 01/24/12 1855  01/24/12 1700  WBC -- 4.1 4.6  HGB -- 12.0* 11.7*  HCT -- 33.1* 32.2*  NA 133* -- 128*  K 3.1* -- 3.0*  CL 95* -- 91*  CO2 28 -- 25  BUN 19 -- 21  CREATININE 1.33 1.46* --  GLU -- -- --   Liver Panel No results found for this basename: PROT:2,ALBUMIN:2,AST:2,ALT:2,ALKPHOS:2,BILITOT:2,BILIDIR:2,IBILI:2 in the last 72 hours Sedimentation Rate  Basename 01/24/12 1700  ESRSEDRATE 56*   C-Reactive Protein  Basename 01/24/12 1700  CRP 6.2*    Microbiology: Recent Results (from the past 240 hour(s))  CULTURE, BLOOD (ROUTINE X 2)     Status: Normal (Preliminary result)   Collection Time   01/24/12  6:45 PM      Component Value Range Status Comment   Specimen Description BLOOD ARM LEFT   Final    Special Requests BOTTLES DRAWN AEROBIC AND ANAEROBIC 10CC   Final    Culture  Setup Time 01/25/2012 00:39   Final    Culture     Final    Value:        BLOOD CULTURE RECEIVED NO GROWTH TO DATE  CULTURE WILL BE HELD FOR 5 DAYS BEFORE ISSUING A FINAL NEGATIVE REPORT   Report Status PENDING   Incomplete   CULTURE, BLOOD (ROUTINE X 2)     Status: Normal (Preliminary result)   Collection Time   01/24/12  7:00 PM      Component Value Range Status Comment   Specimen Description BLOOD ARM LEFT   Final    Special Requests BOTTLES DRAWN AEROBIC AND ANAEROBIC 10CC   Final    Culture  Setup Time 01/25/2012 00:39   Final    Culture     Final    Value:        BLOOD CULTURE RECEIVED NO GROWTH TO DATE CULTURE WILL BE HELD FOR 5 DAYS BEFORE ISSUING A FINAL NEGATIVE REPORT   Report Status PENDING   Incomplete     Studies/Results: No results found.   Assessment/Plan: L4-5 laminectomy-decompression 11-08-11 Osteomyelitis/Discitis  Fever (? ceftriaxone ADR) Would-  Watch, plan for d/c if he continues to do well.  afebrile in hospital  Watch his BCx (minimal suspicion of infected PIC).  appreciate pharm help with vanco dosing Total days of antibiotics: 14 (vanco/levaqun)  Charles Navarro Infectious Diseases [phone] 01/27/2012, 11:22 AM   LOS: 3 days

## 2012-01-27 NOTE — Progress Notes (Signed)
Subjective: Patient reports Overall he is doing better on the pain medication although his pain level is unchanged no new radicular symptoms  Objective: Vital signs in last 24 hours: Temp:  [97.8 F (36.6 C)-98.3 F (36.8 C)] 98.2 F (36.8 C) (01/23 0600) Pulse Rate:  [69-78] 78  (01/23 0600) Resp:  [18] 18  (01/23 0600) BP: (99-119)/(59-65) 113/65 mmHg (01/23 0600) SpO2:  [100 %] 100 % (01/23 0600)  Intake/Output from previous day: 01/22 0701 - 01/23 0700 In: -  Out: 1200 [Urine:1200] Intake/Output this shift:    strength is 5 out of 5  Lab Results:  Basename 01/24/12 1855 01/24/12 1700  WBC 4.1 4.6  HGB 12.0* 11.7*  HCT 33.1* 32.2*  PLT 122* 128*   BMET  Basename 01/25/12 2021 01/24/12 1855 01/24/12 1700  NA 133* -- 128*  K 3.1* -- 3.0*  CL 95* -- 91*  CO2 28 -- 25  GLUCOSE 111* -- 114*  BUN 19 -- 21  CREATININE 1.33 1.46* --  CALCIUM 8.7 -- 9.0    Studies/Results: No results found.  Assessment/Plan: Continue antibiotics continue mobilization physical therapy work towards discharge tomorrow  LOS: 3 days     Royanne Warshaw P 01/27/2012, 10:01 AM

## 2012-01-28 LAB — VANCOMYCIN, TROUGH: Vancomycin Tr: 22.7 ug/mL — ABNORMAL HIGH (ref 10.0–20.0)

## 2012-01-28 MED ORDER — VANCOMYCIN HCL IN DEXTROSE 1-5 GM/200ML-% IV SOLN: 1250.0000 mg | Freq: Two times a day (BID) | INTRAVENOUS | Status: DC

## 2012-01-28 MED ORDER — CYCLOBENZAPRINE HCL 10 MG PO TABS: 10.0000 mg | ORAL_TABLET | Freq: Three times a day (TID) | ORAL | Status: DC | PRN

## 2012-01-28 MED ORDER — HYDROMORPHONE HCL 2 MG PO TABS: 2.0000 mg | ORAL_TABLET | ORAL | Status: DC | PRN

## 2012-01-28 MED ORDER — VANCOMYCIN HCL 10 G IV SOLR: 1250.0000 mg | Freq: Two times a day (BID) | INTRAVENOUS | Status: DC

## 2012-01-28 MED ORDER — ETODOLAC 400 MG PO TABS: 400.0000 mg | ORAL_TABLET | Freq: Every day | ORAL | Status: DC

## 2012-01-28 MED ORDER — LEVOFLOXACIN 500 MG PO TABS: 500.0000 mg | ORAL_TABLET | Freq: Every day | ORAL | Status: DC

## 2012-01-28 MED ORDER — SODIUM CHLORIDE 0.9 % IV SOLN
1250.0000 mg | Freq: Two times a day (BID) | INTRAVENOUS | Status: DC
  Filled 2012-01-28 (×2): qty 1250

## 2012-01-28 MED ORDER — HEPARIN SOD (PORK) LOCK FLUSH 100 UNIT/ML IV SOLN
250.0000 [IU] | INTRAVENOUS | Status: AC | PRN
  Administered 2012-01-28: 250 [IU]

## 2012-01-28 NOTE — Progress Notes (Signed)
INFECTIOUS DISEASE PROGRESS NOTE  ID: Charles Navarro is a 62 y.o. male with  Active Problems:  * No active hospital problems. *   Subjective: Without complaints. Some concern about back swelling.   Abtx:  Anti-infectives     Start     Dose/Rate Route Frequency Ordered Stop   01/26/12 0200   vancomycin (VANCOCIN) 750 mg in sodium chloride 0.9 % 150 mL IVPB        750 mg 150 mL/hr over 60 Minutes Intravenous Every 8 hours 01/25/12 1825     01/25/12 1515   levofloxacin (LEVAQUIN) tablet 500 mg        500 mg Oral Daily 01/25/12 1503     01/24/12 1745   cefTRIAXone (ROCEPHIN) 2 g in dextrose 5 % 50 mL IVPB  Status:  Discontinued        2 g 100 mL/hr over 30 Minutes Intravenous Every 24 hours 01/24/12 1740 01/25/12 1503   01/24/12 1745   vancomycin (VANCOCIN) IVPB 1000 mg/200 mL premix  Status:  Discontinued        1,000 mg 200 mL/hr over 60 Minutes Intravenous Every 8 hours 01/24/12 1740 01/25/12 1825          Medications:  Scheduled:   . sodium chloride   Intravenous Once  . aspirin EC  81 mg Oral Daily  . docusate sodium  100 mg Oral BID  . enoxaparin (LOVENOX) injection  40 mg Subcutaneous q1800  . etodolac  400 mg Oral Daily  . lisinopril  10 mg Oral Daily   And  . hydrochlorothiazide  12.5 mg Oral Daily  . levofloxacin  500 mg Oral Daily  . multivitamin with minerals  1 tablet Oral Daily  . nystatin  5 mL Oral QID  . vancomycin  750 mg Intravenous Q8H    Objective: Vital signs in last 24 hours: Temp:  [97.4 F (36.3 C)-99.1 F (37.3 C)] 99.1 F (37.3 C) (01/24 0601) Pulse Rate:  [61-71] 71  (01/24 0601) Resp:  [18-20] 20  (01/24 0601) BP: (105-119)/(58-76) 119/76 mmHg (01/24 0601) SpO2:  [98 %-100 %] 100 % (01/24 0601)   General appearance: alert, cooperative and no distress Incision/Wound: wound is well healed, no increase in heat. There is some softness superiorly.   Lab Results  Basename 01/25/12 2021  WBC --  HGB --  HCT --  NA 133*  K 3.1*    CL 95*  CO2 28  BUN 19  CREATININE 1.33  GLU --   Liver Panel No results found for this basename: PROT:2,ALBUMIN:2,AST:2,ALT:2,ALKPHOS:2,BILITOT:2,BILIDIR:2,IBILI:2 in the last 72 hours Sedimentation Rate No results found for this basename: ESRSEDRATE in the last 72 hours C-Reactive Protein No results found for this basename: CRP:2 in the last 72 hours  Microbiology: Recent Results (from the past 240 hour(s))  CULTURE, BLOOD (ROUTINE X 2)     Status: Normal (Preliminary result)   Collection Time   01/24/12  6:45 PM      Component Value Range Status Comment   Specimen Description BLOOD ARM LEFT   Final    Special Requests BOTTLES DRAWN AEROBIC AND ANAEROBIC 10CC   Final    Culture  Setup Time 01/25/2012 00:39   Final    Culture     Final    Value:        BLOOD CULTURE RECEIVED NO GROWTH TO DATE CULTURE WILL BE HELD FOR 5 DAYS BEFORE ISSUING A FINAL NEGATIVE REPORT   Report Status PENDING  Incomplete   CULTURE, BLOOD (ROUTINE X 2)     Status: Normal (Preliminary result)   Collection Time   01/24/12  7:00 PM      Component Value Range Status Comment   Specimen Description BLOOD ARM LEFT   Final    Special Requests BOTTLES DRAWN AEROBIC AND ANAEROBIC 10CC   Final    Culture  Setup Time 01/25/2012 00:39   Final    Culture     Final    Value:        BLOOD CULTURE RECEIVED NO GROWTH TO DATE CULTURE WILL BE HELD FOR 5 DAYS BEFORE ISSUING A FINAL NEGATIVE REPORT   Report Status PENDING   Incomplete     Studies/Results: No results found.   Assessment/Plan: L4-5 laminectomy-decompression 11-08-11  Osteomyelitis/Discitis  Fever (? ceftriaxone ADR)  Would-  plan for d/c today. Has f/u in ID clinic on 1-27 afebrile in hospital  His BCx (minimal suspicion of infected PIC).   Total days of antibiotics: 15 (vanco/levaqun)   [name] Infectious Diseases [phone] 01/28/2012, 11:58 AM   LOS: 4 days

## 2012-01-28 NOTE — Progress Notes (Signed)
Subjective: Patient reports Is feeling about the same pain is under better control with the Dilaudid and the Lodine  Objective: Vital signs in last 24 hours: Temp:  [97.4 F (36.3 C)-99.1 F (37.3 C)] 99.1 F (37.3 C) (01/24 0601) Pulse Rate:  [61-71] 71  (01/24 0601) Resp:  [18-20] 20  (01/24 0601) BP: (105-119)/(58-76) 119/76 mmHg (01/24 0601) SpO2:  [98 %-100 %] 100 % (01/24 0601)  Intake/Output from previous day: 01/23 0701 - 01/24 0700 In: -  Out: 800 [Urine:800] Intake/Output this shift:    Awake alert oriented neurologically intact little baseline numbness in his toes  Lab Results: No results found for this basename: WBC:2,HGB:2,HCT:2,PLT:2 in the last 72 hours BMET  [facility] 01/25/12 2021  NA 133*  K 3.1*  CL 95*  CO2 28  GLUCOSE 111*  BUN 19  CREATININE 1.33  CALCIUM 8.7    Studies/Results: No results found.  Assessment/Plan: Discharged home with home health  LOS: 4 days     Kathlean Cinco P 01/28/2012, 12:14 PM

## 2012-01-28 NOTE — Discharge Summary (Signed)
Physician Discharge Summary  Patient ID: Charles Navarro MRN: [id number] DOB/AGE: 1950-06-21 62 y.o.  Admit date: 01/24/2012 Discharge date: 01/28/2012  Admission Diagnoses: L4-5 discitis osteomyelitis  Discharge Diagnoses: Same Active Problems:  * No active hospital problems. *    Discharged Condition: good  Hospital Course: Patient was admitted hospital underwent a reevaluation with blood cultures and lab work she was admitted with severe back pain still no radicular pain workup with blood cultures still were negative sedimentation rate was stable at 36 and 52 creatinine was slightly elevated when he was discharged 1.4 but this came down progressively during his hospitalization he was explaining complaining of itching numbness and redness in his chest so his Rocephin was discontinued is felt to be an allergy to the cephalosporin and he was changed over to Levaquin. Infectious disease and Dr. Ninetta Lights evaluate the patient as well we observed in the get his pain under better control with a lumbar corset and we will progressively mobilize him with physical therapy in a stable for discharge home now. He'll be discharged back - Levaquin per his home health.  Consults: Significant Diagnostic Studies: Treatments: Discharge Exam: Blood pressure 119/76, pulse 71, temperature 99.1 F (37.3 C), temperature source Oral, resp. rate 20, height 6\' 6"  (1.981 m), weight 117.618 kg (259 lb 4.8 oz), SpO2 100.00%. Strength 5 out of 5  Disposition: Home  Discharge Orders    Future Appointments: Provider: Department: Dept Phone: Center:   01/31/2012 3:00 PM Cliffton Asters, MD [facility] for [facility] [phone] [facility]       Medication List     As of 01/28/2012 12:16 PM    TAKE these medications         aspirin EC 81 MG tablet   Take 81 mg by mouth daily.      cyclobenzaprine 10 MG tablet   Commonly known as: FLEXERIL   Take 10 mg by mouth 3 (three) times daily as needed. For  muscles spasms      cyclobenzaprine 10 MG tablet   Commonly known as: FLEXERIL   Take 1 tablet (10 mg total) by mouth 3 (three) times daily as needed for muscle spasms.      dextrose 5 % SOLN 50 mL with cefTRIAXone 2 G SOLR 2 g   Inject 2 g into the vein daily.      etodolac 400 MG tablet   Commonly known as: LODINE   Take 400 mg by mouth 2 (two) times daily as needed. For pain      etodolac 400 MG tablet   Commonly known as: LODINE   Take 1 tablet (400 mg total) by mouth daily.      Fish Oil 1000 MG Caps   Take 1,000 mg by mouth 2 (two) times daily.      HYDROmorphone 2 MG tablet   Commonly known as: DILAUDID   Take 1 tablet (2 mg total) by mouth every 4 (four) hours as needed.      ibuprofen 800 MG tablet   Commonly known as: ADVIL,MOTRIN   Take 800 mg by mouth every 8 (eight) hours as needed. For pain      lisinopril-hydrochlorothiazide 10-12.5 MG per tablet   Commonly known as: PRINZIDE,ZESTORETIC   Take 1 tablet by mouth daily.      MAG-OX PO   Take 1 tablet by mouth daily.      Methylsulfonylmethane 1000 MG Caps   Take 1,000 mg by mouth 2 (two) times daily.  multivitamin with minerals Tabs   Take 1 tablet by mouth daily.      oxyCODONE-acetaminophen 5-325 MG per tablet   Commonly known as: PERCOCET/ROXICET   Take 1-2 tablets by mouth every 4 (four) hours as needed. For pain      vancomycin 1 GM/200ML Soln   Commonly known as: VANCOCIN   Inject 200 mLs (1,000 mg total) into the vein every 8 (eight) hours.      Vitamin D 2000 UNITS Caps   Take 1 capsule by mouth 2 (two) times daily.         Signed: Bogdan Vivona P 01/28/2012, 12:16 PM

## 2012-01-28 NOTE — Progress Notes (Signed)
ANTIBIOTIC CONSULT NOTE - FOLLOW UP  Pharmacy Consult for Vancomycin Indication: osteomyelitis/diskitis  Allergies  Allergen Reactions  . Codeine Nausea And Vomiting    dizziness  . Tramadol Nausea And Vomiting    Patient Measurements: Height: 6\' 6"  (198.1 cm) Weight: 259 lb 4.8 oz (117.618 kg) IBW/kg (Calculated) : 91.4   Vital Signs: Temp: 99.1 F (37.3 C) (01/24 0601) Temp src: Oral (01/24 0601) BP: 119/76 mmHg (01/24 0601) Pulse Rate: 71  (01/24 0601) Intake/Output from previous day: 01/23 0701 - 01/24 0700 In: -  Out: 800 [Urine:800]  Labs:  [facility] 01/25/12 2021  WBC --  HGB --  PLT --  LABCREA --  CREATININE 1.33   Estimated Creatinine Clearance: 84.1 ml/min (by C-G formula based on Cr of 1.33).  Basename 01/28/12 1010 01/25/12 1605  VANCOTROUGH 22.7* 21.1*  VANCOPEAK -- --  Drue Dun -- --  GENTTROUGH -- --  GENTPEAK -- --  GENTRANDOM -- --  TOBRATROUGH -- --  TOBRAPEAK -- --  TOBRARND -- --  AMIKACINPEAK -- --  AMIKACINTROU -- --  AMIKACIN -- --    Assessment:   Day # 15 antibiotics. Currently on Vancomycin IV and Levaquin PO.  Off Ceftriaxone, none given this admission. (confirmed with Dr. Ninetta Lights.)  Vancomycin dose is above goal today.  About half of 12n dose had infused before RN and I spoke. Infusion stopped. Now being discharged, [facility] to help with outpatient dosing, to complete 6 weeks of therapy (thru 02/24/11.)     Vanc regimen adjusted from 1 gram IV q8h to 750 mg q8h on 2/21, but trough has not fallen into target range. Discussed with Milas Kocher, RPh at [facility].  Pt had been on Vanc 1500 mg IV q12hrs 1/15-1/18, then adjusted to 1500 mg IV q24h on 1/18, when Scr up to 1.64.  Most recent Scr 1.33 on 1/21.    Goal of Therapy:  Vancomycin trough level 15-20 mcg/ml  Plan:   Change Vancomycin to 1250 mg IV q12hrs.  Next trough level at steady state, expected on Monday 01/31/11.  Further dosing adjustments per [facility] pharmacists.  Bmet at least  weekly while on Vanc.  Vancomycin stop date 02/24/11  Dennie Fetters, [place] Pager: [phone] 01/28/2012,12:35 PM

## 2012-01-28 NOTE — Progress Notes (Signed)
[facility]  Patient Status: Active (receiving services up to time of hospitalization)  [facility] is providing the following services: RN and Home Infusion Services (teaching and education will be done by nurse in the home with patient and caregiver)  If patient discharges after hours, please call [phone].   Charles Navarro 01/28/2012, 11:13 AM

## 2012-01-31 ENCOUNTER — Encounter: Payer: Self-pay | Admitting: Internal Medicine

## 2012-01-31 ENCOUNTER — Ambulatory Visit (INDEPENDENT_AMBULATORY_CARE_PROVIDER_SITE_OTHER): Payer: Medicare Other | Admitting: Internal Medicine

## 2012-01-31 VITALS — BP 111/67 | HR 80 | Ht 78.0 in | Wt 260.0 lb

## 2012-01-31 DIAGNOSIS — M4646 Discitis, unspecified, lumbar region: Secondary | ICD-10-CM

## 2012-01-31 DIAGNOSIS — R110 Nausea: Secondary | ICD-10-CM

## 2012-01-31 DIAGNOSIS — K219 Gastro-esophageal reflux disease without esophagitis: Secondary | ICD-10-CM

## 2012-01-31 DIAGNOSIS — M519 Unspecified thoracic, thoracolumbar and lumbosacral intervertebral disc disorder: Secondary | ICD-10-CM

## 2012-01-31 DIAGNOSIS — R197 Diarrhea, unspecified: Secondary | ICD-10-CM

## 2012-01-31 DIAGNOSIS — R634 Abnormal weight loss: Secondary | ICD-10-CM | POA: Insufficient documentation

## 2012-01-31 LAB — CULTURE, BLOOD (ROUTINE X 2)
Culture: NO GROWTH
Culture: NO GROWTH

## 2012-01-31 NOTE — Progress Notes (Signed)
Patient ID: Charles Navarro, male   DOB: 1950-12-02, 62 y.o.   MRN: [id number]    [facility] for Infectious Disease  Patient Active Problem List  Diagnosis  . Discitis of lumbar region  . Diarrhea  . Nausea  . Acid reflux  . Weight loss, abnormal    Patient's Medications  New Prescriptions   No medications on file  Previous Medications   ASPIRIN EC 81 MG TABLET    Take 81 mg by mouth daily.   CHOLECALCIFEROL (VITAMIN D) 2000 UNITS CAPS    Take 1 capsule by mouth 2 (two) times daily.   CYCLOBENZAPRINE (FLEXERIL) 10 MG TABLET    Take 1 tablet (10 mg total) by mouth 3 (three) times daily as needed for muscle spasms.   ETODOLAC (LODINE) 400 MG TABLET    Take 1 tablet (400 mg total) by mouth daily.   FAMOTIDINE (PEPCID AC) 10 MG CHEWABLE TABLET    Chew 10 mg by mouth 2 (two) times daily.   HYDROMORPHONE (DILAUDID) 2 MG TABLET    Take 1 tablet (2 mg total) by mouth every 4 (four) hours as needed.   IBUPROFEN (ADVIL,MOTRIN) 800 MG TABLET    Take 800 mg by mouth every 8 (eight) hours as needed. For pain   LEVOFLOXACIN (LEVAQUIN) 500 MG TABLET    Take 1 tablet (500 mg total) by mouth daily.   LISINOPRIL-HYDROCHLOROTHIAZIDE (PRINZIDE,ZESTORETIC) 10-12.5 MG PER TABLET    Take 1 tablet by mouth daily.   MAGNESIUM OXIDE (MAG-OX PO)    Take 1 tablet by mouth daily.   METHYLSULFONYLMETHANE 1000 MG CAPS    Take 1,000 mg by mouth 2 (two) times daily.   MULTIPLE VITAMIN (MULTIVITAMIN WITH MINERALS) TABS    Take 1 tablet by mouth daily.   OMEGA-3 FATTY ACIDS (FISH OIL) 1000 MG CAPS    Take 1,000 mg by mouth 2 (two) times daily.   OXYCODONE-ACETAMINOPHEN (PERCOCET/ROXICET) 5-325 MG PER TABLET    Take 1-2 tablets by mouth every 4 (four) hours as needed. For pain   PROMETHAZINE (PHENERGAN) 25 MG TABLET    Take 25 mg by mouth every 6 (six) hours as needed.   SODIUM CHLORIDE 0.9 % SOLN 250 ML WITH VANCOMYCIN 10 G SOLR 1,250 MG    Inject 1,250 mg into the vein every 12 (twelve) hours.  Modified Medications   No medications on file  Discontinued Medications   CYCLOBENZAPRINE (FLEXERIL) 10 MG TABLET    Take 10 mg by mouth 3 (three) times daily as needed. For muscles spasms   DEXTROSE 5 % SOLN 50 ML WITH CEFTRIAXONE 2 G SOLR 2 G    Inject 2 g into the vein daily.   ETODOLAC (LODINE) 400 MG TABLET    Take 400 mg by mouth 2 (two) times daily as needed. For pain    Subjective: Charles Navarro is in for his hospital followup visit. He underwent surgical decompression of the left L4-5 region on November 14 of last year. He did well initially then began to have severe back pain in December and was readmitted in early January. The aspirate was obtained from the surgical site and the Gram stain showed gram-positive cocci in pairs but the culture was negative. He was treated empirically with IV vancomycin and ceftriaxone for postoperative discitis. After discharge he developed high fever and chills and was readmitted again. There was concern that he might have had an allergic reaction to ceftriaxone and it was changed to oral levofloxacin and vancomycin was  continued. He improved and was discharged with a plan to complete at least 6 weeks of total antibiotics on February 20.  Since discharge he has been able to get by with less pain medication. He generally takes one Percocet each morning and one dilaudid before bedtime. He has been able to do a little bit of more walking in his home but has not been able to restart usual activities. He has not had any problems tolerating the PICC or his current antibiotics. However, he is very concerned that he has an extremely poor appetite. He had been on a weight loss plan with his primary care physician last year but stopped this when he developed his infection. However he has continued to lose considerable weight since that time. He says that all food tastes like cardboard. He has been bothered by watery diarrhea for about 2-3 weeks. Over the past 48 hours his developed some reflux  symptoms and nausea. He has not had any fever.  Objective: BP: 111/67 mmHg (01/27 1501) Pulse Rate: 80  (01/27 1501)  General: His weight is down 44 pounds since October to 260. He is slightly uncomfortable due to back pain Skin: His right arm PICC site appears normal Lungs: Clear Cor: Regular S1 and S2 no murmurs Abdomen: Soft and nontender Lumbar incision is fully healed without inflammation  Lab Results  Component Value Date   CRP 6.2* 01/24/2012    Lab Results  Component Value Date   ESRSEDRATE 56* 01/24/2012      Assessment: He is now completed 18 days of antibiotic therapy and seems like his pain may be slightly better. I will continue his current empiric antibiotic therapy. However I am concerned about his weight loss, anorexia, nausea and diarrhea. I will check a C. difficile PCR to see if he is developing C. difficile colitis.  Plan: 1. Continue current antibiotics 2. Stool for C. difficile PCR   Cliffton Asters, MD [facility] for Infectious Disease [facility] [phone] pager   [phone] cell 01/31/2012, 5:16 PM

## 2012-02-01 ENCOUNTER — Other Ambulatory Visit: Payer: Medicare Other

## 2012-02-01 DIAGNOSIS — R197 Diarrhea, unspecified: Secondary | ICD-10-CM

## 2012-02-01 NOTE — Addendum Note (Signed)
Addended by: Jennet Maduro D on: 02/01/2012 02:29 PM   Modules accepted: Orders

## 2012-02-02 LAB — CLOSTRIDIUM DIFFICILE BY PCR: Toxigenic C. Difficile by PCR: NOT DETECTED

## 2012-02-11 LAB — FUNGUS CULTURE W SMEAR: Fungal Smear: NONE SEEN

## 2012-02-14 ENCOUNTER — Other Ambulatory Visit: Payer: Self-pay | Admitting: Nephrology

## 2012-02-14 DIAGNOSIS — N179 Acute kidney failure, unspecified: Secondary | ICD-10-CM

## 2012-02-15 ENCOUNTER — Telehealth: Payer: Self-pay | Admitting: *Deleted

## 2012-02-15 ENCOUNTER — Ambulatory Visit: Payer: Medicare Other | Admitting: Internal Medicine

## 2012-02-15 NOTE — Telephone Encounter (Signed)
Raynelle Fanning from [facility] called to advise that the patient PICC is clogged and he has not had medication since 02/09/12 she wants to pull the PICC. Advised her will ask the doctor if it is ok as it is not being used and is clogged. The patient missed his appt today here. Asked Dr Ninetta Lights and he advised OK to pull the PICC and make sure the patient keeps his appt  02/22/12. Will call Amil Amen and advise her of this. Amil Amen advised she will keep the patient open until 02/22/12 just in case we need them.

## 2012-02-15 NOTE — Telephone Encounter (Signed)
Pt stated that his PICC was clotted off and that his last dose of IV ABX was 02/09/12.  He was rescheduled for OV w/ Dr. Orvan Falconer for 02/22/12 @ 1115.

## 2012-02-15 NOTE — Telephone Encounter (Signed)
Made the pt a new appt for 02/22/12.

## 2012-02-18 ENCOUNTER — Other Ambulatory Visit: Payer: Medicare Other

## 2012-02-18 ENCOUNTER — Telehealth: Payer: Self-pay | Admitting: Infectious Disease

## 2012-02-18 NOTE — Telephone Encounter (Signed)
[name]  I  Received labs faxed to [facility] showing serum creatiine of 1.96 on Mr Charles Navarro, Looks like he was on vancomycin which has now been stopped and picc pulled. He was also on levaquin and with diarrhea and concern for possible C difficle colitis in January    an we make sure that he:  #1 stops his HCTZ, Lisinopril,  #2 stops his ibuprofen #3 drinks plenty of fluids #4 that his diarrhea is improved  And has visit with repeat labs with [place] or PCP on Monday?

## 2012-02-18 NOTE — Telephone Encounter (Signed)
Called and left patient a voice mail to call the clinic Charles Navarro

## 2012-02-19 ENCOUNTER — Other Ambulatory Visit: Payer: Self-pay

## 2012-02-21 ENCOUNTER — Ambulatory Visit
Admission: RE | Admit: 2012-02-21 | Discharge: 2012-02-21 | Disposition: A | Discharge status: Home / Self Care | Payer: Medicare Other | Source: Ambulatory Visit | Attending: Nephrology | Admitting: Nephrology

## 2012-02-21 DIAGNOSIS — N179 Acute kidney failure, unspecified: Secondary | ICD-10-CM

## 2012-02-22 ENCOUNTER — Ambulatory Visit (INDEPENDENT_AMBULATORY_CARE_PROVIDER_SITE_OTHER): Payer: Medicare Other | Admitting: Internal Medicine

## 2012-02-22 ENCOUNTER — Encounter: Payer: Self-pay | Admitting: Internal Medicine

## 2012-02-22 VITALS — BP 145/81 | HR 73 | Temp 97.7°F | Ht 78.0 in | Wt 254.5 lb

## 2012-02-22 DIAGNOSIS — M519 Unspecified thoracic, thoracolumbar and lumbosacral intervertebral disc disorder: Secondary | ICD-10-CM

## 2012-02-22 DIAGNOSIS — M4646 Discitis, unspecified, lumbar region: Secondary | ICD-10-CM

## 2012-02-22 LAB — SEDIMENTATION RATE: Sed Rate: 1 mm/hr (ref 0–16)

## 2012-02-22 LAB — COMPREHENSIVE METABOLIC PANEL
ALT: 12 U/L (ref 0–53)
AST: 16 U/L (ref 0–37)
Albumin: 3.8 g/dL (ref 3.5–5.2)
Alkaline Phosphatase: 115 U/L (ref 39–117)
BUN: 10 mg/dL (ref 6–23)
CO2: 27 mEq/L (ref 19–32)
Calcium: 9.1 mg/dL (ref 8.4–10.5)
Chloride: 101 mEq/L (ref 96–112)
Creat: 1.71 mg/dL — ABNORMAL HIGH (ref 0.50–1.35)
Glucose, Bld: 81 mg/dL (ref 70–99)
Potassium: 4.4 mEq/L (ref 3.5–5.3)
Sodium: 136 mEq/L (ref 135–145)
Total Bilirubin: 0.5 mg/dL (ref 0.3–1.2)
Total Protein: 5.6 g/dL — ABNORMAL LOW (ref 6.0–8.3)

## 2012-02-22 LAB — C-REACTIVE PROTEIN: CRP: 0.7 mg/dL — ABNORMAL HIGH (ref <0.60)

## 2012-02-22 NOTE — Progress Notes (Addendum)
Patient ID: Charles Navarro, male   DOB: [birth year], 62 y.o.   MRN: [id number]         [facility] for Infectious Disease  Patient Active Problem List  Diagnosis  . Discitis of lumbar region  . Diarrhea  . Nausea  . Acid reflux  . Weight loss, abnormal    Patient's Medications  New Prescriptions   No medications on file  Previous Medications   ASPIRIN EC 81 MG TABLET    Take 81 mg by mouth daily.   CHOLECALCIFEROL (VITAMIN D) 2000 UNITS CAPS    Take 1 capsule by mouth 2 (two) times daily.   CYCLOBENZAPRINE (FLEXERIL) 10 MG TABLET    Take 1 tablet (10 mg total) by mouth 3 (three) times daily as needed for muscle spasms.   FAMOTIDINE (PEPCID AC) 10 MG CHEWABLE TABLET    Chew 10 mg by mouth 2 (two) times daily.   HYDROMORPHONE (DILAUDID) 2 MG TABLET    Take 1 tablet (2 mg total) by mouth every 4 (four) hours as needed.   LEVOFLOXACIN (LEVAQUIN) 500 MG TABLET    Take 1 tablet (500 mg total) by mouth daily.   MAGNESIUM OXIDE (MAG-OX PO)    Take 1 tablet by mouth daily.   MULTIPLE VITAMIN (MULTIVITAMIN WITH MINERALS) TABS    Take 1 tablet by mouth daily.   OMEGA-3 FATTY ACIDS (FISH OIL) 1000 MG CAPS    Take 1,000 mg by mouth 2 (two) times daily.   OXYCODONE-ACETAMINOPHEN (PERCOCET/ROXICET) 5-325 MG PER TABLET    Take 1-2 tablets by mouth every 4 (four) hours as needed. For pain   PROMETHAZINE (PHENERGAN) 25 MG TABLET    Take 25 mg by mouth every 6 (six) hours as needed.  Modified Medications   No medications on file  Discontinued Medications   ETODOLAC (LODINE) 400 MG TABLET    Take 1 tablet (400 mg total) by mouth daily.   IBUPROFEN (ADVIL,MOTRIN) 800 MG TABLET    Take 800 mg by mouth every 8 (eight) hours as needed. For pain   LISINOPRIL-HYDROCHLOROTHIAZIDE (PRINZIDE,ZESTORETIC) 10-12.5 MG PER TABLET    Take 1 tablet by mouth daily.   METHYLSULFONYLMETHANE 1000 MG CAPS    Take 1,000 mg by mouth 2 (two) times daily.   SODIUM CHLORIDE 0.9 % SOLN 250 ML WITH VANCOMYCIN 10 G SOLR 1,250  MG    Inject 1,250 mg into the vein every 12 (twelve) hours.    Subjective: Mr. Nester is in for his routine followup visit. He completed vancomycin one week ago and the PICC removed. He remains on levofloxacin. He is now completed 6 weeks of antibiotic therapy for his culture negative postoperative lumbar infection. The lumbar aspirate Gram stain did show gram-positive cocci in pairs blood cultures were negative. He is feeling better but still having back pain. He feels like he has been able to be more active and is taking a little less Percocet and I wanted that he was one month ago. His diarrhea has resolved.  Objective: Temp: 97.7 F (36.5 C) (02/18 1048) Temp src: Oral (02/18 1048) BP: 145/81 mmHg (02/18 1048) Pulse Rate: 73 (02/18 1048)  General: is in good spirits Skin: no rash; former right arm PICC site looks good Lumbar incision is healed without any signs of inflammation  Lab Results  Component Value Date   CRP 6.2* 01/24/2012   Lab Results  Component Value Date   ESRSEDRATE 56* 01/24/2012     Assessment: I will recheck his inflammatory  markers and determine if it is reasonable to stop levofloxacin and observe off of antibiotics.  Plan: 1. Check sedimentation rate and C-reactive protein   Cliffton Asters, MD [facility] for Infectious Disease [facility] [phone] pager   [phone] cell 02/22/2012, 11:09 AM  Addendum:   Lab Results  Component Value Date   CRP 0.7* 02/22/2012   Lab Results  Component Value Date   ESRSEDRATE 1 02/22/2012   I left a message for him to stop levofloxacin tomorrow.  Jonny Ruiz

## 2012-02-23 NOTE — Telephone Encounter (Signed)
Please see OV notes and completed lab results.

## 2012-03-06 ENCOUNTER — Telehealth: Payer: Self-pay | Admitting: Licensed Clinical Social Worker

## 2012-03-06 ENCOUNTER — Ambulatory Visit (INDEPENDENT_AMBULATORY_CARE_PROVIDER_SITE_OTHER): Payer: Medicare Other | Admitting: Internal Medicine

## 2012-03-06 ENCOUNTER — Encounter: Payer: Self-pay | Admitting: Internal Medicine

## 2012-03-06 VITALS — BP 159/84 | HR 67 | Temp 97.5°F | Ht 78.0 in | Wt 261.0 lb

## 2012-03-06 DIAGNOSIS — L0292 Furuncle, unspecified: Secondary | ICD-10-CM | POA: Insufficient documentation

## 2012-03-06 DIAGNOSIS — L0293 Carbuncle, unspecified: Secondary | ICD-10-CM

## 2012-03-06 MED ORDER — DOXYCYCLINE HYCLATE 100 MG PO TABS: 100.0000 mg | ORAL_TABLET | Freq: Two times a day (BID) | ORAL | Status: DC

## 2012-03-06 NOTE — Telephone Encounter (Signed)
Patient called wanting an appointment to evaluate a blister on his waist that appeared shortly after his antibiotics ended. He complains of pain, and the blister is now draining. Appointment given for today with Dr. Orvan Falconer.

## 2012-03-06 NOTE — Progress Notes (Signed)
Patient ID: Charles Navarro, male   DOB: 20-Jan-1950, 62 y.o.   MRN: [id number]         [facility] for Infectious Disease  Patient Active Problem List  Diagnosis  . Discitis of lumbar region  . Diarrhea  . Nausea  . Acid reflux  . Weight loss, abnormal  . Boil    Patient's Medications  New Prescriptions   DOXYCYCLINE (VIBRA-TABS) 100 MG TABLET    Take 1 tablet (100 mg total) by mouth 2 (two) times daily.  Previous Medications   ASPIRIN EC 81 MG TABLET    Take 81 mg by mouth daily.   CHOLECALCIFEROL (VITAMIN D) 2000 UNITS CAPS    Take 1 capsule by mouth 2 (two) times daily.   CYCLOBENZAPRINE (FLEXERIL) 10 MG TABLET    Take 1 tablet (10 mg total) by mouth 3 (three) times daily as needed for muscle spasms.   FAMOTIDINE (PEPCID AC) 10 MG CHEWABLE TABLET    Chew 10 mg by mouth 2 (two) times daily.   GABAPENTIN (NEURONTIN) 300 MG CAPSULE    Take 300 mg by mouth daily.   HYDROMORPHONE (DILAUDID) 2 MG TABLET    Take 1 tablet (2 mg total) by mouth every 4 (four) hours as needed.   MULTIPLE VITAMIN (MULTIVITAMIN WITH MINERALS) TABS    Take 1 tablet by mouth daily.   OMEGA-3 FATTY ACIDS (FISH OIL) 1000 MG CAPS    Take 1,000 mg by mouth 2 (two) times daily.   OXYCODONE-ACETAMINOPHEN (PERCOCET/ROXICET) 5-325 MG PER TABLET    Take 1-2 tablets by mouth every 4 (four) hours as needed. For pain   PROMETHAZINE (PHENERGAN) 25 MG TABLET    Take 25 mg by mouth every 6 (six) hours as needed.  Modified Medications   No medications on file  Discontinued Medications   LEVOFLOXACIN (LEVAQUIN) 500 MG TABLET    Take 1 tablet (500 mg total) by mouth daily.   MAGNESIUM OXIDE (MAG-OX PO)    Take 1 tablet by mouth daily.    Subjective: Charles Navarro is seen on a work in basis. 5 days ago he began to notice some painful swelling on his right hip. He noted the area was firm and red. He denies any previous injury to the area. He did not have any fever, chills or sweats. 3 days ago he began to notice a little bit of  blood-tinged drainage and the area has been feeling a little bit better and getting a little smaller. His back pain is slowly improving. It is still quite bad and bothers her most at night when he is turning over in bed or when he first gets up. It tends to get a little bit better once he has been up and moving around.  Objective: Temp: 97.5 F (36.4 C) (03/03 1418) Temp src: Oral (03/03 1418) BP: 159/84 mmHg (03/03 1418) Pulse Rate: 67 (03/03 1418)  General: he is worried but in good spirits Skin: he has a quarter-sized boil right at the belt line on his right lateral hip. There is some blood-tinged drainage on his T-shirt. There is no expressible drainage from the lesion. It is firm but not fluctuant. There is some surrounding cellulitis. Lumbar incision: Healing well without any evidence of infection or inflammation Lab Results  Component Value Date   CRP 0.7* 02/22/2012   Lab Results  Component Value Date   ESRSEDRATE 1 02/22/2012    Assessment: He has developed a small, solitary boil on his right hip that  is probably unrelated to his recent postoperative lumbar infection. Has been off of antibiotics now since February 19. His back pain is slowly improving. I doubt that he has any persistence of back infection. I will give him a very brief course of oral doxycycline to treat him for the possibility of community-acquired MRSA causing a boil.  Plan: 1. Doxycycline 100 mg twice daily for 5 days 2. Followup in one month   Cliffton Asters, MD [facility] for Infectious Disease [facility] [phone] pager   [phone] cell 03/06/2012, 2:44 PM

## 2012-03-08 ENCOUNTER — Other Ambulatory Visit: Payer: Self-pay | Admitting: Neurosurgery

## 2012-03-08 ENCOUNTER — Telehealth: Payer: Self-pay | Admitting: *Deleted

## 2012-03-08 ENCOUNTER — Encounter: Payer: Self-pay | Admitting: Infectious Disease

## 2012-03-08 DIAGNOSIS — M545 Low back pain, unspecified: Secondary | ICD-10-CM

## 2012-03-08 NOTE — Telephone Encounter (Signed)
I have not been managing his pain. I suspect that he was prescribed narcotic pain medication by his surgeon, Dr. Donalee Citrin, and would have him check back with him about a refill.

## 2012-03-08 NOTE — Telephone Encounter (Signed)
Requesting Percocet rx for back pain.  RN told the pt she would send to request to Dr. Orvan Falconer.  RN also advised pt to call his neurologist/neurosurgeon for this medication to see if he would refill.  MD please advise.

## 2012-03-29 ENCOUNTER — Ambulatory Visit
Admission: RE | Admit: 2012-03-29 | Discharge: 2012-03-29 | Disposition: A | Discharge status: Home / Self Care | Payer: Medicare Other | Source: Ambulatory Visit | Attending: Neurosurgery | Admitting: Neurosurgery

## 2012-03-29 DIAGNOSIS — M545 Low back pain, unspecified: Secondary | ICD-10-CM

## 2012-03-29 MED ORDER — GADOBENATE DIMEGLUMINE 529 MG/ML IV SOLN
10.0000 mL | Freq: Once | INTRAVENOUS | Status: AC | PRN
  Administered 2012-03-29: 10 mL via INTRAVENOUS

## 2012-04-04 ENCOUNTER — Ambulatory Visit: Payer: Medicare Other | Admitting: Internal Medicine

## 2012-04-06 ENCOUNTER — Ambulatory Visit (INDEPENDENT_AMBULATORY_CARE_PROVIDER_SITE_OTHER): Payer: Medicare Other | Admitting: Internal Medicine

## 2012-04-06 ENCOUNTER — Encounter: Payer: Self-pay | Admitting: Internal Medicine

## 2012-04-06 VITALS — BP 153/95 | HR 60 | Temp 97.8°F | Ht 78.0 in | Wt 249.0 lb

## 2012-04-06 DIAGNOSIS — M519 Unspecified thoracic, thoracolumbar and lumbosacral intervertebral disc disorder: Secondary | ICD-10-CM

## 2012-04-06 DIAGNOSIS — M4646 Discitis, unspecified, lumbar region: Secondary | ICD-10-CM

## 2012-04-06 LAB — C-REACTIVE PROTEIN: CRP: <0.5 mg/dL (ref <0.60)

## 2012-04-06 LAB — SEDIMENTATION RATE: Sed Rate: 6 mm/hr (ref 0–16)

## 2012-04-06 NOTE — Progress Notes (Signed)
Patient ID: Charles Navarro, male   DOB: 10/18/1950, 62 y.o.   MRN: [id number]         [facility] for Infectious Disease  Patient Active Problem List  Diagnosis  . Discitis of lumbar region  . Diarrhea  . Nausea  . Acid reflux  . Weight loss, abnormal  . Boil    Patient's Medications  New Prescriptions   No medications on file  Previous Medications   ASPIRIN EC 81 MG TABLET    Take 81 mg by mouth daily.   CHOLECALCIFEROL (VITAMIN D) 2000 UNITS CAPS    Take 1 capsule by mouth 2 (two) times daily.   CYCLOBENZAPRINE (FLEXERIL) 10 MG TABLET    Take 1 tablet (10 mg total) by mouth 3 (three) times daily as needed for muscle spasms.   FAMOTIDINE (PEPCID AC) 10 MG CHEWABLE TABLET    Chew 10 mg by mouth 2 (two) times daily.   GABAPENTIN (NEURONTIN) 300 MG CAPSULE    Take 300 mg by mouth daily.   MULTIPLE VITAMIN (MULTIVITAMIN WITH MINERALS) TABS    Take 1 tablet by mouth daily.   OMEGA-3 FATTY ACIDS (FISH OIL) 1000 MG CAPS    Take 1,000 mg by mouth 2 (two) times daily.   OXYCODONE-ACETAMINOPHEN (PERCOCET/ROXICET) 5-325 MG PER TABLET    Take 1-2 tablets by mouth every 4 (four) hours as needed. For pain   PROMETHAZINE (PHENERGAN) 25 MG TABLET    Take 25 mg by mouth every 6 (six) hours as needed.  Modified Medications   No medications on file  Discontinued Medications   DOXYCYCLINE (VIBRA-TABS) 100 MG TABLET    Take 1 tablet (100 mg total) by mouth 2 (two) times daily.   HYDROMORPHONE (DILAUDID) 2 MG TABLET    Take 1 tablet (2 mg total) by mouth every 4 (four) hours as needed.    Subjective: Mr. Hannen is in for his routine followup visit. He has been off of antibiotics for his lumbar spine infections since February 19. He took a five-day course of doxycycline in early March for a small boil on his right hip. He has been on no other antibiotics recently. A small boil resolved promptly after starting doxycycline. He is still having considerable back pain. However, he has been more active  recently and has changed from taking oral dialogue of her his pain to Percocet about 6 weeks ago. He estimates that his only needing to take Percocet 5-10 times each week recently.  Objective: Temp: 97.8 F (36.6 C) (04/03 1006) Temp src: Oral (04/03 1006) BP: 153/95 mmHg (04/03 1006) Pulse Rate: 60 (04/03 1006)  General: He appears slightly worried and mildly uncomfortable due to pain Skin: The boil in his right hip has resolved Lungs: Clear Cor: Regular S1 and S2 no murmurs  Lab Results  Component Value Date   CRP 0.7* 02/22/2012   Lab Results  Component Value Date   ESRSEDRATE 1 02/22/2012   MRI LUMBAR SPINE WITHOUT AND WITH CONTRAST 03/29/2012   Contrast: 10mL MULTIHANCE GADOBENATE DIMEGLUMINE 529 MG/ML IV SOLN   IMPRESSION:  Interval improvement in the diskitis and osteomyelitis at L4-5.  Decreased pus in the disc space with decreased enhancement of the  paraspinal soft tissues. No appreciable residual epidural abscess  although there is soft tissue thickening anterior to the thecal sac  at L4-5 with enhancement.   Original Report Authenticated By: Francene Boyers, M.D.      Assessment: Although he is having persistent back pain I suspect  that his lumbar discitis has resolved. His MRI done recently shows steady improvement and his last inflammatory markers had improved considerably. I will repeat his sedimentation rate and C-reactive protein and continue observation off of antibiotics for now.  Plan: 1. Continue observation off of antibiotics 2. Repeat sedimentation rate and C-reactive protein 3. Followup in 6 weeks   Cliffton Asters, MD [facility] for Infectious Disease [facility] [phone] pager   [phone] cell 04/06/2012, 10:22 AM

## 2012-05-23 ENCOUNTER — Encounter: Payer: Self-pay | Admitting: Internal Medicine

## 2012-05-23 ENCOUNTER — Ambulatory Visit (INDEPENDENT_AMBULATORY_CARE_PROVIDER_SITE_OTHER): Payer: Medicare Other | Admitting: Internal Medicine

## 2012-05-23 VITALS — BP 159/89 | HR 65 | Temp 97.6°F | Wt 263.8 lb

## 2012-05-23 DIAGNOSIS — L0292 Furuncle, unspecified: Secondary | ICD-10-CM

## 2012-05-23 DIAGNOSIS — M4646 Discitis, unspecified, lumbar region: Secondary | ICD-10-CM

## 2012-05-23 DIAGNOSIS — M519 Unspecified thoracic, thoracolumbar and lumbosacral intervertebral disc disorder: Secondary | ICD-10-CM

## 2012-05-23 NOTE — Progress Notes (Signed)
Patient ID: Charles Navarro, male   DOB: [birth year], 62 y.o.   MRN: [id number]         [facility] for Infectious Disease  Patient Active Problem List   Diagnosis Date Noted  . Discitis of lumbar region 01/31/2012    Priority: High  . Boil 03/06/2012  . Diarrhea 01/31/2012  . Nausea 01/31/2012  . Acid reflux 01/31/2012  . Weight loss, abnormal 01/31/2012    Patient's Medications  New Prescriptions   No medications on file  Previous Medications   ASPIRIN EC 81 MG TABLET    Take 81 mg by mouth daily.   CHOLECALCIFEROL (VITAMIN D) 2000 UNITS CAPS    Take 1 capsule by mouth 2 (two) times daily.   CYCLOBENZAPRINE (FLEXERIL) 10 MG TABLET    Take 1 tablet (10 mg total) by mouth 3 (three) times daily as needed for muscle spasms.   FAMOTIDINE (PEPCID AC) 10 MG CHEWABLE TABLET    Chew 10 mg by mouth 2 (two) times daily.   GABAPENTIN (NEURONTIN) 300 MG CAPSULE    Take 300 mg by mouth daily.   MULTIPLE VITAMIN (MULTIVITAMIN WITH MINERALS) TABS    Take 1 tablet by mouth daily.   OMEGA-3 FATTY ACIDS (FISH OIL) 1000 MG CAPS    Take 1,000 mg by mouth 2 (two) times daily.   OXYCODONE-ACETAMINOPHEN (PERCOCET/ROXICET) 5-325 MG PER TABLET    Take 1-2 tablets by mouth every 4 (four) hours as needed. For pain   PROMETHAZINE (PHENERGAN) 25 MG TABLET    Take 25 mg by mouth every 6 (six) hours as needed.  Modified Medications   No medications on file  Discontinued Medications   No medications on file    Subjective: He has now been off of antibiotics since February for his lumbar discitis. He is having slow but steady improvement in his low back pain. He estimates that his pain is a 2-3 on a scale of 1-10 on a daily basis. This is an improvement. He still needs about 4 Percocet to control his pain daily along with Flexeril and Neurontin but is requiring less Percocet as the months go by.  He has not had any recurrent boils and has not needed to be on antibiotics for anything else since his last  visit. Review of Systems: A comprehensive review of systems was negative.  Past Medical History  Diagnosis Date  . Hypertension     takes meds daily  . Depression   . Vertigo   . Bronchitis     hx of    History  Substance Use Topics  . Smoking status: Never Smoker   . Smokeless tobacco: Not on file  . Alcohol Use: No    Family History  Problem Relation Age of Onset  . Alcoholism Father     Allergies  Allergen Reactions  . Codeine Nausea And Vomiting    dizziness  . Tramadol Nausea And Vomiting    Objective: Temp: 97.6 F (36.4 C) (05/20 1022) Temp src: Oral (05/20 1022) BP: 159/89 mmHg (05/20 1022) Pulse Rate: 65 (05/20 1022)  General: he is in good spirits and in no distress Skin: no rash Lungs: clear Cor: regular S1 and S2 no murmurs Healed lumbar incision He continues to walk with the aid of a cane but has a normal gait   Lab Results  Component Value Date   CRP <0.5 04/06/2012   Lab Results  Component Value Date   ESRSEDRATE 6 04/06/2012     Assessment:  I strongly suspect that his lumbar infection has been cured. He is improving 3 months after completing antibiotic therapy and his inflammatory markers have returned to normal.  He has had no recurrence of his boils.  Plan: 1. Continue observation off of antibiotics 2. Followup here as needed   Cliffton Asters, MD [facility] for Infectious Disease [facility] [phone] pager   [phone] cell 05/23/2012, 10:40 AM

## 2012-11-09 ENCOUNTER — Other Ambulatory Visit: Payer: Self-pay

## 2014-04-07 IMAGING — CR DG CHEST 2V
3 series · 3 of 3 positions shown · non-contrast
Comparison: None available.

CLINICAL DATA: Lumbar laminectomy.  Preop

CHEST - 2 VIEW

[view not recorded (1 of 3)]
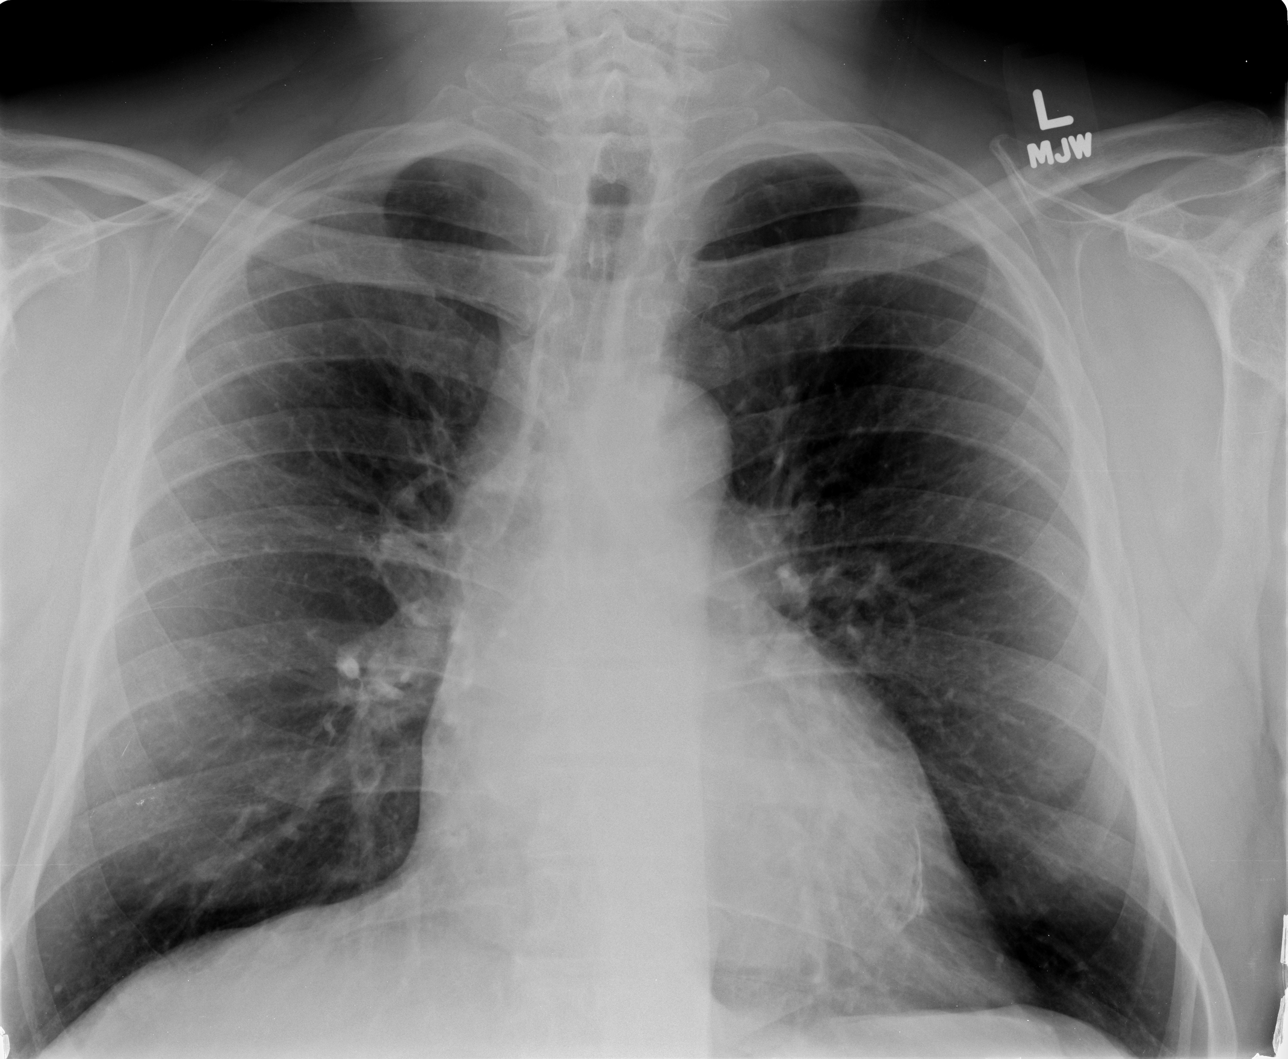

[view not recorded (2 of 3)]
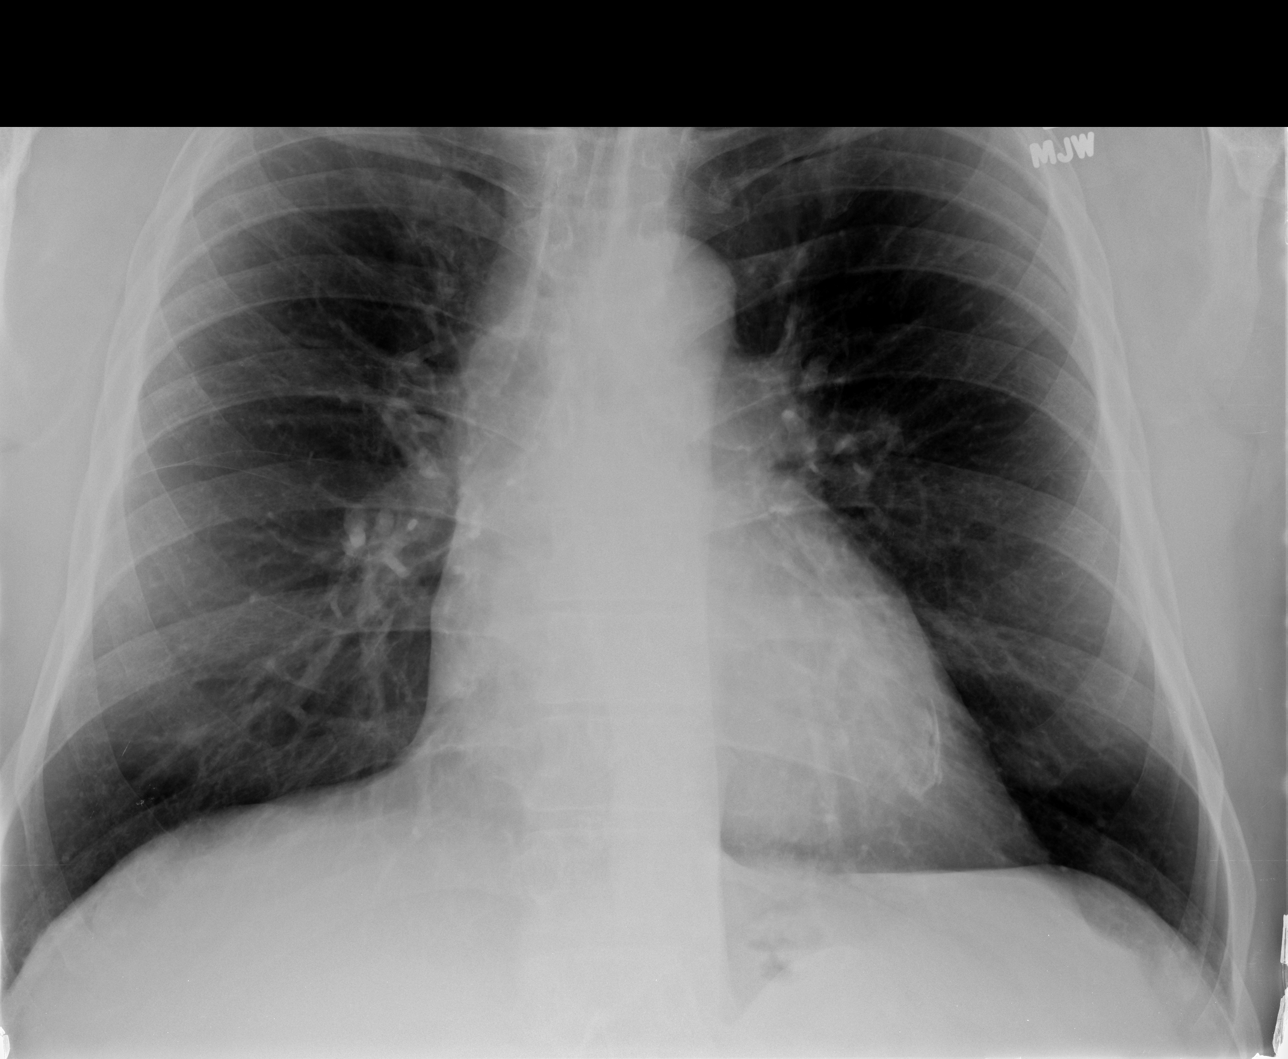

[view not recorded (3 of 3)]
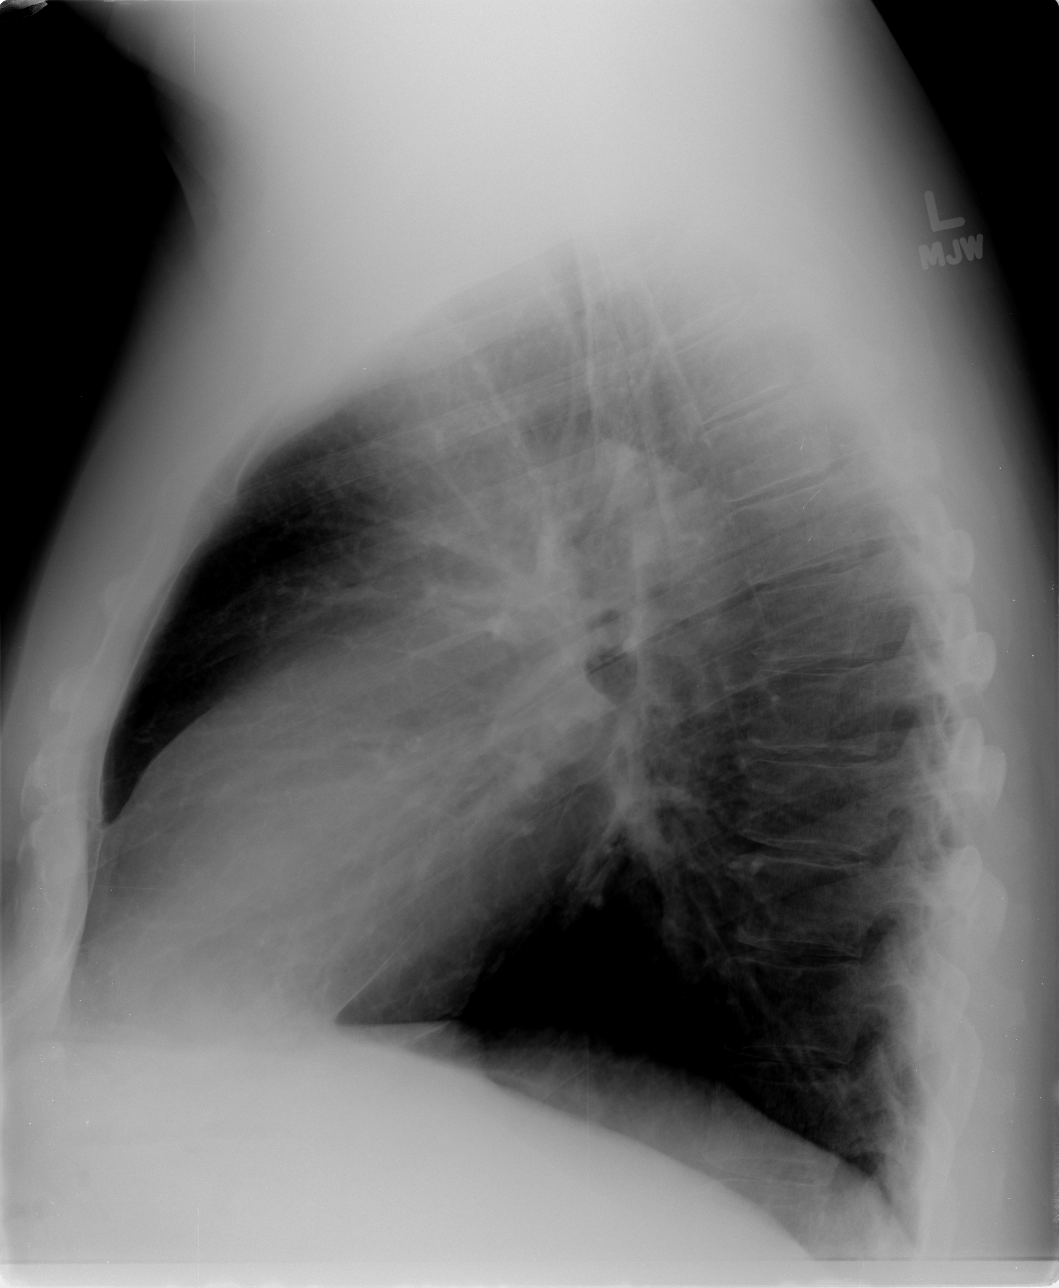

[3 of 3 positions shown; findings below may reference images not displayed]

FINDINGS: Cardiac silhouette is normal size.  There is a
curvilinear calcification projecting over the left heart border
which may represent a left ventricular calcification.  This is not
seen on the lateral projection.   No effusion, infiltrate,
pneumothorax. No acute osseous abnormality.
IMPRESSION: 1..  No acute cardiopulmonary process.
2.  Potential left ventricular calcifications could represent an
aneurysm or infarction..

## 2014-06-10 DIAGNOSIS — Z719 Counseling, unspecified: Secondary | ICD-10-CM | POA: Diagnosis not present

## 2014-07-08 IMAGING — XA IR FLUORO GUIDE SPINAL/SI JT INJ*L*
3 series · 6 of 6 positions shown · non-contrast
Comparison: MRI scan of the lumbosacral spine of a few days ago.

CLINICAL HISTORY: Patient with severe low back pain.  Abnormal MRI
of the lumbosacral spine suggestive of diskitis at L4-L5.

FLUOROSCOPIC GUIDED ASPIRATION OF L4-5 DISC SPACE

[Series 1: myelo · 2 of 2 slices shown (1 of 3)]
[im 1/2]
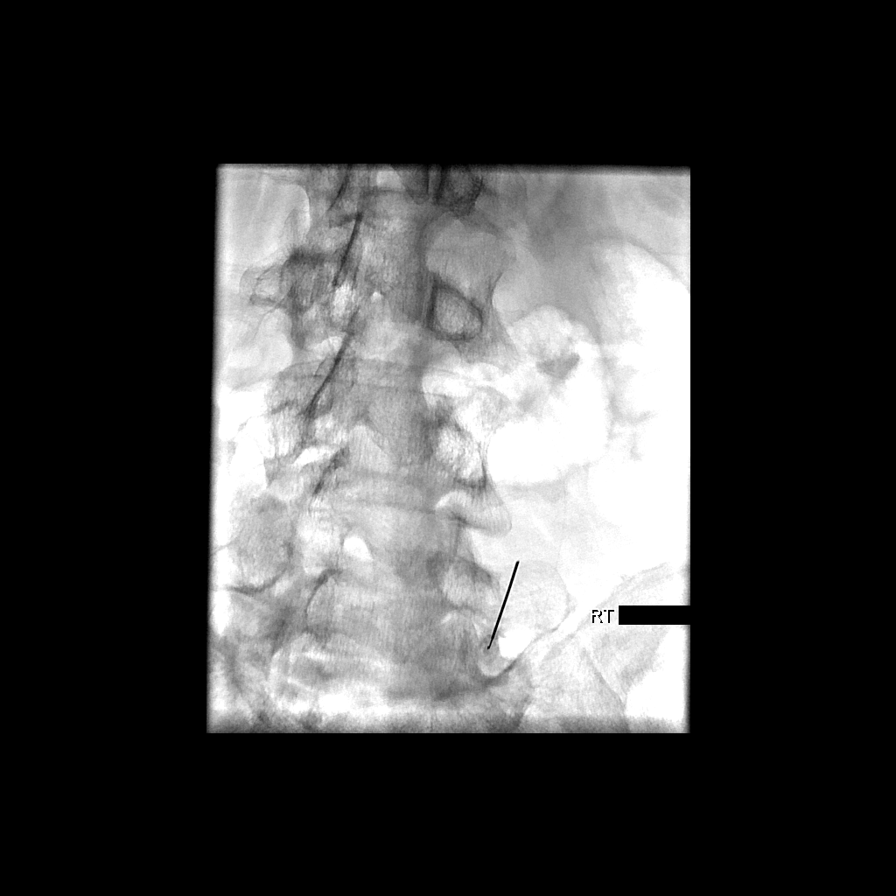
[im 2/2]
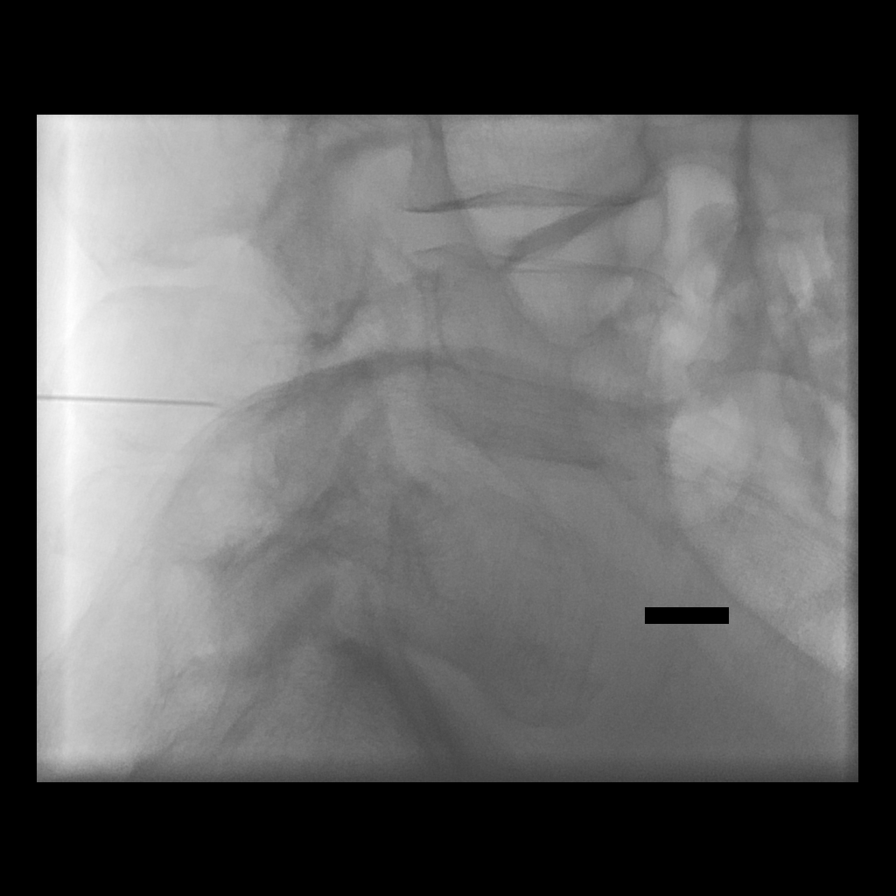

[Series 2: myelo · 2 of 2 slices shown (2 of 3)]
[im 1/2]
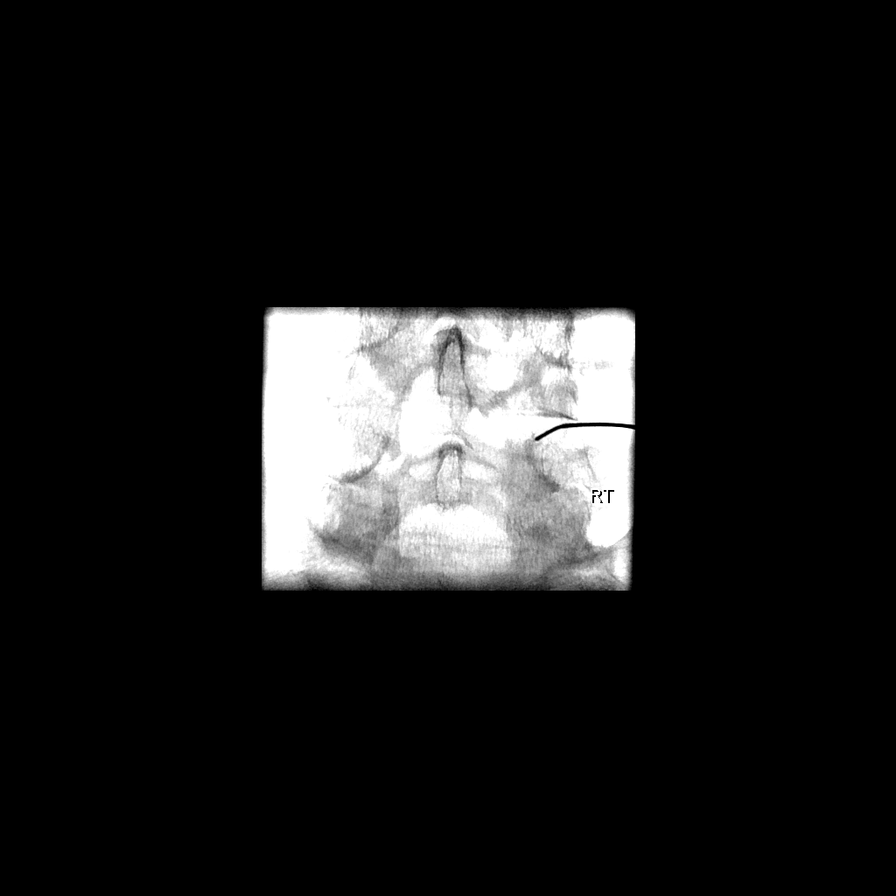
[im 2/2]
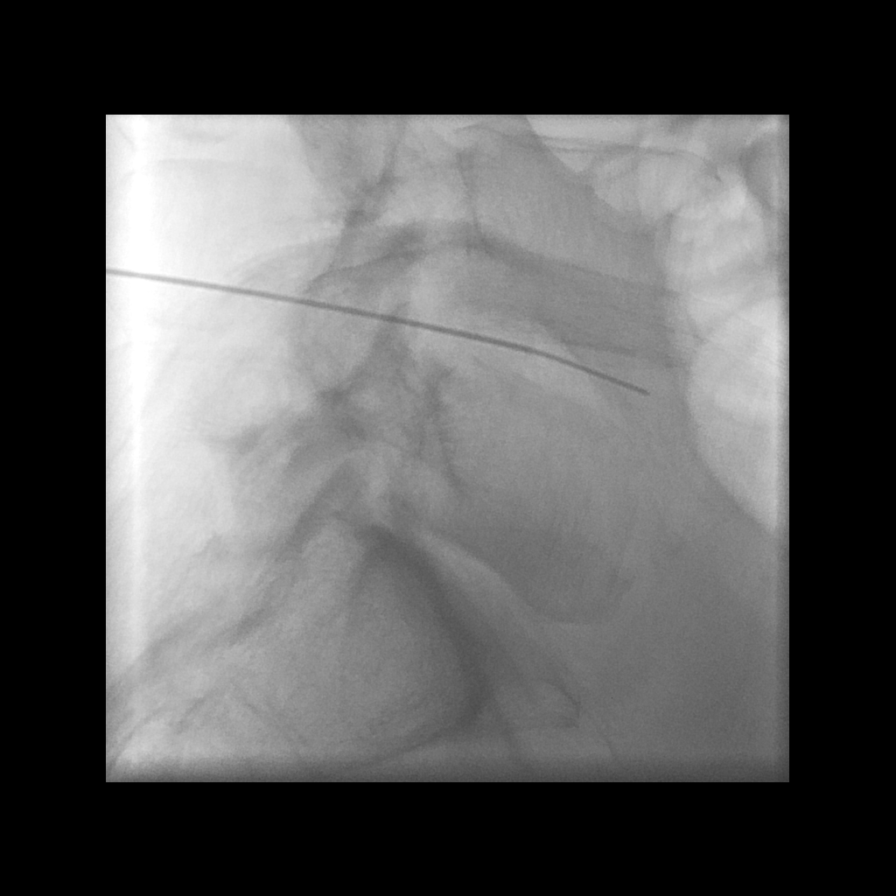

[Series 3: myelo · 2 of 2 slices shown (3 of 3)]
[im 1/2]
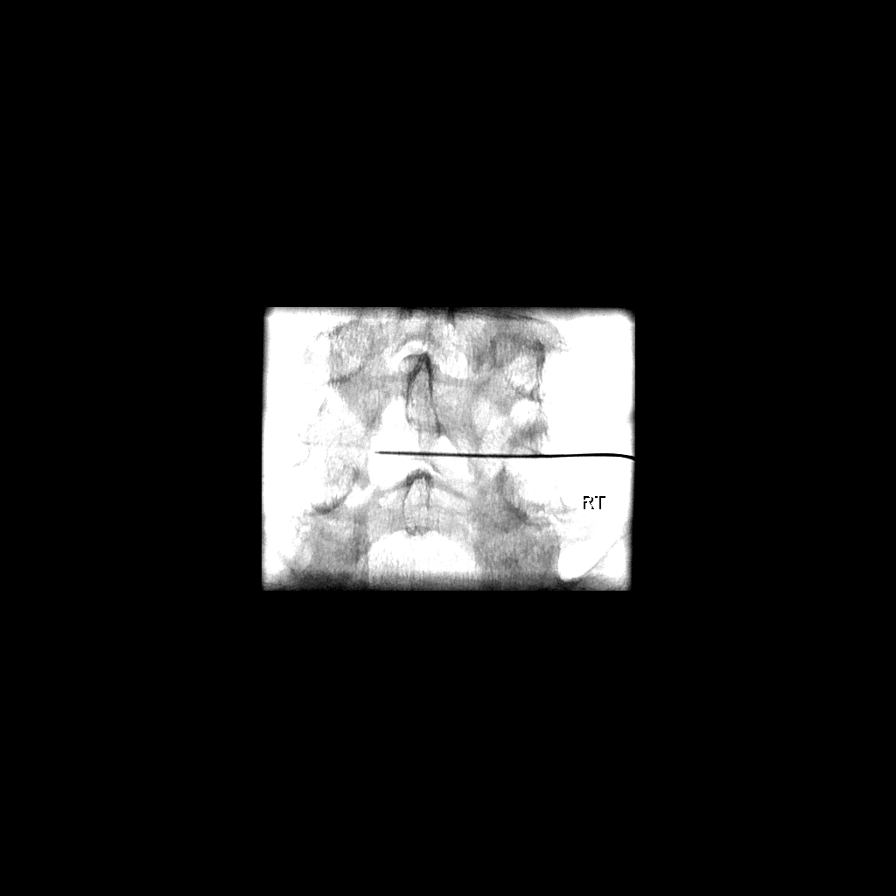
[im 2/2]
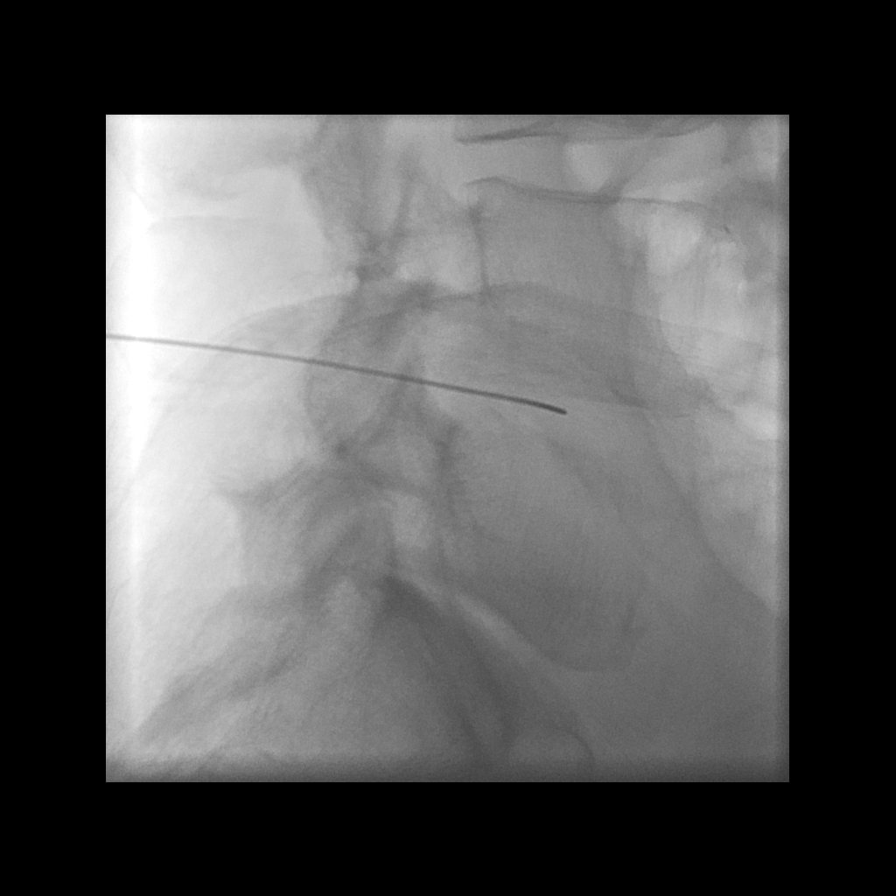

[6 of 6 positions shown; findings below may reference images not displayed]

Following a full explanation of the procedure along with the
potential associated complications, an informed witnessed consent
was obtained.

The patient was placed prone on the fluoroscopic table.  The skin
overlying the lumbar region was then prepped and draped in the
usual sterile fashion.

The L4-L5 disk place was then identified.  The skin overlying the
right paramedian approach was then infiltrated with 0.25%
bupivacaine and carried into the underlying musculature.

Using biplane intermittent fluoroscopy, using a coaxial system, a
20[employer]xle[employer] needle which had been shaped was advanced into the L4-
L5 disc space.  Using a 20 ml syringe, the needle was then
withdrawn, with withdrawal of approximately 0.5 ml of blood-stained
liquid.  This was then sent for microbiological analysis.

A second pass was made as described above, this time traversing the
midline at the L4-L5.  Again approximately 1 ml of aspirate was
obtained and sent for microbiological analysis.

The needle was then removed.  Hemostasis was achieved at the skin
entry site.

The patient tolerated the procedure well.  There were no acute
complications.

Medications utilized: Versed 4 mg IV.  Fentanyl 100 mcg IV.

IMPRESSION
1.  Status post fluoroscopic guided disc aspiration at L4-L5 for
suspected diskitis.

## 2014-12-27 DIAGNOSIS — L0100 Impetigo, unspecified: Secondary | ICD-10-CM | POA: Diagnosis not present

## 2015-01-04 DIAGNOSIS — R21 Rash and other nonspecific skin eruption: Secondary | ICD-10-CM | POA: Diagnosis not present

## 2015-01-04 DIAGNOSIS — K219 Gastro-esophageal reflux disease without esophagitis: Secondary | ICD-10-CM | POA: Diagnosis not present

## 2015-01-04 DIAGNOSIS — L299 Pruritus, unspecified: Secondary | ICD-10-CM | POA: Diagnosis not present

## 2015-01-04 DIAGNOSIS — T368X5A Adverse effect of other systemic antibiotics, initial encounter: Secondary | ICD-10-CM | POA: Diagnosis not present

## 2015-01-04 DIAGNOSIS — I10 Essential (primary) hypertension: Secondary | ICD-10-CM | POA: Diagnosis not present

## 2015-01-04 DIAGNOSIS — T370X5A Adverse effect of sulfonamides, initial encounter: Secondary | ICD-10-CM | POA: Diagnosis not present

## 2015-01-05 HISTORY — PX: UMBILICAL HERNIA REPAIR: SHX196

## 2015-01-06 DIAGNOSIS — T50995A Adverse effect of other drugs, medicaments and biological substances, initial encounter: Secondary | ICD-10-CM | POA: Diagnosis not present

## 2015-01-06 DIAGNOSIS — L508 Other urticaria: Secondary | ICD-10-CM | POA: Diagnosis not present

## 2015-04-14 DIAGNOSIS — R5383 Other fatigue: Secondary | ICD-10-CM | POA: Diagnosis not present

## 2015-05-19 DIAGNOSIS — G4733 Obstructive sleep apnea (adult) (pediatric): Secondary | ICD-10-CM | POA: Diagnosis not present

## 2015-05-26 DIAGNOSIS — R5383 Other fatigue: Secondary | ICD-10-CM | POA: Diagnosis not present

## 2015-05-26 DIAGNOSIS — G4733 Obstructive sleep apnea (adult) (pediatric): Secondary | ICD-10-CM | POA: Diagnosis not present

## 2015-05-26 DIAGNOSIS — E559 Vitamin D deficiency, unspecified: Secondary | ICD-10-CM | POA: Diagnosis not present

## 2015-11-25 DIAGNOSIS — M25562 Pain in left knee: Secondary | ICD-10-CM | POA: Diagnosis not present

## 2015-12-23 DIAGNOSIS — B351 Tinea unguium: Secondary | ICD-10-CM | POA: Diagnosis not present

## 2015-12-25 DIAGNOSIS — M1711 Unilateral primary osteoarthritis, right knee: Secondary | ICD-10-CM | POA: Diagnosis not present

## 2015-12-25 DIAGNOSIS — M1712 Unilateral primary osteoarthritis, left knee: Secondary | ICD-10-CM | POA: Diagnosis not present

## 2015-12-25 DIAGNOSIS — M25562 Pain in left knee: Secondary | ICD-10-CM | POA: Diagnosis not present

## 2015-12-25 DIAGNOSIS — M25561 Pain in right knee: Secondary | ICD-10-CM | POA: Diagnosis not present

## 2015-12-30 DIAGNOSIS — S90211A Contusion of right great toe with damage to nail, initial encounter: Secondary | ICD-10-CM | POA: Diagnosis not present

## 2016-01-14 DIAGNOSIS — J069 Acute upper respiratory infection, unspecified: Secondary | ICD-10-CM | POA: Diagnosis not present

## 2016-03-04 DIAGNOSIS — L600 Ingrowing nail: Secondary | ICD-10-CM | POA: Diagnosis not present

## 2016-03-04 DIAGNOSIS — S90221D Contusion of right lesser toe(s) with damage to nail, subsequent encounter: Secondary | ICD-10-CM | POA: Diagnosis not present

## 2016-03-18 DIAGNOSIS — J0100 Acute maxillary sinusitis, unspecified: Secondary | ICD-10-CM | POA: Diagnosis not present

## 2016-03-18 DIAGNOSIS — J209 Acute bronchitis, unspecified: Secondary | ICD-10-CM | POA: Diagnosis not present

## 2016-03-18 DIAGNOSIS — J111 Influenza due to unidentified influenza virus with other respiratory manifestations: Secondary | ICD-10-CM | POA: Diagnosis not present

## 2016-07-23 DIAGNOSIS — J0100 Acute maxillary sinusitis, unspecified: Secondary | ICD-10-CM | POA: Diagnosis not present

## 2016-10-25 DIAGNOSIS — Z23 Encounter for immunization: Secondary | ICD-10-CM | POA: Diagnosis not present

## 2017-04-05 DIAGNOSIS — J209 Acute bronchitis, unspecified: Secondary | ICD-10-CM | POA: Diagnosis not present

## 2017-04-05 DIAGNOSIS — J0100 Acute maxillary sinusitis, unspecified: Secondary | ICD-10-CM | POA: Diagnosis not present

## 2017-06-09 DIAGNOSIS — K5900 Constipation, unspecified: Secondary | ICD-10-CM | POA: Diagnosis not present

## 2017-06-15 DIAGNOSIS — Z1211 Encounter for screening for malignant neoplasm of colon: Secondary | ICD-10-CM | POA: Diagnosis not present

## 2017-06-15 DIAGNOSIS — K429 Umbilical hernia without obstruction or gangrene: Secondary | ICD-10-CM | POA: Diagnosis not present

## 2017-06-15 DIAGNOSIS — R12 Heartburn: Secondary | ICD-10-CM | POA: Diagnosis not present

## 2017-06-15 DIAGNOSIS — K219 Gastro-esophageal reflux disease without esophagitis: Secondary | ICD-10-CM | POA: Diagnosis not present

## 2017-07-04 DIAGNOSIS — K210 Gastro-esophageal reflux disease with esophagitis: Secondary | ICD-10-CM | POA: Diagnosis not present

## 2017-07-04 DIAGNOSIS — K449 Diaphragmatic hernia without obstruction or gangrene: Secondary | ICD-10-CM | POA: Diagnosis not present

## 2017-07-04 DIAGNOSIS — K209 Esophagitis, unspecified: Secondary | ICD-10-CM | POA: Diagnosis not present

## 2017-07-04 DIAGNOSIS — K648 Other hemorrhoids: Secondary | ICD-10-CM | POA: Diagnosis not present

## 2017-07-04 DIAGNOSIS — K5730 Diverticulosis of large intestine without perforation or abscess without bleeding: Secondary | ICD-10-CM | POA: Diagnosis not present

## 2017-07-04 DIAGNOSIS — K2270 Barrett's esophagus without dysplasia: Secondary | ICD-10-CM | POA: Diagnosis not present

## 2017-07-04 DIAGNOSIS — Z1211 Encounter for screening for malignant neoplasm of colon: Secondary | ICD-10-CM | POA: Diagnosis not present

## 2017-07-06 DIAGNOSIS — R1312 Dysphagia, oropharyngeal phase: Secondary | ICD-10-CM | POA: Diagnosis not present

## 2017-07-06 DIAGNOSIS — K1379 Other lesions of oral mucosa: Secondary | ICD-10-CM | POA: Diagnosis not present

## 2017-07-11 DIAGNOSIS — K429 Umbilical hernia without obstruction or gangrene: Secondary | ICD-10-CM | POA: Diagnosis not present

## 2017-07-11 DIAGNOSIS — K2270 Barrett's esophagus without dysplasia: Secondary | ICD-10-CM | POA: Diagnosis not present

## 2017-07-21 DIAGNOSIS — R9431 Abnormal electrocardiogram [ECG] [EKG]: Secondary | ICD-10-CM | POA: Diagnosis not present

## 2017-07-21 DIAGNOSIS — Z01818 Encounter for other preprocedural examination: Secondary | ICD-10-CM | POA: Diagnosis not present

## 2017-07-21 DIAGNOSIS — R001 Bradycardia, unspecified: Secondary | ICD-10-CM | POA: Diagnosis not present

## 2017-07-21 DIAGNOSIS — I440 Atrioventricular block, first degree: Secondary | ICD-10-CM | POA: Diagnosis not present

## 2017-07-28 DIAGNOSIS — K436 Other and unspecified ventral hernia with obstruction, without gangrene: Secondary | ICD-10-CM | POA: Diagnosis not present

## 2017-07-28 DIAGNOSIS — K420 Umbilical hernia with obstruction, without gangrene: Secondary | ICD-10-CM | POA: Diagnosis not present

## 2017-07-29 DIAGNOSIS — K420 Umbilical hernia with obstruction, without gangrene: Secondary | ICD-10-CM | POA: Diagnosis not present

## 2017-10-13 DIAGNOSIS — Z23 Encounter for immunization: Secondary | ICD-10-CM | POA: Diagnosis not present

## 2017-11-10 DIAGNOSIS — R350 Frequency of micturition: Secondary | ICD-10-CM | POA: Diagnosis not present

## 2017-11-10 DIAGNOSIS — R8290 Unspecified abnormal findings in urine: Secondary | ICD-10-CM | POA: Diagnosis not present

## 2017-11-17 DIAGNOSIS — N390 Urinary tract infection, site not specified: Secondary | ICD-10-CM | POA: Diagnosis not present

## 2017-12-07 DIAGNOSIS — H40013 Open angle with borderline findings, low risk, bilateral: Secondary | ICD-10-CM | POA: Diagnosis not present

## 2017-12-07 DIAGNOSIS — H52223 Regular astigmatism, bilateral: Secondary | ICD-10-CM | POA: Diagnosis not present

## 2017-12-07 DIAGNOSIS — H04123 Dry eye syndrome of bilateral lacrimal glands: Secondary | ICD-10-CM | POA: Diagnosis not present

## 2017-12-07 DIAGNOSIS — H3589 Other specified retinal disorders: Secondary | ICD-10-CM | POA: Diagnosis not present

## 2017-12-07 DIAGNOSIS — H25013 Cortical age-related cataract, bilateral: Secondary | ICD-10-CM | POA: Diagnosis not present

## 2017-12-07 DIAGNOSIS — H11153 Pinguecula, bilateral: Secondary | ICD-10-CM | POA: Diagnosis not present

## 2017-12-07 DIAGNOSIS — H18413 Arcus senilis, bilateral: Secondary | ICD-10-CM | POA: Diagnosis not present

## 2017-12-07 DIAGNOSIS — H524 Presbyopia: Secondary | ICD-10-CM | POA: Diagnosis not present

## 2017-12-07 DIAGNOSIS — H5213 Myopia, bilateral: Secondary | ICD-10-CM | POA: Diagnosis not present

## 2017-12-13 DIAGNOSIS — R350 Frequency of micturition: Secondary | ICD-10-CM | POA: Diagnosis not present

## 2017-12-13 DIAGNOSIS — R8290 Unspecified abnormal findings in urine: Secondary | ICD-10-CM | POA: Diagnosis not present

## 2017-12-13 DIAGNOSIS — Z87440 Personal history of urinary (tract) infections: Secondary | ICD-10-CM | POA: Diagnosis not present

## 2017-12-13 DIAGNOSIS — R82998 Other abnormal findings in urine: Secondary | ICD-10-CM | POA: Diagnosis not present

## 2017-12-26 DIAGNOSIS — H25811 Combined forms of age-related cataract, right eye: Secondary | ICD-10-CM | POA: Diagnosis not present

## 2017-12-26 DIAGNOSIS — H2511 Age-related nuclear cataract, right eye: Secondary | ICD-10-CM | POA: Diagnosis not present

## 2017-12-26 DIAGNOSIS — H25812 Combined forms of age-related cataract, left eye: Secondary | ICD-10-CM | POA: Diagnosis not present

## 2018-01-05 DIAGNOSIS — J111 Influenza due to unidentified influenza virus with other respiratory manifestations: Secondary | ICD-10-CM | POA: Diagnosis not present

## 2018-01-25 DIAGNOSIS — H2511 Age-related nuclear cataract, right eye: Secondary | ICD-10-CM | POA: Diagnosis not present

## 2018-01-25 DIAGNOSIS — H25811 Combined forms of age-related cataract, right eye: Secondary | ICD-10-CM | POA: Diagnosis not present

## 2018-02-06 DIAGNOSIS — H2512 Age-related nuclear cataract, left eye: Secondary | ICD-10-CM | POA: Diagnosis not present

## 2018-02-08 DIAGNOSIS — H2512 Age-related nuclear cataract, left eye: Secondary | ICD-10-CM | POA: Diagnosis not present

## 2018-02-08 DIAGNOSIS — H52222 Regular astigmatism, left eye: Secondary | ICD-10-CM | POA: Diagnosis not present

## 2018-02-08 DIAGNOSIS — H25812 Combined forms of age-related cataract, left eye: Secondary | ICD-10-CM | POA: Diagnosis not present

## 2018-02-08 DIAGNOSIS — H52212 Irregular astigmatism, left eye: Secondary | ICD-10-CM | POA: Diagnosis not present

## 2018-05-18 DIAGNOSIS — K2270 Barrett's esophagus without dysplasia: Secondary | ICD-10-CM | POA: Diagnosis not present

## 2018-05-18 DIAGNOSIS — K219 Gastro-esophageal reflux disease without esophagitis: Secondary | ICD-10-CM | POA: Diagnosis not present

## 2018-07-10 DIAGNOSIS — K210 Gastro-esophageal reflux disease with esophagitis: Secondary | ICD-10-CM | POA: Diagnosis not present

## 2018-07-10 DIAGNOSIS — K219 Gastro-esophageal reflux disease without esophagitis: Secondary | ICD-10-CM | POA: Diagnosis not present

## 2018-07-10 DIAGNOSIS — K200 Eosinophilic esophagitis: Secondary | ICD-10-CM | POA: Diagnosis not present

## 2018-07-10 DIAGNOSIS — K449 Diaphragmatic hernia without obstruction or gangrene: Secondary | ICD-10-CM | POA: Diagnosis not present

## 2018-07-13 DIAGNOSIS — R350 Frequency of micturition: Secondary | ICD-10-CM | POA: Diagnosis not present

## 2018-09-21 DIAGNOSIS — H16223 Keratoconjunctivitis sicca, not specified as Sjogren's, bilateral: Secondary | ICD-10-CM | POA: Diagnosis not present

## 2018-09-21 DIAGNOSIS — Z961 Presence of intraocular lens: Secondary | ICD-10-CM | POA: Diagnosis not present

## 2018-10-31 DIAGNOSIS — Z23 Encounter for immunization: Secondary | ICD-10-CM | POA: Diagnosis not present

## 2019-01-07 DIAGNOSIS — E871 Hypo-osmolality and hyponatremia: Secondary | ICD-10-CM | POA: Diagnosis not present

## 2019-01-07 DIAGNOSIS — R0902 Hypoxemia: Secondary | ICD-10-CM | POA: Diagnosis not present

## 2019-01-07 DIAGNOSIS — R0602 Shortness of breath: Secondary | ICD-10-CM | POA: Diagnosis not present

## 2019-01-07 DIAGNOSIS — Z888 Allergy status to other drugs, medicaments and biological substances status: Secondary | ICD-10-CM | POA: Diagnosis not present

## 2019-01-07 DIAGNOSIS — Z886 Allergy status to analgesic agent status: Secondary | ICD-10-CM | POA: Diagnosis not present

## 2019-01-07 DIAGNOSIS — E669 Obesity, unspecified: Secondary | ICD-10-CM | POA: Diagnosis present

## 2019-01-07 DIAGNOSIS — I10 Essential (primary) hypertension: Secondary | ICD-10-CM | POA: Diagnosis present

## 2019-01-07 DIAGNOSIS — Z881 Allergy status to other antibiotic agents status: Secondary | ICD-10-CM | POA: Diagnosis not present

## 2019-01-07 DIAGNOSIS — J1282 Pneumonia due to coronavirus disease 2019: Secondary | ICD-10-CM | POA: Diagnosis present

## 2019-01-07 DIAGNOSIS — Z6834 Body mass index (BMI) 34.0-34.9, adult: Secondary | ICD-10-CM | POA: Diagnosis not present

## 2019-01-07 DIAGNOSIS — G4733 Obstructive sleep apnea (adult) (pediatric): Secondary | ICD-10-CM | POA: Diagnosis present

## 2019-01-07 DIAGNOSIS — J159 Unspecified bacterial pneumonia: Secondary | ICD-10-CM | POA: Diagnosis not present

## 2019-01-07 DIAGNOSIS — I440 Atrioventricular block, first degree: Secondary | ICD-10-CM | POA: Diagnosis not present

## 2019-01-07 DIAGNOSIS — K219 Gastro-esophageal reflux disease without esophagitis: Secondary | ICD-10-CM | POA: Diagnosis present

## 2019-01-07 DIAGNOSIS — Z7982 Long term (current) use of aspirin: Secondary | ICD-10-CM | POA: Diagnosis not present

## 2019-01-07 DIAGNOSIS — J811 Chronic pulmonary edema: Secondary | ICD-10-CM | POA: Diagnosis present

## 2019-01-07 DIAGNOSIS — E222 Syndrome of inappropriate secretion of antidiuretic hormone: Secondary | ICD-10-CM | POA: Diagnosis not present

## 2019-01-07 DIAGNOSIS — K2270 Barrett's esophagus without dysplasia: Secondary | ICD-10-CM | POA: Diagnosis present

## 2019-01-07 DIAGNOSIS — U071 COVID-19: Secondary | ICD-10-CM | POA: Diagnosis not present

## 2019-01-07 DIAGNOSIS — Z79899 Other long term (current) drug therapy: Secondary | ICD-10-CM | POA: Diagnosis not present

## 2019-01-07 DIAGNOSIS — R7989 Other specified abnormal findings of blood chemistry: Secondary | ICD-10-CM | POA: Diagnosis not present

## 2019-01-07 DIAGNOSIS — I4510 Unspecified right bundle-branch block: Secondary | ICD-10-CM | POA: Diagnosis not present

## 2019-01-07 DIAGNOSIS — J9601 Acute respiratory failure with hypoxia: Secondary | ICD-10-CM | POA: Diagnosis not present

## 2019-02-05 DIAGNOSIS — J9601 Acute respiratory failure with hypoxia: Secondary | ICD-10-CM | POA: Diagnosis not present

## 2019-02-05 DIAGNOSIS — J690 Pneumonitis due to inhalation of food and vomit: Secondary | ICD-10-CM | POA: Diagnosis not present

## 2019-02-08 ENCOUNTER — Ambulatory Visit (INDEPENDENT_AMBULATORY_CARE_PROVIDER_SITE_OTHER): Payer: Medicare Other | Admitting: Internal Medicine

## 2019-02-08 ENCOUNTER — Encounter: Payer: Self-pay | Admitting: Internal Medicine

## 2019-02-08 ENCOUNTER — Other Ambulatory Visit: Payer: Self-pay

## 2019-02-08 DIAGNOSIS — J1282 Pneumonia due to coronavirus disease 2019: Secondary | ICD-10-CM | POA: Diagnosis not present

## 2019-02-08 DIAGNOSIS — U071 COVID-19: Secondary | ICD-10-CM | POA: Diagnosis not present

## 2019-02-08 DIAGNOSIS — G4733 Obstructive sleep apnea (adult) (pediatric): Secondary | ICD-10-CM | POA: Diagnosis not present

## 2019-02-08 NOTE — Progress Notes (Signed)
Charles Navarro, male    DOB: June 18, 1950,   MRN: [id number]   Brief patient profile:  23 yowm with obesity/GERD/osa/hbp but never smoked acute onset around 12/29/2018 fatigue, nasal congestion cough, not sob > admitted 01/07/19 - d/c 01/18/19 gradually improved p rx at [facility] d/c on 02 2lpm.  D/c summary: COVID-19 viral pneumonia/acute hypoxemic respiratory failure on 01/07/2019: Initially on 3 L [place] but got worse required up to 15 L, CTA did not show PE but shown widespread bilateral multifocal pneumonia with frank consolidation in spite of IV Rocephin and Zithromax and so upgraded to IV Zosyn on 1/9 for secondary bacterial hospital-acquired pneumonia, He was given couple days of IV lasix. He finished remdesivir on 1/7, continued Decadron through 1/13. Has history of OSA but could not tolerate CPAP at home, seen by Pul- recommended outpatient for sleep study.His condition gradually improved, O2 slowly weaned down to 2 liters on resting and 3 liters on ambulation, he is feeling much better, walking in the room, appetite is back to normal, weakness improved.   History of Present Illness  02/08/2019  Pulmonary/ 1st office eval/Amilah [facility]pan  Chief Complaint  Patient presents with  . Pulmonary Consult    Self referral- dx with covid 01/05/2019. He is having SOB since then. Had recent cxr done at [facility] and was advised had severe lung damage.   Dyspnea:  Improved to point of able to walk s 02 room to room 90s  (slow but steady improvement in ex tol Cough: none Sleep: on side / bed is flat no longer cpap and feels fine when wakes up(attributes to wt loss) SABA use: none   No obvious day to day or daytime variability or assoc excess/ purulent sputum or mucus plugs or hemoptysis or cp or chest tightness, subjective wheeze or overt sinus or hb symptoms.   Sleeping now  without nocturnal  or early am exacerbation  of respiratory  c/o's or need for noct saba. Also denies any obvious fluctuation of symptoms with weather  or environmental changes or other aggravating or alleviating factors except as outlined above   No unusual exposure hx or h/o childhood pna/ asthma or knowledge of premature birth.  Current Allergies, Complete Past Medical History, Past Surgical History, Family History, and Social History were reviewed in [employer] record.  ROS  The following are not active complaints unless bolded Hoarseness, sore throat, dysphagia, dental problems, itching, sneezing,  nasal congestion or discharge of excess mucus or purulent secretions, ear ache,   fever, chills, sweats, unintended wt loss or wt gain, classically pleuritic or exertional cp,  orthopnea pnd or arm/hand swelling  or leg swelling, presyncope, palpitations, abdominal pain, anorexia improving , nausea, vomiting, diarrhea  or change in bowel habits or change in bladder habits, change in stools or change in urine, dysuria, hematuria,  rash, arthralgias, visual complaints, headache, numbness, weakness or ataxia or problems with walking or coordination,  change in mood or  memory.           Past Medical History:  Diagnosis Date  . Bronchitis    hx of  . Depression   . Hypertension    takes meds daily  . Vertigo     Outpatient Medications Prior to Visit  Medication Sig Dispense Refill  . aspirin EC 81 MG tablet Take 81 mg by mouth daily.    Marland Kitchen b complex vitamins tablet Take 1 tablet by mouth daily.    . Cholecalciferol (VITAMIN D) 2000 UNITS CAPS  Take 1 capsule by mouth 2 (two) times daily.    . Cyanocobalamin (B-12 PO) Take 1 tablet by mouth daily.    . Multiple Vitamin (MULTIVITAMIN WITH MINERALS) TABS Take 1 tablet by mouth daily.    . Omega-3 Fatty Acids (FISH OIL) 1000 MG CAPS Take 1,000 mg by mouth 2 (two) times daily.    . pantoprazole (PROTONIX) 40 MG tablet Take 40 mg by mouth daily.    . cyclobenzaprine (FLEXERIL) 10 MG tablet Take 1 tablet (10 mg total) by mouth 3 (three) times daily as needed for muscle  spasms. 80 tablet 1  . famotidine (PEPCID AC) 10 MG chewable tablet Chew 10 mg by mouth 2 (two) times daily.    Marland Kitchen gabapentin (NEURONTIN) 300 MG capsule Take 300 mg by mouth daily.    Marland Kitchen oxyCODONE-acetaminophen (PERCOCET/ROXICET) 5-325 MG per tablet Take 1-2 tablets by mouth every 4 (four) hours as needed. For pain    . promethazine (PHENERGAN) 25 MG tablet Take 25 mg by mouth every 6 (six) hours as needed.        Objective:     BP 132/80 (BP Location: Left Arm, Cuff Size: Normal)   Pulse 71   Temp (!) 97.3 F (36.3 C) (Temporal)   Ht 6\' 6"  (1.981 m)   Wt 278 lb (126.1 kg)   SpO2 96% Comment: on RA  BMI 32.13 kg/m   SpO2: 96 %(on RA)   Obese pleasant amb wm nad   HEENT : pt wearing mask not removed for exam due to covid -19 concerns.    NECK :  without JVD/Nodes/TM/ nl carotid upstrokes bilaterally   LUNGS: no acc muscle use,  Nl contour chest with sltly coarse bs  bilaterally without cough on insp or exp maneuvers   CV:  RRR  no s3 or murmur or increase in P2, and no edema   ABD:  Quite obese but soft and nontender with nl inspiratory excursion in the supine position. No bruits or organomegaly appreciated, bowel sounds nl  MS:  Nl gait/ ext warm without deformities, calf tenderness, cyanosis or clubbing No obvious joint restrictions   SKIN: warm and dry without lesions    NEURO:  alert, approp, nl sensorium with  no motor or cerebellar deficits apparent.       I personally reviewed images and agree with radiology impression as follows:   Chest CTa 01/13/19 1. No demonstrable pulmonary embolus. No thoracic aortic aneurysm or dissection. There are foci of coronary artery calcification.  2. Widespread airspace opacity bilaterally consistent with multifocal pneumonia. Multiple areas of frank consolidation are noted as well as patchy areas of ground-glass type opacity. No pleural effusion.  3. No evident adenopathy.  4. Focal hiatal hernia.  5. Chronic  left-sided pericardial calcification, likely due to previous inflammation/pericarditis. There is no associated pericardial effusion or pericardial thickening. No findings suggesting active pericarditis at this time.      Assessment   No problem-specific Assessment & Plan notes found for this encounter.     03/13/19, MD 02/08/2019

## 2019-02-08 NOTE — Assessment & Plan Note (Addendum)
Stopped cpap at admit 01/07/19 and remained off CPAP after discharge having lost significant weight due to Covid  No indication at this point for CPAP as he has no hypersomnolence and feels fine when he wakes up with no grogginess or headache.  I applauded him on his weight loss and suggested he should continue this if he wants to remain free of significant sleep apnea or other complications of obesity.   Total time devoted to counseling  > 50 % of initial 45 min office visit:  reviewed case with pt/ charting/ directly observed portions of ambulatory 02 saturation study/ discussion of options/alternatives/ personally creating written customized instructions  in presence of pt  then going over those specific  Instructions directly with the pt including how to use all of the meds but in particular covering each new medication in detail and the difference between the maintenance= "automatic" meds and the prns using an action plan format for the latter (If this problem/symptom => do that organization reading Left to right).  Please see AVS from this visit for a full list of these instructions which I personally wrote for this pt and  are unique to this visit.

## 2019-02-08 NOTE — Patient Instructions (Signed)
Make sure you check your oxygen saturations at highest level of activity to be sure it stays over 90% and let me know if you are trending down.   Please schedule a follow up office visit in 4 weeks, sooner if needed  with all medications /inhalers/ solutions in hand so we can verify exactly what you are taking. This includes all medications from all doctors and over the counters. - need cxr on return

## 2019-02-08 NOTE — Assessment & Plan Note (Signed)
Onset of symptoms 12/29/2018  -admitted 01/07/19 - d/c 01/18/19 gradually improved p rx at [facility] d/c on 02 2lpm p rx dex/ remdesovir/ no vent - 02/08/2019   Walked RA x one lap =  approx 250 ft -@ nl pace stopped due to sob with sats 92%  I assured him he is on an excellent trajectory with the main concern being late onset hypercoagulability related to the Covid and also his morbid obesity and therefore applauded his efforts to mobilize fully and gave him the usual symptoms to look for to indicate that he was not continuing to improve, namely the monitoring of his O2 saturations with activity.  All that is required here is a 4-week follow-up for a chest x-ray and another walking saturation and he will call me in the meantime if there is any deterioration at all in terms of his respiratory symptoms.

## 2019-03-08 ENCOUNTER — Other Ambulatory Visit: Payer: Self-pay | Admitting: Internal Medicine

## 2019-03-08 DIAGNOSIS — U071 COVID-19: Secondary | ICD-10-CM

## 2019-03-09 ENCOUNTER — Ambulatory Visit (INDEPENDENT_AMBULATORY_CARE_PROVIDER_SITE_OTHER): Payer: Medicare Other | Admitting: Internal Medicine

## 2019-03-09 ENCOUNTER — Ambulatory Visit (INDEPENDENT_AMBULATORY_CARE_PROVIDER_SITE_OTHER): Payer: Medicare Other

## 2019-03-09 ENCOUNTER — Encounter: Payer: Self-pay | Admitting: Internal Medicine

## 2019-03-09 ENCOUNTER — Other Ambulatory Visit: Payer: Self-pay

## 2019-03-09 DIAGNOSIS — J181 Lobar pneumonia, unspecified organism: Secondary | ICD-10-CM | POA: Diagnosis not present

## 2019-03-09 DIAGNOSIS — U071 COVID-19: Secondary | ICD-10-CM

## 2019-03-09 DIAGNOSIS — J1282 Pneumonia due to coronavirus disease 2019: Secondary | ICD-10-CM

## 2019-03-09 DIAGNOSIS — Z8619 Personal history of other infectious and parasitic diseases: Secondary | ICD-10-CM | POA: Diagnosis not present

## 2019-03-09 NOTE — Assessment & Plan Note (Signed)
Onset of symptoms 12/29/2018  -admitted 01/07/19 - d/c 01/18/19 gradually improved p rx at [facility] d/c on 02 2lpm p rx dex/ remdesovir/ no vent - 02/08/2019   Walked RA x one lap =  approx 250 ft -@ nl pace stopped due to sob with sats 92% - 03/09/2019 reported walking sats fine but extensive post ALI changes on cxr so rec cxr/ pfts in 3 m  His "wheezing" after supper that resolves s rx is likely gerd related so rec add pepcid after supper and continue to monitor ex sats   F/u in 3 m with final cxr/ pfts - call sooner if noting any decline in ex tol or decreasing 02 sats.           Each maintenance medication was reviewed in detail including emphasizing most importantly the difference between maintenance and prns and under what circumstances the prns are to be triggered using an action plan format where appropriate.  Total time for H and P, chart review, counseling,  and generating customized AVS unique to this office visit / charting = 30 min

## 2019-03-09 NOTE — Patient Instructions (Addendum)
Try Protonix 40 mg (pantoprazole)  Take 30-60 min before first meal of the day and add  Pepcid ac (famotidine) 20 mg one @  Supper  until cough/wheeze are  completely gone for at least a week without the need for cough suppression then ok to leave it off.  GERD (REFLUX)  is an extremely common cause of respiratory symptoms just like yours , many times with no obvious heartburn at all.    It can be treated with medication, but also with lifestyle changes including elevation of the head of your bed (ideally with 6 -8inch blocks under the headboard of your bed),  Smoking cessation, avoidance of late meals, excessive alcohol, and avoid fatty foods, chocolate, peppermint, colas, red wine, and acidic juices such as orange juice.  NO MINT OR MENTHOL PRODUCTS SO NO COUGH DROPS  USE SUGARLESS CANDY INSTEAD (Jolley ranchers or Stover's or Life Savers) or even ice chips will also do - the key is to swallow to prevent all throat clearing. NO OIL BASED VITAMINS - use powdered substitutes.  Avoid fish oil when coughing.    If not better to your satisfaction after a month, call for full PFTs to be scheduled and I will see you the same day.   Please remember to go to the  x-ray department  for your tests - we will call you with the results when they are available    Pulmonary follow up is as needed  Add:  F/u ov with cxr / pfts in 3 months

## 2019-03-09 NOTE — Progress Notes (Signed)
Charles Navarro, male    DOB: Feb 05, 1950,   MRN: [id number]   Brief patient profile:  81 yowm with obesity/GERD/osa/hbp but never smoked acute onset around 12/29/2018 fatigue, nasal congestion cough, not sob > admitted 01/07/19 - d/c 01/18/19 gradually improved p rx at [facility] d/c on 02 2lpm.  D/c summary: COVID-19 viral pneumonia/acute hypoxemic respiratory failure on 01/07/2019: Initially on 3 L [place] but got worse required up to 15 L, CTA did not show PE but shown widespread bilateral multifocal pneumonia with frank consolidation in spite of IV Rocephin and Zithromax and so upgraded to IV Zosyn on 1/9 for secondary bacterial hospital-acquired pneumonia, He was given couple days of IV lasix. He finished remdesivir on 1/7, continued Decadron through 1/13. Has history of OSA but could not tolerate CPAP at home, seen by Pul- recommended outpatient for sleep study.His condition gradually improved, O2 slowly weaned down to 2 liters on resting and 3 liters on ambulation, he is feeling much better, walking in the room, appetite is back to normal, weakness improved.     History of Present Illness  02/08/2019  Pulmonary/ 1st office eval/Neave Lenger  Chief Complaint  Patient presents with  . Pulmonary Consult    Self referral- dx with covid 01/05/2019. He is having SOB since then. Had recent cxr done at [facility] and was advised had severe lung damage.   Dyspnea:  Improved to point of able to walk s 02 room to room 90s  (slow but steady improvement in ex tol Cough: none Sleep: on side / bed is flat no longer cpap and feels fine when wakes up(attributes to wt loss) SABA use: none  rec Make sure you check your oxygen saturations at highest level of activity to be sure it stays over 90% and let me know if you are trending  Down    03/09/2019  f/u ov/Kelden Lavallee re: s/p covid 19 pna onset of symptoms 12/29/18 Chief Complaint  Patient presents with  . Follow-up    Pneumonia due to COVID-19 virus - Still having issue with wheezing    Dyspnea:  Walking 10 -15 minutes outdoors and sats are fine RA Cough: at rest assoc with sense of wheeze /  Sitting p supper, resolved p 15 m s rx  Sleeping: no cpap/ sleeping  SABA use: none  02: none    No obvious day to day or daytime variability or assoc excess/ purulent sputum or mucus plugs or hemoptysis or cp or chest tightness,   or overt sinus or hb symptoms.   Sleeping as abovewithout nocturnal  or early am exacerbation  of respiratory  c/o's or need for noct saba. Also denies any obvious fluctuation of symptoms with weather or environmental changes or other aggravating or alleviating factors except as outlined above   No unusual exposure hx or h/o childhood pna/ asthma or knowledge of premature birth.  Current Allergies, Complete Past Medical History, Past Surgical History, Family History, and Social History were reviewed in [employer] record.  ROS  The following are not active complaints unless bolded Hoarseness, sore throat, dysphagia, dental problems, itching, sneezing,  nasal congestion or discharge of excess mucus or purulent secretions, ear ache,   fever, chills, sweats, unintended wt loss or wt gain, classically pleuritic or exertional cp,  orthopnea pnd or arm/hand swelling  or leg swelling, presyncope, palpitations, abdominal pain, anorexia, nausea, vomiting, diarrhea  or change in bowel habits or change in bladder habits, change in stools or change in urine, dysuria,  hematuria,  rash, arthralgias, visual complaints, headache, numbness, weakness or ataxia or problems with walking or coordination,  change in mood or  memory.        Current Meds  Medication Sig  . aspirin EC 81 MG tablet Take 81 mg by mouth daily.  Marland Kitchen b complex vitamins tablet Take 1 tablet by mouth daily.  . Cholecalciferol (VITAMIN D) 2000 UNITS CAPS Take 1 capsule by mouth 2 (two) times daily.  . Cyanocobalamin (B-12 PO) Take 1 tablet by mouth daily.  . Multiple Vitamin  (MULTIVITAMIN WITH MINERALS) TABS Take 1 tablet by mouth daily.  . Omega-3 Fatty Acids (FISH OIL) 1000 MG CAPS Take 1,000 mg by mouth 2 (two) times daily.  . pantoprazole (PROTONIX) 40 MG tablet Take 40 mg by mouth daily.                           Objective:    Somber amb wm min voice fatigue  Wt Readings from Last 3 Encounters:  03/09/19 283 lb 12.8 oz (128.7 kg)  02/08/19 278 lb (126.1 kg)  05/23/12 263 lb 12 oz (119.6 kg)     Vital signs reviewed - Note on arrival 02 sats  98% on RA      HEENT : pt wearing mask not removed for exam due to covid -19 concerns.    NECK :  without JVD/Nodes/TM/ nl carotid upstrokes bilaterally   LUNGS: no acc muscle use,  Nl contour chest which is clear to A and P bilaterally without cough on insp or exp maneuvers   CV:  RRR  no s3 or murmur or increase in P2, and no edema   ABD:  soft and nontender with nl inspiratory excursion in the supine position. No bruits or organomegaly appreciated, bowel sounds nl  MS:  Nl gait/ ext warm without deformities, calf tenderness, cyanosis or clubbing No obvious joint restrictions   SKIN: warm and dry without lesions    NEURO:  alert, approp, nl sensorium with  no motor or cerebellar deficits apparent.    CXR PA and Lateral:   03/09/2019 :    I personally reviewed images and    impression as follows:   No comparisons but c/w slow recovery from ALI related to COVID 19      vs CT chest report from 01/13/19: Widespread airspace opacity bilaterally consistent with multifocal pneumonia. Multiple areas of frank consolidation are noted as well as patchy areas of ground-glass type opacity. No pleural effusion.    Assessment

## 2019-03-13 ENCOUNTER — Telehealth: Payer: Self-pay | Admitting: Internal Medicine

## 2019-03-13 DIAGNOSIS — J1282 Pneumonia due to coronavirus disease 2019: Secondary | ICD-10-CM

## 2019-03-13 DIAGNOSIS — U071 COVID-19: Secondary | ICD-10-CM

## 2019-03-13 NOTE — Telephone Encounter (Signed)
Nyoka Cowden, MD  03/10/2019 5:58 AM EST    Call pt: Reviewed cxr and improved but there are still lots of post covid changes that should improve if not completely resolve by 3 months so rec cxr and pfts in 3 months to close the loop    Called and spoke with pt letting him know the results of the cxr and pt verablized understanding. Pt did not have a f/u nor a PFT scheduled so I have scheduled pt a f/u with MW with PFT prior as well as covid test. Also stated in appt notes for MW's appt that pt will need cxr prior.  Routing this to myself so I can be able to place pt's appt info in mail for him.

## 2019-03-13 NOTE — Progress Notes (Signed)
LMTCB

## 2019-03-14 NOTE — Telephone Encounter (Signed)
Pt's appt info has been placed in the mail for pt. Nothing further needed.

## 2019-06-09 ENCOUNTER — Other Ambulatory Visit (HOSPITAL_COMMUNITY)
Admission: RE | Admit: 2019-06-09 | Discharge: 2019-06-09 | Disposition: A | Discharge status: Home / Self Care | Payer: Medicare Other | Source: Ambulatory Visit | Attending: Internal Medicine | Admitting: Internal Medicine

## 2019-06-09 DIAGNOSIS — Z01812 Encounter for preprocedural laboratory examination: Secondary | ICD-10-CM | POA: Insufficient documentation

## 2019-06-09 DIAGNOSIS — Z20822 Contact with and (suspected) exposure to covid-19: Secondary | ICD-10-CM | POA: Insufficient documentation

## 2019-06-09 LAB — SARS CORONAVIRUS 2 (TAT 6-24 HRS): SARS Coronavirus 2: NEGATIVE

## 2019-06-12 ENCOUNTER — Other Ambulatory Visit: Payer: Self-pay | Admitting: Internal Medicine

## 2019-06-12 DIAGNOSIS — U071 COVID-19: Secondary | ICD-10-CM

## 2019-06-13 ENCOUNTER — Ambulatory Visit (INDEPENDENT_AMBULATORY_CARE_PROVIDER_SITE_OTHER): Payer: Medicare Other | Admitting: Internal Medicine

## 2019-06-13 ENCOUNTER — Other Ambulatory Visit: Payer: Self-pay

## 2019-06-13 ENCOUNTER — Encounter: Payer: Self-pay | Admitting: Internal Medicine

## 2019-06-13 ENCOUNTER — Ambulatory Visit (INDEPENDENT_AMBULATORY_CARE_PROVIDER_SITE_OTHER): Payer: Medicare Other

## 2019-06-13 DIAGNOSIS — J1282 Pneumonia due to coronavirus disease 2019: Secondary | ICD-10-CM

## 2019-06-13 DIAGNOSIS — U071 COVID-19: Secondary | ICD-10-CM

## 2019-06-13 DIAGNOSIS — R918 Other nonspecific abnormal finding of lung field: Secondary | ICD-10-CM | POA: Diagnosis not present

## 2019-06-13 LAB — PULMONARY FUNCTION TEST
FEF 25-75 Post: 4.12 L/sec
FEF 25-75 Pre: 3.1 L/sec
FEF2575-%Change-Post: 32 %
FEF2575-%Pred-Post: 122 %
FEF2575-%Pred-Pre: 92 %
FEV1-%Change-Post: 6 %
FEV1-%Pred-Post: 90 %
FEV1-%Pred-Pre: 85 %
FEV1-Post: 4 L
FEV1-Pre: 3.76 L
FEV1FVC-%Change-Post: 5 %
FEV1FVC-%Pred-Pre: 103 %
FEV6-%Change-Post: 1 %
FEV6-%Pred-Post: 87 %
FEV6-%Pred-Pre: 86 %
FEV6-Post: 4.95 L
FEV6-Pre: 4.87 L
FEV6FVC-%Change-Post: 0 %
FEV6FVC-%Pred-Post: 104 %
FEV6FVC-%Pred-Pre: 104 %
FVC-%Change-Post: 0 %
FVC-%Pred-Post: 83 %
FVC-%Pred-Pre: 82 %
FVC-Post: 4.95 L
FVC-Pre: 4.91 L
Post FEV1/FVC ratio: 81 %
Post FEV6/FVC ratio: 100 %
Pre FEV1/FVC ratio: 77 %
Pre FEV6/FVC Ratio: 99 %
RV % pred: 92 %
RV: 2.63 L
TLC % pred: 83 %
TLC: 7.21 L

## 2019-06-13 NOTE — Assessment & Plan Note (Addendum)
Onset of symptoms 12/29/2018  -admitted 01/07/19 - d/c 01/18/19 gradually improved p rx at [facility] d/c on 02 2lpm p rx dex/ remdesovir/ no vent - 02/08/2019   Walked RA x one lap =  approx 250 ft -@ nl pace stopped due to sob with sats 92% - 03/09/2019 reported walking sats fine but extensive post ALI changes on cxr > marked improvement 06/13/2019  - PFTs 06/13/2019 nl x for ERV 30% c/w obesty effects   Despite above findings he feels completely back to baseline so no further f/u is needed         Each maintenance medication was reviewed in detail including emphasizing most importantly the difference between maintenance and prns and under what circumstances the prns are to be triggered using an action plan format where appropriate.  Total time for H and P, chart review, counseling,  and generating customized AVS unique to this final summary f/u  office visit / charting = 20 min

## 2019-06-13 NOTE — Progress Notes (Signed)
Charles Navarro, male    DOB: [birth year],   MRN: [id number]   Brief patient profile:  35 yowm with obesity/GERD/osa/hbp but never smoked acute onset around 12/29/2018 fatigue, nasal congestion cough, not sob > admitted 01/07/19 - d/c 01/18/19 gradually improved p rx at [facility] d/c on 02 2lpm.  D/c summary: COVID-19 viral pneumonia/acute hypoxemic respiratory failure on 01/07/2019: Initially on 3 L [place] but got worse required up to 15 L, CTA did not show PE but shown widespread bilateral multifocal pneumonia with frank consolidation in spite of IV Rocephin and Zithromax and so upgraded to IV Zosyn on 1/9 for secondary bacterial hospital-acquired pneumonia, He was given couple days of IV lasix. He finished remdesivir on 1/7, continued Decadron through 1/13. Has history of OSA but could not tolerate CPAP at home, seen by Pul- recommended outpatient for sleep study.His condition gradually improved, O2 slowly weaned down to 2 liters on resting and 3 liters on ambulation, he is feeling much better, walking in the room, appetite is back to normal, weakness improved.     History of Present Illness  02/08/2019  Pulmonary/ 1st office eval/Terre Hanneman  Chief Complaint  Patient presents with  . Pulmonary Consult    Self referral- dx with covid 01/05/2019. He is having SOB since then. Had recent cxr done at [facility] and was advised had severe lung damage.   Dyspnea:  Improved to point of able to walk s 02 room to room 90s  (slow but steady improvement in ex tol Cough: none Sleep: on side / bed is flat no longer cpap and feels fine when wakes up(attributes to wt loss) SABA use: none  rec Make sure you check your oxygen saturations at highest level of activity to be sure it stays over 90% and let me know if you are trending  Down    03/09/2019  f/u ov/Manali Mcelmurry re: s/p covid 19 pna onset of symptoms 12/29/18 Chief Complaint  Patient presents with  . Follow-up    Pneumonia due to COVID-19 virus - Still having issue with wheezing    Dyspnea:  Walking 10 -15 minutes outdoors and sats are fine RA Cough: at rest assoc with sense of wheeze /  Sitting p supper, resolved p 15 m s rx  Sleeping: no cpap/ sleeping  SABA use: none  02: none  rec Try Protonix 40 mg (pantoprazole)  Take 30-60 min before first meal of the day and add  Pepcid ac (famotidine) 20 mg one @  Supper   GERD  diet. If not better to your satisfaction after a month, call for full PFTs to be scheduled and I will see you the same day    06/13/2019  f/u ov/Orah Sonnen re: final post covid ov  Chief Complaint  Patient presents with  . Follow-up  Dyspnea:  Walking 30 min some uphills and fine does not check sats any more as always trending up-   Esp since started wt loss program he's overall much better with ex  Cough: no Sleeping: no problem flat SABA use: none 02: none    No obvious day to day or daytime variability or assoc excess/ purulent sputum or mucus plugs or hemoptysis or cp or chest tightness, subjective wheeze or overt sinus or hb symptoms.   Sleeping as above without nocturnal  or early am exacerbation  of respiratory  c/o's or need for noct saba. Also denies any obvious fluctuation of symptoms with weather or environmental changes or other aggravating or alleviating factors except  as outlined above   No unusual exposure hx or h/o childhood pna/ asthma or knowledge of premature birth.  Current Allergies, Complete Past Medical History, Past Surgical History, Family History, and Social History were reviewed in [employer] record.  ROS  The following are not active complaints unless bolded Hoarseness, sore throat, dysphagia, dental problems, itching, sneezing,  nasal congestion or discharge of excess mucus or purulent secretions, ear ache,   fever, chills, sweats, unintended wt loss or wt gain, classically pleuritic or exertional cp,  orthopnea pnd or arm/hand swelling  or leg swelling, presyncope, palpitations, abdominal pain,  anorexia, nausea, vomiting, diarrhea  or change in bowel habits or change in bladder habits, change in stools or change in urine, dysuria, hematuria,  rash, arthralgias, visual complaints, headache, numbness, weakness or ataxia or problems with walking or coordination,  change in mood or  memory.        Current Meds  Medication Sig  . aspirin EC 81 MG tablet Take 81 mg by mouth daily.  Marland Kitchen b complex vitamins tablet Take 1 tablet by mouth daily.  . chlorpheniramine (CHLOR-TRIMETON) 4 MG tablet Take 4 mg by mouth 2 (two) times daily as needed for allergies.  . Cholecalciferol (VITAMIN D) 2000 UNITS CAPS Take 1 capsule by mouth 2 (two) times daily.  . Cyanocobalamin (B-12 PO) Take 1 tablet by mouth daily.  . diclofenac (VOLTAREN) 50 MG EC tablet Take 50 mg by mouth 2 (two) times daily as needed.  . diphenhydrAMINE HCl, Sleep, 25 MG CAPS Take 1 tablet by mouth daily as needed.  . meclizine (ANTIVERT) 25 MG tablet Take 25 mg by mouth 3 (three) times daily as needed for dizziness. 1 - 3 tablets as needed for dizziness  . Melatonin 3-10 MG TABS Take 3 mg by mouth.  . Multiple Vitamin (MULTIVITAMIN WITH MINERALS) TABS Take 1 tablet by mouth daily.  . Omega-3 Fatty Acids (FISH OIL) 1000 MG CAPS Take 1,000 mg by mouth 2 (two) times daily.  . pantoprazole (PROTONIX) 40 MG tablet Take 40 mg by mouth daily.  . prochlorperazine (COMPAZINE) 5 MG tablet Take 5 mg by mouth 2 (two) times daily as needed for nausea or vomiting.  . [DISCONTINUED] zolpidem (AMBIEN) 10 MG tablet Take 10 mg by mouth at bedtime as needed for sleep.                Objective:    Somber wm nad   06/13/2019          292   03/09/19 283 lb 12.8 oz (128.7 kg)  02/08/19 278 lb (126.1 kg)  05/23/12 263 lb 12 oz (119.6 kg)     Vital signs reviewed  06/13/2019  - Note at rest 02 sats  98% on RA    HEENT : pt wearing mask not removed for exam due to covid -19 concerns.    NECK :  without JVD/Nodes/TM/ nl carotid upstrokes  bilaterally   LUNGS: no acc muscle use,  Nl contour chest which is clear to A and P bilaterally without cough on insp or exp maneuvers   CV:  RRR  no s3 or murmur or increase in P2, and no edema   ABD: obese  soft and nontender with nl inspiratory excursion in the supine position. No bruits or organomegaly appreciated, bowel sounds nl  MS:  Nl gait/ ext warm without deformities, calf tenderness, cyanosis or clubbing No obvious joint restrictions   SKIN: warm and dry without lesions  NEURO:  alert, approp, nl sensorium with  no motor or cerebellar deficits apparent.          CXR PA and Lateral:   06/13/2019 :    I personally reviewed images and  impression as follows: Marked serial improvement in parenchymal changes         Assessment

## 2019-06-13 NOTE — Patient Instructions (Signed)
Please remember to go to the  x-ray department  for your tests - we will call you with the results when they are available    Pulmonary follow up is as needed 

## 2019-06-13 NOTE — Progress Notes (Signed)
Spiro/pre/post and Pleth performed today.

## 2019-06-26 ENCOUNTER — Telehealth: Payer: Self-pay | Admitting: Internal Medicine

## 2019-06-26 NOTE — Telephone Encounter (Signed)
Discussed ali/ pf different rules vs IPF and as long as steady improvement in ex tol, no desats no need for additional studies but offered ov in 6 m "for the latest" on post covid data at that point but he declined and will talk it over with his new PCP in context of yearly cpx

## 2019-06-26 NOTE — Telephone Encounter (Signed)
Forwarding to MW per his request

## 2019-08-29 DIAGNOSIS — Z20828 Contact with and (suspected) exposure to other viral communicable diseases: Secondary | ICD-10-CM | POA: Diagnosis not present

## 2019-09-24 DIAGNOSIS — Z961 Presence of intraocular lens: Secondary | ICD-10-CM | POA: Diagnosis not present

## 2019-09-24 DIAGNOSIS — H16223 Keratoconjunctivitis sicca, not specified as Sjogren's, bilateral: Secondary | ICD-10-CM | POA: Diagnosis not present

## 2020-09-29 DIAGNOSIS — H16223 Keratoconjunctivitis sicca, not specified as Sjogren's, bilateral: Secondary | ICD-10-CM | POA: Diagnosis not present

## 2020-09-29 DIAGNOSIS — Z961 Presence of intraocular lens: Secondary | ICD-10-CM | POA: Diagnosis not present

## 2020-11-24 DIAGNOSIS — R21 Rash and other nonspecific skin eruption: Secondary | ICD-10-CM | POA: Diagnosis not present

## 2020-12-08 DIAGNOSIS — J209 Acute bronchitis, unspecified: Secondary | ICD-10-CM | POA: Diagnosis not present

## 2020-12-08 DIAGNOSIS — R051 Acute cough: Secondary | ICD-10-CM | POA: Diagnosis not present

## 2020-12-08 DIAGNOSIS — M791 Myalgia, unspecified site: Secondary | ICD-10-CM | POA: Diagnosis not present

## 2021-01-05 DIAGNOSIS — J324 Chronic pansinusitis: Secondary | ICD-10-CM | POA: Diagnosis not present

## 2021-01-05 DIAGNOSIS — R051 Acute cough: Secondary | ICD-10-CM | POA: Diagnosis not present

## 2021-01-05 DIAGNOSIS — R5383 Other fatigue: Secondary | ICD-10-CM | POA: Diagnosis not present

## 2021-02-16 DIAGNOSIS — J324 Chronic pansinusitis: Secondary | ICD-10-CM | POA: Diagnosis not present

## 2021-05-11 DIAGNOSIS — L501 Idiopathic urticaria: Secondary | ICD-10-CM | POA: Diagnosis not present

## 2021-05-11 DIAGNOSIS — L299 Pruritus, unspecified: Secondary | ICD-10-CM | POA: Diagnosis not present

## 2021-05-25 DIAGNOSIS — L501 Idiopathic urticaria: Secondary | ICD-10-CM | POA: Diagnosis not present

## 2021-07-06 DIAGNOSIS — R1084 Generalized abdominal pain: Secondary | ICD-10-CM | POA: Diagnosis not present

## 2021-09-30 DIAGNOSIS — Z961 Presence of intraocular lens: Secondary | ICD-10-CM | POA: Diagnosis not present

## 2021-09-30 DIAGNOSIS — H16223 Keratoconjunctivitis sicca, not specified as Sjogren's, bilateral: Secondary | ICD-10-CM | POA: Diagnosis not present

## 2021-12-07 DIAGNOSIS — M5432 Sciatica, left side: Secondary | ICD-10-CM | POA: Diagnosis not present

## 2022-02-08 DIAGNOSIS — R051 Acute cough: Secondary | ICD-10-CM | POA: Diagnosis not present

## 2022-02-08 DIAGNOSIS — R0981 Nasal congestion: Secondary | ICD-10-CM | POA: Diagnosis not present

## 2022-02-08 DIAGNOSIS — J209 Acute bronchitis, unspecified: Secondary | ICD-10-CM | POA: Diagnosis not present

## 2022-02-12 DIAGNOSIS — R059 Cough, unspecified: Secondary | ICD-10-CM | POA: Diagnosis not present

## 2022-02-12 DIAGNOSIS — J209 Acute bronchitis, unspecified: Secondary | ICD-10-CM | POA: Diagnosis not present

## 2022-02-25 ENCOUNTER — Other Ambulatory Visit: Payer: Self-pay | Admitting: Neurosurgery

## 2022-02-25 DIAGNOSIS — M5440 Lumbago with sciatica, unspecified side: Secondary | ICD-10-CM

## 2022-03-02 ENCOUNTER — Ambulatory Visit (INDEPENDENT_AMBULATORY_CARE_PROVIDER_SITE_OTHER): Payer: Medicare Other | Admitting: Family

## 2022-03-02 ENCOUNTER — Encounter: Payer: Self-pay | Admitting: Family

## 2022-03-02 VITALS — BP 140/80 | HR 59 | Resp 18 | Ht 78.0 in | Wt 298.0 lb

## 2022-03-02 DIAGNOSIS — R9389 Abnormal findings on diagnostic imaging of other specified body structures: Secondary | ICD-10-CM | POA: Diagnosis not present

## 2022-03-02 NOTE — Progress Notes (Signed)
Charles Navarro is a 72 y.o. male with the following history as recorded in [employer]:  Patient Active Problem List   Diagnosis Date Noted   Pneumonia due to COVID-19 virus 02/08/2019   OSA (obstructive sleep apnea) 02/08/2019   Boil 03/06/2012   Discitis of lumbar region 01/31/2012   Diarrhea 01/31/2012   Nausea 01/31/2012   Acid reflux 01/31/2012   Weight loss, abnormal 01/31/2012    Current Outpatient Medications  Medication Sig Dispense Refill   b complex vitamins tablet Take 1 tablet by mouth daily.     chlorpheniramine (CHLOR-TRIMETON) 4 MG tablet Take 4 mg by mouth 2 (two) times daily as needed for allergies.     Cholecalciferol (VITAMIN D) 2000 UNITS CAPS Take 1 capsule by mouth 2 (two) times daily.     Cyanocobalamin (B-12 PO) Take 1 tablet by mouth daily.     diclofenac (VOLTAREN) 50 MG EC tablet Take 50 mg by mouth 2 (two) times daily as needed.     diphenhydrAMINE HCl, Sleep, 25 MG CAPS Take 1 tablet by mouth daily as needed.     lisinopril-hydrochlorothiazide (ZESTORETIC) 10-12.5 MG tablet Take 1 tablet by mouth daily.     meclizine (ANTIVERT) 25 MG tablet Take 25 mg by mouth 3 (three) times daily as needed for dizziness. 1 - 3 tablets as needed for dizziness     Melatonin 3-10 MG TABS Take 3 mg by mouth.     Multiple Vitamin (MULTIVITAMIN WITH MINERALS) TABS Take 1 tablet by mouth daily.     Omega-3 Fatty Acids (FISH OIL) 1000 MG CAPS Take 1,000 mg by mouth 2 (two) times daily.     pantoprazole (PROTONIX) 40 MG tablet Take 40 mg by mouth daily.     prochlorperazine (COMPAZINE) 5 MG tablet Take 5 mg by mouth 2 (two) times daily as needed for nausea or vomiting.     No current facility-administered medications for this visit.    Allergies: Bactrim [sulfamethoxazole-trimethoprim], Codeine, Gabapentin, Levaquin [levofloxacin], and Tramadol  Past Medical History:  Diagnosis Date   Bronchitis    hx of   Depression    Hypertension    takes meds daily   Vertigo     Past  Surgical History:  Procedure Laterality Date   COLONOSCOPY     LUMBAR LAMINECTOMY/DECOMPRESSION MICRODISCECTOMY  11/08/2011   Procedure: LUMBAR LAMINECTOMY/DECOMPRESSION MICRODISCECTOMY 1 LEVEL;  Surgeon: Elaina Hoops, MD;  Location: [facility];  Service: Neurosurgery;  Laterality: Left;  left lumbar four-five decompression laminectomy microdiscectomy   WISDOM TOOTH EXTRACTION      Family History  Problem Relation Age of Onset   Alcoholism Father     Social History   Tobacco Use   Smoking status: Never   Smokeless tobacco: Never  Substance Use Topics   Alcohol use: No    Subjective:   Presents today as a new patient; has been having majority of care managed by his providers through the [place]; is scheduled to have an MRI tomorrow with his neurosurgeon to further evaluate worsening chronic back issues;   Sees his PCP at the [facility] 2x per year- managing HTN, GERD;  Does have history of hyperlipidemia- refuses to take statin therapy; Does not want our office to take over his primary care needs; asking for referral to cardiology through [facility] to follow up on abnormal CXR that was done at [facility] earlier this month;       Objective:  Vitals:   03/02/22 1328 03/02/22 1409  BP: (!) 142/80 Marland Kitchen)  140/80  Pulse: (!) 59   Resp: 18   SpO2: 98%   Weight: 298 lb (135.2 kg)   Height: '6\' 6"'$  (1.981 m)     General: Well developed, well nourished, in no acute distress  Skin : Warm and dry.  Head: Normocephalic and atraumatic  Lungs: Respirations unlabored; clear to auscultation bilaterally without wheeze, rales, rhonchi  CVS exam: normal rate and regular rhythm.  Neurologic: Alert and oriented; speech intact; face symmetrical; moves all extremities well; CNII-XII intact without focal deficit   Assessment:  1. Abnormal CXR     Plan:  CXR done at recent [facility] visit raised question of possible left ventricular aneurysm; will refer to cardiology for further evaluation;  He plans to continue having his  primary care needs managed by his provider at the [place];  No follow-ups on file.  Orders Placed This Encounter  Procedures   Ambulatory referral to Cardiology    Referral Priority:   Routine    Referral Type:   Consultation    Referral Reason:   Specialty Services Required    Requested Specialty:   Cardiology    Number of Visits Requested:   1    Requested Prescriptions    No prescriptions requested or ordered in this encounter

## 2022-03-03 ENCOUNTER — Ambulatory Visit
Admission: RE | Admit: 2022-03-03 | Discharge: 2022-03-03 | Disposition: A | Discharge status: Home / Self Care | Payer: Medicare Other | Source: Ambulatory Visit | Attending: Neurosurgery | Admitting: Neurosurgery

## 2022-03-03 DIAGNOSIS — M5440 Lumbago with sciatica, unspecified side: Secondary | ICD-10-CM

## 2022-03-03 DIAGNOSIS — M48061 Spinal stenosis, lumbar region without neurogenic claudication: Secondary | ICD-10-CM | POA: Diagnosis not present

## 2022-03-03 DIAGNOSIS — M545 Low back pain, unspecified: Secondary | ICD-10-CM | POA: Diagnosis not present

## 2022-03-08 DIAGNOSIS — M4716 Other spondylosis with myelopathy, lumbar region: Secondary | ICD-10-CM | POA: Diagnosis not present

## 2022-03-08 DIAGNOSIS — M5116 Intervertebral disc disorders with radiculopathy, lumbar region: Secondary | ICD-10-CM | POA: Diagnosis not present

## 2022-03-08 DIAGNOSIS — M48061 Spinal stenosis, lumbar region without neurogenic claudication: Secondary | ICD-10-CM | POA: Diagnosis not present

## 2022-03-16 DIAGNOSIS — Z6834 Body mass index (BMI) 34.0-34.9, adult: Secondary | ICD-10-CM | POA: Diagnosis not present

## 2022-03-16 DIAGNOSIS — M5440 Lumbago with sciatica, unspecified side: Secondary | ICD-10-CM | POA: Diagnosis not present

## 2022-03-23 DIAGNOSIS — M5416 Radiculopathy, lumbar region: Secondary | ICD-10-CM | POA: Diagnosis not present

## 2022-03-29 ENCOUNTER — Encounter: Payer: Self-pay | Admitting: Internal Medicine

## 2022-03-29 ENCOUNTER — Ambulatory Visit: Payer: Medicare Other | Attending: Internal Medicine | Admitting: Internal Medicine

## 2022-03-29 VITALS — BP 118/80 | HR 92 | Ht 78.0 in | Wt 291.8 lb

## 2022-03-29 DIAGNOSIS — I253 Aneurysm of heart: Secondary | ICD-10-CM | POA: Diagnosis not present

## 2022-03-29 DIAGNOSIS — R0609 Other forms of dyspnea: Secondary | ICD-10-CM | POA: Insufficient documentation

## 2022-03-29 MED ORDER — IVABRADINE HCL 7.5 MG PO TABS: ORAL_TABLET | ORAL | 0 refills | Status: DC

## 2022-03-29 NOTE — Progress Notes (Signed)
Cardiology Office Note:    Date:  03/29/2022   ID:  Charles Navarro, DOB [birth year], MRN [id number]  PCP:  [facility] Cardiologist:  Werner Lean, MD     Referring MD: Marrian Salvage,*   CC: LV aneurysm eval Consulted for the evaluation of LV aneurysm and thoracic aortic aneurysm at the behest of Ms. Valere Dross  History of Present Illness:    Charles Navarro is a 72 y.o. male with a hx of OSA and HTN.  Concerns for an LV apical aneurysm and a TAA on a CXR.  Patient notes that he is feeling better.   Since 2021: he has been getting respiratory problems every year.  This year he has an CXR that noted a LV aneurysm and pericardial calcifications (do not have primary images)  Has had no chest pain, chest pressure, chest tightness, chest stinging .  Patient exertion has been minimal since December due to SOB that has been attributed to his breathing issues and infections.   No shortness of breath, but notes .  Cannot lay flat due to back pain.  No weight gain, leg swelling , or abdominal swelling.  No syncope or near syncope . Notes  no palpitations or funny heart beats.     Has had spinal stenosis. Has new weight loss. No history of COPD.   Past Medical History:  Diagnosis Date   Bronchitis    hx of   Depression    Hypertension    takes meds daily   Vertigo     Past Surgical History:  Procedure Laterality Date   COLONOSCOPY     LUMBAR LAMINECTOMY/DECOMPRESSION MICRODISCECTOMY  11/08/2011   Procedure: LUMBAR LAMINECTOMY/DECOMPRESSION MICRODISCECTOMY 1 LEVEL;  Surgeon: Elaina Hoops, MD;  Location: [facility];  Service: Neurosurgery;  Laterality: Left;  left lumbar four-five decompression laminectomy microdiscectomy   WISDOM TOOTH EXTRACTION      Current Medications: Current Meds  Medication Sig   acetaminophen (TYLENOL) 325 MG tablet as needed for moderate pain or headache.   b complex vitamins tablet Take 1 tablet by  mouth daily.   chlorpheniramine (CHLOR-TRIMETON) 4 MG tablet Take 4 mg by mouth 2 (two) times daily as needed for allergies.   Cholecalciferol (VITAMIN D) 2000 UNITS CAPS Take 1 capsule by mouth 2 (two) times daily.   Cyanocobalamin (B-12 PO) Take 1 tablet by mouth daily.   cyclobenzaprine (FLEXERIL) 5 MG tablet Take 5 mg by mouth 3 (three) times daily as needed for muscle spasms (muscle spasm).   ivabradine (CORLANOR) 7.5 MG TABS tablet Take 2 hours prior to Cardiac CT   lidocaine (LIDODERM) 5 % as needed (back pain).   lisinopril-hydrochlorothiazide (ZESTORETIC) 10-12.5 MG tablet Take 1 tablet by mouth daily.   loratadine (CLARITIN) 10 MG tablet as needed for allergies.   magnesium oxide (MAG-OX) 400 (240 Mg) MG tablet Take 400 mg by mouth daily.   meclizine (ANTIVERT) 25 MG tablet Take 25 mg by mouth 3 (three) times daily as needed for dizziness. 1 - 3 tablets as needed for dizziness   Multiple Vitamin (MULTIVITAMIN WITH MINERALS) TABS Take 1 tablet by mouth daily.   Omega-3 Fatty Acids (FISH OIL) 1000 MG CAPS Take 1,000 mg by mouth 2 (two) times daily.   pantoprazole (PROTONIX) 40 MG tablet Take 40 mg by mouth as needed (heartburn).   prochlorperazine (COMPAZINE) 5 MG tablet Take 5 mg by mouth 2 (two) times daily as needed for nausea or  vomiting.   Psyllium (METAMUCIL 4 IN 1 FIBER) 43 % POWD Take by mouth daily. 2 tsp daily except sat, sun   zinc gluconate 50 MG tablet Take 50 mg by mouth daily.   [DISCONTINUED] diclofenac (VOLTAREN) 50 MG EC tablet Take 50 mg by mouth 2 (two) times daily as needed.   [DISCONTINUED] diphenhydrAMINE HCl, Sleep, 25 MG CAPS Take 1 tablet by mouth daily as needed.   [DISCONTINUED] Melatonin 3-10 MG TABS Take 3 mg by mouth.     Allergies:   Bactrim [sulfamethoxazole-trimethoprim], Codeine, Gabapentin, Levaquin [levofloxacin], Oxycodone-acetaminophen, and Tramadol   Social History   Socioeconomic History   Marital status: Married    Spouse name: Not on file    Number of children: Not on file   Years of education: Not on file   Highest education level: Not on file  Occupational History   Not on file  Tobacco Use   Smoking status: Never   Smokeless tobacco: Never  Vaping Use   Vaping Use: Never used  Substance and Sexual Activity   Alcohol use: No   Drug use: No   Sexual activity: Not on file  Other Topics Concern   Not on file  Social History Narrative   Not on file   Social Determinants of Health   Financial Resource Strain: Not on file  Food Insecurity: Not on file  Transportation Needs: Not on file  Physical Activity: Not on file  Stress: Not on file  Social Connections: Not on file     Family History: The patient's family history includes Alcoholism in his father.  ROS:   Please see the history of present illness.     All other systems reviewed and are negative.  EKGs/Labs/Other Studies Reviewed:    The following studies were reviewed today:  EKG:  EKG is  ordered today.  The ekg ordered today demonstrates  03/29/22: SR pulmonary disease pattern, septal and inferior infarct pattern, LAFB  Recent Labs: No results found for requested labs within last 365 days.  Recent Lipid Panel No results found for: "CHOL", "TRIG", "HDL", "CHOLHDL", "VLDL", "LDLCALC", "LDLDIRECT"    Physical Exam:    VS:  BP 118/80   Pulse 92   Ht 6\' 6"  (1.981 m)   Wt 291 lb 12.8 oz (132.4 kg)   SpO2 98%   BMI 33.72 kg/m     Wt Readings from Last 3 Encounters:  03/29/22 291 lb 12.8 oz (132.4 kg)  03/02/22 298 lb (135.2 kg)  06/13/19 292 lb (132.5 kg)    GEN:  Well nourished, well developed in no acute distress HEENT: Normal NECK: No JVD CARDIAC: RRR, no murmurs, rubs, gallops RESPIRATORY:  Clear to auscultation without rales, wheezing or rhonchi  ABDOMEN: Soft, non-tender, non-distended MUSCULOSKELETAL:  trace edema; No deformity  SKIN: Warm and dry NEUROLOGIC:  Alert and oriented x 3 PSYCHIATRIC:  Normal affect   ASSESSMENT:     1. DOE (dyspnea on exertion)   2. Left ventricular aneurysm     PLAN:    DOE TAA by CXR LV apical aneurysm by OSH imaging - could be CAD, would want to clarify actual TAA size, and if there is truly an LV aneurysm - all of this could be accomplished with Gated CT Aorta and CT coronary - will do this test with ivabradine; if obstructive CAD would started on medical therapy  Spring summer f/u with our team        Medication Adjustments/Labs and Tests Ordered: Current medicines  are reviewed at length with the patient today.  Concerns regarding medicines are outlined above.  Orders Placed This Encounter  Procedures   CT CORONARY MORPH W/CTA COR W/SCORE W/CA W/CM &/OR WO/CM   CT ANGIO CHEST AORTA W/CM & OR WO/CM   Basic metabolic panel   EKG [id number]   Meds ordered this encounter  Medications   ivabradine (CORLANOR) 7.5 MG TABS tablet    Sig: Take 2 hours prior to Cardiac CT    Dispense:  2 tablet    Refill:  0    Patient Instructions  Medication Instructions:  Your physician recommends that you continue on your current medications as directed. Please refer to the Current Medication list given to you today.  *If you need a refill on your cardiac medications before your next appointment, please call your pharmacy*   Lab Work: TODAY: BMP  If you have labs (blood work) drawn today and your tests are completely normal, you will receive your results only by: [place] (if you have MyChart) OR A paper copy in the mail If you have any lab test that is abnormal or we need to change your treatment, we will call you to review the results.   Testing/Procedures: Your physician has requested that you have cardiac CT. Cardiac computed tomography (CT) is a painless test that uses an x-ray machine to take clear, detailed pictures of your heart. For further information please visit HugeFiesta.[email or web address]. Please follow instruction sheet as given.   Your physician has requested  that you have a CT Aorta.   Follow-Up: At [facility], you and your health needs are our priority.  As part of our continuing mission to provide you with exceptional heart care, we have created designated Provider Care Teams.  These Care Teams include your primary Cardiologist (physician) and Advanced Practice Providers (APPs -  Physician Assistants and Nurse Practitioners) who all work together to provide you with the care you need, when you need it.  We recommend signing up for the patient portal called "MyChart".  Sign up information is provided on this After Visit Summary.  MyChart is used to connect with patients for Virtual Visits (Telemedicine).  Patients are able to view lab/test results, encounter notes, upcoming appointments, etc.  Non-urgent messages can be sent to your provider as well.   To learn more about what you can do with MyChart, go to [email or web address].    Your next appointment:   3-4 month(s)  Provider:  Rudean Haskell, Ambrose Pancoast, NP, Nicholes Rough, PA or Christen Bame, NP     Other Instructions   Your cardiac CT will be scheduled at one of the below locations:   [facility] [address] [place], [place] [address] [phone]  [facility] [address] [place], [place] [address] [phone]  [facility] [place], [place] [address] [phone]  If scheduled at [facility], please arrive at the [facility] and Children's Entrance (Entrance C2) of [facility] 30 minutes prior to test start time. You can use the FREE valet parking offered at entrance C (encouraged to control the heart rate for the test)  Proceed to the [facility] Radiology Department (first floor) to check-in and test prep.  All radiology patients and guests should use entrance C2 at [facility], accessed from [facility],  even though the hospital's physical address listed is [address].    If scheduled at [facility] or [facility], please arrive 15 mins early for check-in and test prep.   Please follow these instructions carefully (unless otherwise directed):  Hold all erectile dysfunction medications at least 3 days (72 hrs) prior to test. (Ie viagra, cialis, sildenafil, tadalafil, etc) We will administer nitroglycerin during this exam.   On the Night Before the Test: Be sure to Drink plenty of water. Do not consume any caffeinated/decaffeinated beverages or chocolate 12 hours prior to your test. Do not take any antihistamines 12 hours prior to your test.  On the Day of the Test: Drink plenty of water until 1 hour prior to the test. Do not eat any food 1 hour prior to test. You may take your regular medications prior to the test.  Take Ivabradine (Corlanor) 15 mg by mouth two hours prior to test. If you take Furosemide/Hydrochlorothiazide/Spironolactone, please HOLD on the morning of the test.          After the Test: Drink plenty of water. After receiving IV contrast, you may experience a mild flushed feeling. This is normal. On occasion, you may experience a mild rash up to 24 hours after the test. This is not dangerous. If this occurs, you can take Benadryl 25 mg and increase your fluid intake. If you experience trouble breathing, this can be serious. If it is severe call 911 IMMEDIATELY. If it is mild, please call our office. If you take any of these medications: Glipizide/Metformin, Avandament, Glucavance, please do not take 48 hours after completing test unless otherwise instructed.  We will call to schedule your test 2-4 weeks out understanding that some insurance companies will need an authorization prior to the service being performed.   For non-scheduling related questions, please contact the cardiac imaging nurse navigator  should you have any questions/concerns: Marchia Bond, Cardiac Imaging Nurse Navigator Gordy Clement, Cardiac Imaging Nurse Navigator [facility] Direct Office Dial: [phone]   For scheduling needs, including cancellations and rescheduling, please call [place], [phone].     Signed, Werner Lean, MD  03/29/2022 4:04 PM    [place]

## 2022-03-29 NOTE — Patient Instructions (Addendum)
Medication Instructions:  Your physician recommends that you continue on your current medications as directed. Please refer to the Current Medication list given to you today.  *If you need a refill on your cardiac medications before your next appointment, please call your pharmacy*   Lab Work: TODAY: BMP  If you have labs (blood work) drawn today and your tests are completely normal, you will receive your results only by: [place] (if you have MyChart) OR A paper copy in the mail If you have any lab test that is abnormal or we need to change your treatment, we will call you to review the results.   Testing/Procedures: Your physician has requested that you have cardiac CT. Cardiac computed tomography (CT) is a painless test that uses an x-ray machine to take clear, detailed pictures of your heart. For further information please visit HugeFiesta.tn. Please follow instruction sheet as given.   Your physician has requested that you have a CT Aorta.   Follow-Up: At [facility], you and your health needs are our priority.  As part of our continuing mission to provide you with exceptional heart care, we have created designated Provider Care Teams.  These Care Teams include your primary Cardiologist (physician) and Advanced Practice Providers (APPs -  Physician Assistants and Nurse Practitioners) who all work together to provide you with the care you need, when you need it.  We recommend signing up for the patient portal called "MyChart".  Sign up information is provided on this After Visit Summary.  MyChart is used to connect with patients for Virtual Visits (Telemedicine).  Patients are able to view lab/test results, encounter notes, upcoming appointments, etc.  Non-urgent messages can be sent to your provider as well.   To learn more about what you can do with MyChart, go to [email or web address].    Your next appointment:   3-4 month(s)  Provider:  Rudean Haskell, Ambrose Pancoast, NP, Nicholes Rough, PA or Christen Bame, NP     Other Instructions   Your cardiac CT will be scheduled at one of the below locations:   [facility] [address] [place], [place] [address] [phone]  [facility] [address] [place], [place] [address] [phone]  [facility] [place], [place] [address] [phone]  If scheduled at [facility], please arrive at the [facility] and Children's Entrance (Entrance C2) of [facility] 30 minutes prior to test start time. You can use the FREE valet parking offered at entrance C (encouraged to control the heart rate for the test)  Proceed to the [facility] Radiology Department (first floor) to check-in and test prep.  All radiology patients and guests should use entrance C2 at [facility], accessed from [facility], even though the hospital's physical address listed is [address].    If scheduled at [facility] or [facility], please arrive 15 mins early for check-in and test prep.   Please follow these instructions carefully (unless otherwise directed):  Hold all erectile dysfunction medications at least 3 days (72 hrs) prior to test. (Ie viagra, cialis, sildenafil, tadalafil, etc) We will administer nitroglycerin during this exam.   On the Night Before the Test: Be sure to Drink plenty of water. Do not consume any caffeinated/decaffeinated beverages or chocolate 12 hours prior to your test. Do not take any antihistamines 12 hours  prior to your test.  On the Day of the Test: Drink plenty of water until 1 hour prior to the test. Do not eat any food 1 hour prior to test. You may take your regular medications prior to the test.  Take Ivabradine (Corlanor) 15 mg by mouth two hours prior to  test. If you take Furosemide/Hydrochlorothiazide/Spironolactone, please HOLD on the morning of the test.          After the Test: Drink plenty of water. After receiving IV contrast, you may experience a mild flushed feeling. This is normal. On occasion, you may experience a mild rash up to 24 hours after the test. This is not dangerous. If this occurs, you can take Benadryl 25 mg and increase your fluid intake. If you experience trouble breathing, this can be serious. If it is severe call 911 IMMEDIATELY. If it is mild, please call our office. If you take any of these medications: Glipizide/Metformin, Avandament, Glucavance, please do not take 48 hours after completing test unless otherwise instructed.  We will call to schedule your test 2-4 weeks out understanding that some insurance companies will need an authorization prior to the service being performed.   For non-scheduling related questions, please contact the cardiac imaging nurse navigator should you have any questions/concerns: Marchia Bond, Cardiac Imaging Nurse Navigator Gordy Clement, Cardiac Imaging Nurse Navigator [facility] Direct Office Dial: [phone]   For scheduling needs, including cancellations and rescheduling, please call [place], [phone].

## 2022-03-30 LAB — BASIC METABOLIC PANEL WITH GFR
BUN/Creatinine Ratio: 11 (ref 10–24)
BUN: 13 mg/dL (ref 8–27)
CO2: 26 mmol/L (ref 20–29)
Calcium: 10.4 mg/dL — ABNORMAL HIGH (ref 8.6–10.2)
Chloride: 92 mmol/L — ABNORMAL LOW (ref 96–106)
Creatinine, Ser: 1.19 mg/dL (ref 0.76–1.27)
Glucose: 91 mg/dL (ref 70–99)
Potassium: 4.4 mmol/L (ref 3.5–5.2)
Sodium: 136 mmol/L (ref 134–144)
eGFR: 65 mL/min/1.73

## 2022-04-06 ENCOUNTER — Telehealth (HOSPITAL_COMMUNITY): Payer: Self-pay | Admitting: *Deleted

## 2022-04-06 NOTE — Telephone Encounter (Signed)
Reaching out to patient to offer assistance regarding upcoming cardiac imaging study; pt verbalizes understanding of appt date/time, parking situation and where to check in, pre-test NPO status and medications ordered, and verified current allergies; name and call back number provided for further questions should they arise  Gordy Clement RN Navigator Cardiac Imaging [facility] [phone] office [phone] cell  Patient to take 15mg  ivabradine two hours prior to his cardiac CT scan.  He is aware to arrive at 11am.

## 2022-04-07 ENCOUNTER — Ambulatory Visit (HOSPITAL_COMMUNITY)
Admission: RE | Admit: 2022-04-07 | Discharge: 2022-04-07 | Disposition: A | Discharge status: Home / Self Care | Payer: Medicare Other | Source: Ambulatory Visit | Attending: Internal Medicine | Admitting: Internal Medicine

## 2022-04-07 ENCOUNTER — Ambulatory Visit (HOSPITAL_BASED_OUTPATIENT_CLINIC_OR_DEPARTMENT_OTHER)
Admission: RE | Admit: 2022-04-07 | Discharge: 2022-04-07 | Disposition: A | Discharge status: Home / Self Care | Payer: Medicare Other | Source: Ambulatory Visit | Attending: Cardiology | Admitting: Cardiology

## 2022-04-07 ENCOUNTER — Other Ambulatory Visit: Payer: Self-pay | Admitting: Cardiology

## 2022-04-07 DIAGNOSIS — R0609 Other forms of dyspnea: Secondary | ICD-10-CM | POA: Diagnosis not present

## 2022-04-07 DIAGNOSIS — I2510 Atherosclerotic heart disease of native coronary artery without angina pectoris: Secondary | ICD-10-CM

## 2022-04-07 DIAGNOSIS — R931 Abnormal findings on diagnostic imaging of heart and coronary circulation: Secondary | ICD-10-CM

## 2022-04-07 DIAGNOSIS — I253 Aneurysm of heart: Secondary | ICD-10-CM | POA: Diagnosis not present

## 2022-04-07 MED ORDER — IOHEXOL 350 MG/ML SOLN
100.0000 mL | Freq: Once | INTRAVENOUS | Status: AC | PRN
  Administered 2022-04-07: 100 mL via INTRAVENOUS

## 2022-04-07 MED ORDER — NITROGLYCERIN 0.4 MG SL SUBL
SUBLINGUAL_TABLET | SUBLINGUAL | Status: AC
  Filled 2022-04-07: qty 2

## 2022-04-07 MED ORDER — NITROGLYCERIN 0.4 MG SL SUBL
0.8000 mg | SUBLINGUAL_TABLET | Freq: Once | SUBLINGUAL | Status: AC
  Administered 2022-04-07: 0.8 mg via SUBLINGUAL

## 2022-04-13 ENCOUNTER — Other Ambulatory Visit: Payer: Medicare Other

## 2022-04-13 ENCOUNTER — Telehealth: Payer: Self-pay

## 2022-04-13 DIAGNOSIS — I318 Other specified diseases of pericardium: Secondary | ICD-10-CM

## 2022-04-13 DIAGNOSIS — M5416 Radiculopathy, lumbar region: Secondary | ICD-10-CM | POA: Diagnosis not present

## 2022-04-13 DIAGNOSIS — I77819 Aortic ectasia, unspecified site: Secondary | ICD-10-CM

## 2022-04-13 DIAGNOSIS — M5440 Lumbago with sciatica, unspecified side: Secondary | ICD-10-CM | POA: Diagnosis not present

## 2022-04-13 MED ORDER — ASPIRIN 81 MG PO TBEC: 81.0000 mg | DELAYED_RELEASE_TABLET | Freq: Every day | ORAL | 3 refills | Status: DC

## 2022-04-13 NOTE — Telephone Encounter (Signed)
-----   Message from Christell Constant, MD sent at 04/07/2022  6:23 PM EDT ----- Results: Mild aortic dilation Pericardial calcification Moderate CAD Plan: ASA 81 mg Lipids echo  Christell Constant, MD

## 2022-04-13 NOTE — Telephone Encounter (Signed)
The patient has been notified of the result and verbalized understanding.  All questions (if any) were answered. Macie Burows, RN 04/13/2022 4:27 PM   Echo and FLP scheduled for 04/27/22.  Pt currently taking ASA 81 mg PO QD.

## 2022-04-15 ENCOUNTER — Telehealth: Payer: Self-pay | Admitting: *Deleted

## 2022-04-15 ENCOUNTER — Other Ambulatory Visit: Payer: Self-pay | Admitting: Neurosurgery

## 2022-04-15 NOTE — Telephone Encounter (Signed)
   Pre-operative Risk Assessment    Patient Name: Charles Navarro  DOB: 1950-10-02 MRN: [id number]      Request for Surgical Clearance    Procedure:   L2-3, L3-4 LUMBAR LAMINECTOMY  Date of Surgery:  Clearance TBD                               Surgeon:  DR. Donalee Citrin Surgeon's Group or Practice Name:  [facility] Phone number:  [phone] Fax number:  [phone] ATTN: VANESSA x [phone]   Type of Clearance Requested:   - Medical ; ASA    Type of Anesthesia:  General    Additional requests/questions:    Elpidio Anis   04/15/2022, 5:25 PM

## 2022-04-20 ENCOUNTER — Encounter (HOSPITAL_COMMUNITY): Payer: Self-pay | Admitting: Neurosurgery

## 2022-04-20 ENCOUNTER — Other Ambulatory Visit: Payer: Self-pay

## 2022-04-20 NOTE — Anesthesia Preprocedure Evaluation (Signed)
Anesthesia Evaluation  Patient identified by MRN, date of birth, ID band Patient awake    Reviewed: Allergy & Precautions, NPO status , Patient's Chart, lab work & pertinent test results  Airway Mallampati: II  TM Distance: >3 FB Neck ROM: Full    Dental  (+) Teeth Intact, Dental Advisory Given   Pulmonary sleep apnea and Continuous Positive Airway Pressure Ventilation , COPD   Pulmonary exam normal breath sounds clear to auscultation       Cardiovascular hypertension, Pt. on medications Normal cardiovascular exam Rhythm:Regular Rate:Normal     Neuro/Psych  PSYCHIATRIC DISORDERS  Depression    negative neurological ROS     GI/Hepatic Neg liver ROS,GERD  Medicated,,  Endo/Other  Obesity   Renal/GU negative Renal ROS     Musculoskeletal negative musculoskeletal ROS (+) Arthritis ,    Abdominal   Peds  Hematology negative hematology ROS (+)   Anesthesia Other Findings Day of surgery medications reviewed with the patient.  Reproductive/Obstetrics                             Anesthesia Physical Anesthesia Plan  ASA: 3  Anesthesia Plan: General   Post-op Pain Management: Tylenol PO (pre-op)*   Induction: Intravenous  PONV Risk Score and Plan: 2 and Dexamethasone and Ondansetron  Airway Management Planned: Oral ETT  Additional Equipment:   Intra-op Plan:   Post-operative Plan: Extubation in OR  Informed Consent: I have reviewed the patients History and Physical, chart, labs and discussed the procedure including the risks, benefits and alternatives for the proposed anesthesia with the patient or authorized representative who has indicated his/her understanding and acceptance.     Dental advisory given  Plan Discussed with: CRNA  Anesthesia Plan Comments: (PAT note by Antionette Poles, PA-C: 72 yo male with pertinent hx including OSA with inconsistent CPAP use and HTN. Pt recently  evaluated by cardiologist Dr. Izora Ribas for concern of LV apical aneurysm and thoracic aortic aneurysm on CXR. Dr. Izora Ribas summarized recent testing and commented on surgical risk in telephone encounter 04/15/22, "CT was performed: He has moderate non obstructive CAD, a mild aorta dilation, pericardial calcifications on unclear significance, and a small apical aneurysm.  He has lung scarring that appears stable from 2021. He presented with DOE and limited activity largely related to his spinal stenosis. The purpose of the echocardiogram was to rule out constrictive pericarditis given the degree of his pericardial calcifications and to evaluated the etiology of his apical aneurysm (rule out apical HCM). The apical thinning is very small. There does not appear severe atrial dilation, IVC dilation, septal shudder on the CT (not test of choice for this evaluation). He is likely low-moderate risk from a cardiac perspective. Could need low dose diuretics short term post procedure. No barriers to holding his ASA Peri-procedurally. CC ing Dr. Wynetta Emery.  If there was an anesthesiologist listed for this surgery, I would CC them as well. He should still get the echocardiogram, though it may need rescheduled."  Pt will need DOS labs and evaluation.  EKG 03/29/22: NSR. Rate 92. Pulmonary disease pattern. LAFB. Septal infarct, age undetermined. Inferior infarct, age undetermined.   Coronary CTA 04/07/22: IMPRESSION: 1. Coronary calcium score of 2016. This was 47 percentile for age and sex matched control.  2. Total plaque volume (TPV) 1399 mm3 which is 84th percentile for age-and sex matched controls (calcified plaque 539 mm3; non-calcified plaque 860 mm3). TPV is extensive.  3. Normal  coronary origin with right dominance.  4. Moderate diffuse CAD. Distal RCA FFR 0.7 (0.86 mid). Distal LAD FFR 0.73 (cumulative decrease).  5. Mildly dilated ascending aorta 42 mm.  6. Calcified pericardium mostly along  left lateral LV wall.  7. Apical thinning of myocardium.  )        Anesthesia Quick Evaluation

## 2022-04-20 NOTE — Progress Notes (Signed)
SDW call  Patient was given pre-op instructions over the phone. Patient verbalized understanding of instructions provided.     PCP - Dr. Burnell Blanks Cardiologist - Dr. Riley Lam   PPM/ICD - Denies   Chest x-ray - 06/13/2021 EKG -  03/29/2022 Stress Test - ECHO - 04/27/2022 Cardiac Cath -   Sleep Study/sleep apnea/CPAP: Yes, diagnosed with sleep apnea. Uses CPAP sometimes  Non-diabetic  Blood Thinner Instructions: Denies Aspirin Instructions: States last dose was Wednesday April 14, 2022   ERAS Protcol - Yes, clear liquids until 1130 PRE-SURGERY Ensure or G2- no   COVID TEST- n/a    Anesthesia review: Yes, HTN, aoric dilation, CAD, pericardial calcification, OSA   Patient denies shortness of breath, fever, cough and chest pain over the phone call    Your procedure is scheduled on Wednesday April 21, 2022  Report to [facility] Main Entrance "A" at  [facility] then check in with the Admitting office.  Call this number if you have problems the morning of surgery:  [phone]   If you have any questions prior to your surgery date call [phone]: Open Monday-Friday 8am-4pm If you experience any cold or flu symptoms such as cough, fever, chills, shortness of breath, etc. between now and your scheduled surgery, please notify us at the above number     Remember:  Do not eat after midnight the night before your surgery  You may drink clear liquids until 1130    the morning of your surgery.   Clear liquids allowed are: Water, Non-Citrus Juices (without pulp), Carbonated Beverages, Clear Tea, Black Coffee ONLY (NO MILK, CREAM OR POWDERED CREAMER of any kind), and Gatorade   Take these medicines as needed the morning of surgery with A SIP OF WATER:  Tylenol, flexeril, loratadine, meclizine, protonix, phenergan  As of today, STOP taking any Aspirin (unless otherwise instructed by your surgeon) Aleve, Naproxen, Ibuprofen, Motrin, Advil, Goody's, BC's, all herbal  medications, fish oil, and all vitamins.

## 2022-04-20 NOTE — Telephone Encounter (Signed)
   Patient Name: Charles Navarro  DOB: [birth year] MRN: [id number]  Primary Cardiologist: Christell Constant, MD  Chart reviewed as part of pre-operative protocol coverage. Pre-op clearance already addressed by colleagues in earlier phone notes. To summarize recommendations:  -CT was performed: He has moderate non obstructive CAD, a mild aorta dilation, pericardial calcifications on unclear significance, and a small apical aneurysm.  He has lung scarring that appears stable from 2021.   He presented with DOE and limited activity largely related to his spinal stenosis.  The purpose of the echocardiogram was to rule out constrictive pericarditis given the degree of his pericardial calcifications and to evaluated the etiology of his apical aneurysm (rule out apical HCM).  The apical thinning is very small. There does not appear severe atrial dilation, IVC dilation, septal shudder on the CT (not test of choice for this evaluation).   He is likely low-moderate risk from a cardiac perspective. Could need low dose diuretics short term post procedure.  No barriers to holding his ASA Peri-procedurally.   CC ing Dr. Wynetta Emery.  If there was an anesthesiologist listed for this surgery, I would CC them as well   He should still get the echocardiogram, thought it may need rescheduled.   Thanks,  MAC   Will route this bundled recommendation to requesting provider via Epic fax function and remove from pre-op pool. Please call with questions.  Sharlene Dory, PA-C 04/20/2022, 2:18 PM

## 2022-04-20 NOTE — Progress Notes (Signed)
Anesthesia Chart Review: Same day workup  72 yo male with pertinent hx including OSA with inconsistent CPAP use and HTN. Pt recently evaluated by cardiologist Dr. Izora Ribas for concern of LV apical aneurysm and thoracic aortic aneurysm on CXR. Dr. Izora Ribas summarized recent testing and commented on surgical risk in telephone encounter 04/15/22, "CT was performed: He has moderate non obstructive CAD, a mild aorta dilation, pericardial calcifications on unclear significance, and a small apical aneurysm.  He has lung scarring that appears stable from 2021. He presented with DOE and limited activity largely related to his spinal stenosis. The purpose of the echocardiogram was to rule out constrictive pericarditis given the degree of his pericardial calcifications and to evaluated the etiology of his apical aneurysm (rule out apical HCM). The apical thinning is very small. There does not appear severe atrial dilation, IVC dilation, septal shudder on the CT (not test of choice for this evaluation). He is likely low-moderate risk from a cardiac perspective. Could need low dose diuretics short term post procedure. No barriers to holding his ASA Peri-procedurally. CC ing Dr. Wynetta Emery.  If there was an anesthesiologist listed for this surgery, I would CC them as well. He should still get the echocardiogram, though it may need rescheduled."  Pt will need DOS labs and evaluation.  EKG 03/29/22: NSR. Rate 92. Pulmonary disease pattern. LAFB. Septal infarct, age undetermined. Inferior infarct, age undetermined.   Coronary CTA 04/07/22: IMPRESSION: 1. Coronary calcium score of 2016. This was 28 percentile for age and sex matched control.   2. Total plaque volume (TPV) 1399 mm3 which is 84th percentile for age-and sex matched controls (calcified plaque 539 mm3; non-calcified plaque 860 mm3). TPV is extensive.   3. Normal coronary origin with right dominance.   4. Moderate diffuse CAD. Distal RCA FFR 0.7 (0.86  mid). Distal LAD FFR 0.73 (cumulative decrease).   5. Mildly dilated ascending aorta 42 mm.   6. Calcified pericardium mostly along left lateral LV wall.   7. Apical thinning of myocardium.    Zannie Cove [facility] Stay [facility]/Anesthesiology Phone [phone] 04/20/2022 3:10 PM

## 2022-04-21 ENCOUNTER — Ambulatory Visit (HOSPITAL_BASED_OUTPATIENT_CLINIC_OR_DEPARTMENT_OTHER): Payer: Medicare Other | Admitting: Physician Assistant

## 2022-04-21 ENCOUNTER — Other Ambulatory Visit: Payer: Self-pay

## 2022-04-21 ENCOUNTER — Encounter (HOSPITAL_COMMUNITY): Payer: Self-pay | Admitting: Neurosurgery

## 2022-04-21 ENCOUNTER — Ambulatory Visit (HOSPITAL_COMMUNITY): Payer: Medicare Other

## 2022-04-21 ENCOUNTER — Ambulatory Visit (HOSPITAL_COMMUNITY): Payer: Medicare Other | Admitting: Physician Assistant

## 2022-04-21 ENCOUNTER — Ambulatory Visit (HOSPITAL_COMMUNITY)
Admission: RE | Admit: 2022-04-21 | Discharge: 2022-04-22 | Disposition: A | Discharge status: Home / Self Care | Payer: Medicare Other | Attending: Neurosurgery | Admitting: Neurosurgery

## 2022-04-21 ENCOUNTER — Ambulatory Visit (HOSPITAL_COMMUNITY)
Admission: RE | Disposition: A | Discharge status: Home / Self Care | Payer: Self-pay | Source: Home / Self Care | Attending: Neurosurgery

## 2022-04-21 DIAGNOSIS — E669 Obesity, unspecified: Secondary | ICD-10-CM | POA: Diagnosis not present

## 2022-04-21 DIAGNOSIS — M1990 Unspecified osteoarthritis, unspecified site: Secondary | ICD-10-CM | POA: Insufficient documentation

## 2022-04-21 DIAGNOSIS — I10 Essential (primary) hypertension: Secondary | ICD-10-CM | POA: Diagnosis not present

## 2022-04-21 DIAGNOSIS — Z4789 Encounter for other orthopedic aftercare: Secondary | ICD-10-CM | POA: Diagnosis not present

## 2022-04-21 DIAGNOSIS — M5416 Radiculopathy, lumbar region: Secondary | ICD-10-CM | POA: Insufficient documentation

## 2022-04-21 DIAGNOSIS — G4733 Obstructive sleep apnea (adult) (pediatric): Secondary | ICD-10-CM | POA: Insufficient documentation

## 2022-04-21 DIAGNOSIS — M48061 Spinal stenosis, lumbar region without neurogenic claudication: Secondary | ICD-10-CM | POA: Diagnosis not present

## 2022-04-21 DIAGNOSIS — Z9989 Dependence on other enabling machines and devices: Secondary | ICD-10-CM | POA: Diagnosis not present

## 2022-04-21 DIAGNOSIS — Z79899 Other long term (current) drug therapy: Secondary | ICD-10-CM | POA: Insufficient documentation

## 2022-04-21 DIAGNOSIS — I2510 Atherosclerotic heart disease of native coronary artery without angina pectoris: Secondary | ICD-10-CM | POA: Diagnosis not present

## 2022-04-21 DIAGNOSIS — J449 Chronic obstructive pulmonary disease, unspecified: Secondary | ICD-10-CM | POA: Diagnosis not present

## 2022-04-21 DIAGNOSIS — Z6831 Body mass index (BMI) 31.0-31.9, adult: Secondary | ICD-10-CM | POA: Insufficient documentation

## 2022-04-21 DIAGNOSIS — K219 Gastro-esophageal reflux disease without esophagitis: Secondary | ICD-10-CM | POA: Diagnosis not present

## 2022-04-21 HISTORY — DX: Sleep apnea, unspecified: G47.30

## 2022-04-21 HISTORY — PX: LUMBAR LAMINECTOMY/DECOMPRESSION MICRODISCECTOMY: SHX5026

## 2022-04-21 HISTORY — DX: Gastro-esophageal reflux disease without esophagitis: K21.9

## 2022-04-21 HISTORY — DX: Chronic obstructive pulmonary disease, unspecified: J44.9

## 2022-04-21 LAB — CBC
HCT: 43.2 % (ref 39.0–52.0)
Hemoglobin: 15.6 g/dL (ref 13.0–17.0)
MCH: 31.8 pg (ref 26.0–34.0)
MCHC: 36.1 g/dL — ABNORMAL HIGH (ref 30.0–36.0)
MCV: 88.2 fL (ref 80.0–100.0)
Platelets: 220 10*3/uL (ref 150–400)
RBC: 4.9 MIL/uL (ref 4.22–5.81)
RDW: 13.9 % (ref 11.5–15.5)
WBC: 7 10*3/uL (ref 4.0–10.5)
nRBC: 0 % (ref 0.0–0.2)

## 2022-04-21 LAB — SURGICAL PCR SCREEN
MRSA, PCR: POSITIVE — AB
Staphylococcus aureus: POSITIVE — AB

## 2022-04-21 SURGERY — LUMBAR LAMINECTOMY/DECOMPRESSION MICRODISCECTOMY 2 LEVELS
Anesthesia: General | Site: Back | Laterality: Bilateral

## 2022-04-21 MED ORDER — LACTATED RINGERS IV SOLN: INTRAVENOUS | Status: DC

## 2022-04-21 MED ORDER — ACETAMINOPHEN 325 MG PO TABS: 650.0000 mg | ORAL_TABLET | ORAL | Status: DC | PRN

## 2022-04-21 MED ORDER — PANTOPRAZOLE SODIUM 40 MG PO TBEC
40.0000 mg | DELAYED_RELEASE_TABLET | Freq: Every day | ORAL | Status: DC
  Administered 2022-04-21: 40 mg via ORAL
  Filled 2022-04-21: qty 1

## 2022-04-21 MED ORDER — FENTANYL CITRATE (PF) 100 MCG/2ML IJ SOLN
25.0000 ug | INTRAMUSCULAR | Status: DC | PRN
  Administered 2022-04-21: 25 ug via INTRAVENOUS

## 2022-04-21 MED ORDER — SODIUM CHLORIDE 0.9 % IV SOLN: 250.0000 mL | INTRAVENOUS | Status: DC

## 2022-04-21 MED ORDER — ROCURONIUM BROMIDE 10 MG/ML (PF) SYRINGE
PREFILLED_SYRINGE | INTRAVENOUS | Status: DC | PRN
  Administered 2022-04-21: 20 mg via INTRAVENOUS
  Administered 2022-04-21: 60 mg via INTRAVENOUS
  Administered 2022-04-21: 20 mg via INTRAVENOUS

## 2022-04-21 MED ORDER — PHENYLEPHRINE HCL-NACL 20-0.9 MG/250ML-% IV SOLN
INTRAVENOUS | Status: DC | PRN
  Administered 2022-04-21: 25 ug/min via INTRAVENOUS

## 2022-04-21 MED ORDER — 0.9 % SODIUM CHLORIDE (POUR BTL) OPTIME
TOPICAL | Status: DC | PRN
  Administered 2022-04-21: 1000 mL

## 2022-04-21 MED ORDER — CEFAZOLIN SODIUM-DEXTROSE 2-4 GM/100ML-% IV SOLN
2.0000 g | Freq: Three times a day (TID) | INTRAVENOUS | Status: DC
  Administered 2022-04-21 – 2022-04-22 (×2): 2 g via INTRAVENOUS
  Filled 2022-04-21 (×2): qty 100

## 2022-04-21 MED ORDER — ONDANSETRON HCL 4 MG/2ML IJ SOLN: 4.0000 mg | Freq: Four times a day (QID) | INTRAMUSCULAR | Status: DC | PRN

## 2022-04-21 MED ORDER — LIDOCAINE-EPINEPHRINE 1 %-1:100000 IJ SOLN
INTRAMUSCULAR | Status: DC | PRN
  Administered 2022-04-21: 10 mL

## 2022-04-21 MED ORDER — ONDANSETRON HCL 4 MG/2ML IJ SOLN: 4.0000 mg | Freq: Once | INTRAMUSCULAR | Status: DC | PRN

## 2022-04-21 MED ORDER — VANCOMYCIN HCL 1000 MG IV SOLR
INTRAVENOUS | Status: DC | PRN
  Administered 2022-04-21: 1000 mg via INTRAVENOUS

## 2022-04-21 MED ORDER — LISINOPRIL-HYDROCHLOROTHIAZIDE 10-12.5 MG PO TABS: 1.0000 | ORAL_TABLET | Freq: Every day | ORAL | Status: DC

## 2022-04-21 MED ORDER — SODIUM CHLORIDE 0.9% FLUSH: 3.0000 mL | INTRAVENOUS | Status: DC | PRN

## 2022-04-21 MED ORDER — CHLORHEXIDINE GLUCONATE 0.12 % MT SOLN
15.0000 mL | Freq: Once | OROMUCOSAL | Status: AC
  Administered 2022-04-21: 15 mL via OROMUCOSAL
  Filled 2022-04-21: qty 15

## 2022-04-21 MED ORDER — FENTANYL CITRATE (PF) 250 MCG/5ML IJ SOLN
INTRAMUSCULAR | Status: DC | PRN
  Administered 2022-04-21 (×2): 50 ug via INTRAVENOUS
  Administered 2022-04-21: 150 ug via INTRAVENOUS

## 2022-04-21 MED ORDER — CHLORHEXIDINE GLUCONATE CLOTH 2 % EX PADS: 6.0000 | MEDICATED_PAD | Freq: Once | CUTANEOUS | Status: DC

## 2022-04-21 MED ORDER — LISINOPRIL 10 MG PO TABS
10.0000 mg | ORAL_TABLET | Freq: Every day | ORAL | Status: DC
  Administered 2022-04-21 – 2022-04-22 (×2): 10 mg via ORAL
  Filled 2022-04-21 (×2): qty 1

## 2022-04-21 MED ORDER — ACETAMINOPHEN 325 MG PO TABS: 650.0000 mg | ORAL_TABLET | Freq: Four times a day (QID) | ORAL | Status: DC | PRN

## 2022-04-21 MED ORDER — THROMBIN 5000 UNITS EX SOLR
CUTANEOUS | Status: AC
  Filled 2022-04-21: qty 10000

## 2022-04-21 MED ORDER — SUGAMMADEX SODIUM 200 MG/2ML IV SOLN
INTRAVENOUS | Status: DC | PRN
  Administered 2022-04-21: 200 mg via INTRAVENOUS

## 2022-04-21 MED ORDER — ROCURONIUM BROMIDE 10 MG/ML (PF) SYRINGE
PREFILLED_SYRINGE | INTRAVENOUS | Status: AC
  Filled 2022-04-21: qty 10

## 2022-04-21 MED ORDER — LIDOCAINE 2% (20 MG/ML) 5 ML SYRINGE
INTRAMUSCULAR | Status: AC
  Filled 2022-04-21: qty 5

## 2022-04-21 MED ORDER — HYDROCHLOROTHIAZIDE 12.5 MG PO TABS
12.5000 mg | ORAL_TABLET | Freq: Every day | ORAL | Status: DC
  Administered 2022-04-21 – 2022-04-22 (×2): 12.5 mg via ORAL
  Filled 2022-04-21 (×2): qty 1

## 2022-04-21 MED ORDER — LORATADINE 10 MG PO TABS: 10.0000 mg | ORAL_TABLET | Freq: Every day | ORAL | Status: DC | PRN

## 2022-04-21 MED ORDER — ACETAMINOPHEN 650 MG RE SUPP: 650.0000 mg | RECTAL | Status: DC | PRN

## 2022-04-21 MED ORDER — SODIUM CHLORIDE 0.9% FLUSH: 3.0000 mL | Freq: Two times a day (BID) | INTRAVENOUS | Status: DC

## 2022-04-21 MED ORDER — PHENOL 1.4 % MT LIQD: 1.0000 | OROMUCOSAL | Status: DC | PRN

## 2022-04-21 MED ORDER — LIDOCAINE-EPINEPHRINE 1 %-1:100000 IJ SOLN
INTRAMUSCULAR | Status: AC
  Filled 2022-04-21: qty 1

## 2022-04-21 MED ORDER — FENTANYL CITRATE (PF) 250 MCG/5ML IJ SOLN
INTRAMUSCULAR | Status: AC
  Filled 2022-04-21: qty 5

## 2022-04-21 MED ORDER — MUSCLE RUB 10-15 % EX CREA: TOPICAL_CREAM | Freq: Every day | CUTANEOUS | Status: DC | PRN

## 2022-04-21 MED ORDER — MENTHOL 3 MG MT LOZG: 1.0000 | LOZENGE | OROMUCOSAL | Status: DC | PRN

## 2022-04-21 MED ORDER — FENTANYL CITRATE (PF) 100 MCG/2ML IJ SOLN
INTRAMUSCULAR | Status: AC
  Filled 2022-04-21: qty 2

## 2022-04-21 MED ORDER — BUPIVACAINE HCL (PF) 0.25 % IJ SOLN
INTRAMUSCULAR | Status: DC | PRN
  Administered 2022-04-21: 10 mL

## 2022-04-21 MED ORDER — HYDROCODONE-ACETAMINOPHEN 5-325 MG PO TABS
2.0000 | ORAL_TABLET | ORAL | Status: DC | PRN
  Administered 2022-04-21 – 2022-04-22 (×4): 2 via ORAL
  Filled 2022-04-21 (×4): qty 2

## 2022-04-21 MED ORDER — ADULT MULTIVITAMIN W/MINERALS CH
1.0000 | ORAL_TABLET | Freq: Every day | ORAL | Status: DC
  Administered 2022-04-21 – 2022-04-22 (×2): 1 via ORAL
  Filled 2022-04-21 (×2): qty 1

## 2022-04-21 MED ORDER — DEXAMETHASONE SODIUM PHOSPHATE 10 MG/ML IJ SOLN
INTRAMUSCULAR | Status: AC
  Filled 2022-04-21: qty 1

## 2022-04-21 MED ORDER — PSYLLIUM 95 % PO PACK
1.0000 | PACK | ORAL | Status: DC
  Filled 2022-04-21: qty 1

## 2022-04-21 MED ORDER — ONDANSETRON HCL 4 MG/2ML IJ SOLN
INTRAMUSCULAR | Status: DC | PRN
  Administered 2022-04-21: 4 mg via INTRAVENOUS

## 2022-04-21 MED ORDER — HYDROMORPHONE HCL 1 MG/ML IJ SOLN: 0.5000 mg | INTRAMUSCULAR | Status: DC | PRN

## 2022-04-21 MED ORDER — ONDANSETRON HCL 4 MG PO TABS: 4.0000 mg | ORAL_TABLET | Freq: Four times a day (QID) | ORAL | Status: DC | PRN

## 2022-04-21 MED ORDER — ASPIRIN 81 MG PO TBEC
81.0000 mg | DELAYED_RELEASE_TABLET | Freq: Every day | ORAL | Status: DC
  Administered 2022-04-21 – 2022-04-22 (×2): 81 mg via ORAL
  Filled 2022-04-21 (×2): qty 1

## 2022-04-21 MED ORDER — PANTOPRAZOLE SODIUM 40 MG PO TBEC: 40.0000 mg | DELAYED_RELEASE_TABLET | Freq: Every day | ORAL | Status: DC | PRN

## 2022-04-21 MED ORDER — PANTOPRAZOLE SODIUM 40 MG IV SOLR: 40.0000 mg | Freq: Every day | INTRAVENOUS | Status: DC

## 2022-04-21 MED ORDER — ORAL CARE MOUTH RINSE: 15.0000 mL | Freq: Once | OROMUCOSAL | Status: AC

## 2022-04-21 MED ORDER — LIDOCAINE 2% (20 MG/ML) 5 ML SYRINGE
INTRAMUSCULAR | Status: DC | PRN
  Administered 2022-04-21: 100 mg via INTRAVENOUS

## 2022-04-21 MED ORDER — MENTHOL-CAMPHOR 11-11 % EX CREA: TOPICAL_CREAM | Freq: Every day | CUTANEOUS | Status: DC | PRN

## 2022-04-21 MED ORDER — DEXAMETHASONE SODIUM PHOSPHATE 10 MG/ML IJ SOLN
INTRAMUSCULAR | Status: DC | PRN
  Administered 2022-04-21: 5 mg via INTRAVENOUS

## 2022-04-21 MED ORDER — PROPOFOL 10 MG/ML IV BOLUS
INTRAVENOUS | Status: AC
  Filled 2022-04-21: qty 20

## 2022-04-21 MED ORDER — PROPOFOL 10 MG/ML IV BOLUS
INTRAVENOUS | Status: DC | PRN
  Administered 2022-04-21: 170 mg via INTRAVENOUS

## 2022-04-21 MED ORDER — CYCLOBENZAPRINE HCL 10 MG PO TABS
10.0000 mg | ORAL_TABLET | Freq: Three times a day (TID) | ORAL | Status: DC | PRN
  Administered 2022-04-21 – 2022-04-22 (×2): 10 mg via ORAL
  Filled 2022-04-21 (×2): qty 1

## 2022-04-21 MED ORDER — BUPIVACAINE HCL (PF) 0.25 % IJ SOLN
INTRAMUSCULAR | Status: AC
  Filled 2022-04-21: qty 30

## 2022-04-21 MED ORDER — ONDANSETRON HCL 4 MG/2ML IJ SOLN
INTRAMUSCULAR | Status: AC
  Filled 2022-04-21: qty 2

## 2022-04-21 MED ORDER — ALUM & MAG HYDROXIDE-SIMETH 200-200-20 MG/5ML PO SUSP: 30.0000 mL | Freq: Four times a day (QID) | ORAL | Status: DC | PRN

## 2022-04-21 MED ORDER — THROMBIN (RECOMBINANT) 5000 UNITS EX SOLR: CUTANEOUS | Status: DC | PRN

## 2022-04-21 MED ORDER — ACETAMINOPHEN 500 MG PO TABS
1000.0000 mg | ORAL_TABLET | Freq: Once | ORAL | Status: AC
  Administered 2022-04-21: 1000 mg via ORAL
  Filled 2022-04-21: qty 2

## 2022-04-21 MED ORDER — CEFAZOLIN IN SODIUM CHLORIDE 3-0.9 GM/100ML-% IV SOLN
3.0000 g | INTRAVENOUS | Status: AC
  Administered 2022-04-21: 3 g via INTRAVENOUS
  Filled 2022-04-21: qty 100

## 2022-04-21 MED ORDER — MECLIZINE HCL 25 MG PO TABS: 25.0000 mg | ORAL_TABLET | Freq: Three times a day (TID) | ORAL | Status: DC | PRN

## 2022-04-21 SURGICAL SUPPLY — 51 items

## 2022-04-21 NOTE — Transfer of Care (Signed)
Immediate Anesthesia Transfer of Care Note  Patient: Charles Navarro  Procedure(s) Performed: LUMBAR TWO-THREE, LUMBAR THREE-FOUR BILATERAL LAMINECTOMY (Bilateral: Back)  Patient Location: PACU  Anesthesia Type:General  Level of Consciousness: drowsy and patient cooperative  Airway & Oxygen Therapy: Patient Spontanous Breathing and Patient connected to face mask oxygen  Post-op Assessment: Report given to RN and Post -op Vital signs reviewed and stable  Post vital signs: Reviewed and stable  Last Vitals:  Vitals Value Taken Time  BP 134/84 04/21/22 1704  Temp    Pulse 61 04/21/22 1708  Resp 19 04/21/22 1708  SpO2 100 % 04/21/22 1708  Vitals shown include unvalidated device data.  Last Pain:  Vitals:   04/21/22 1239  TempSrc:   PainSc: 3       Patients Stated Pain Goal: 2 (04/21/22 1239)  Complications: No notable events documented.

## 2022-04-21 NOTE — Anesthesia Procedure Notes (Signed)
Procedure Name: Intubation Date/Time: 04/21/2022 2:32 PM  Performed by: Randon Goldsmith, CRNAPre-anesthesia Checklist: Patient identified, Emergency Drugs available, Suction available and Patient being monitored Patient Re-evaluated:Patient Re-evaluated prior to induction Oxygen Delivery Method: Circle system utilized Preoxygenation: Pre-oxygenation with 100% oxygen Induction Type: IV induction Ventilation: Mask ventilation without difficulty and Oral airway inserted - appropriate to patient size Laryngoscope Size: Mac and 4 Grade View: Grade I Tube type: Oral Tube size: 7.5 mm Number of attempts: 1 Airway Equipment and Method: Stylet and Oral airway Placement Confirmation: ETT inserted through vocal cords under direct vision, positive ETCO2 and breath sounds checked- equal and bilateral Secured at: 24 cm Tube secured with: Tape Dental Injury: Teeth and Oropharynx as per pre-operative assessment

## 2022-04-21 NOTE — Anesthesia Postprocedure Evaluation (Signed)
Anesthesia Post Note  Patient: Ean Gettel  Procedure(s) Performed: LUMBAR TWO-THREE, LUMBAR THREE-FOUR BILATERAL LAMINECTOMY (Bilateral: Back)     Patient location during evaluation: PACU Anesthesia Type: General Level of consciousness: awake and alert Pain management: pain level controlled Vital Signs Assessment: post-procedure vital signs reviewed and stable Respiratory status: spontaneous breathing, nonlabored ventilation, respiratory function stable and patient connected to nasal cannula oxygen Cardiovascular status: blood pressure returned to baseline and stable Postop Assessment: no apparent nausea or vomiting Anesthetic complications: no   No notable events documented.  Last Vitals:  Vitals:   04/21/22 1715 04/21/22 1730  BP: 126/79 133/83  Pulse: (!) 58 (!) 57  Resp: 18 16  Temp:    SpO2: 100% 98%    Last Pain:  Vitals:   04/21/22 1730  TempSrc:   PainSc: Asleep                 Collene Schlichter

## 2022-04-21 NOTE — H&P (Signed)
Charles Navarro is an 72 y.o. male.   Chief Complaint: Back and bilateral hip and leg pain HPI: 71 year old gentleman history of back and bilateral hip and leg pain in addition previous history of L4-5 decompression subsequent infection that is healed up workup is revealed severe spinal stenosis primarily at L3-4 but also at L2-3 above his old surgery and due to his progressive clinical syndrome failed conservative treatment imaging I recommended decompressive laminectomy at those 2 levels.  I extensively went over the risks and benefits of the operation with him as well as perioperative course expectations of outcome and alternatives to surgery and he understood and agreed to proceed forward.  Past Medical History:  Diagnosis Date   Bronchitis    hx of   COPD (chronic obstructive pulmonary disease)    Depression    GERD (gastroesophageal reflux disease)    Hypertension    takes meds daily   Sleep apnea    Vertigo     Past Surgical History:  Procedure Laterality Date   COLONOSCOPY     LUMBAR LAMINECTOMY/DECOMPRESSION MICRODISCECTOMY  11/08/2011   Procedure: LUMBAR LAMINECTOMY/DECOMPRESSION MICRODISCECTOMY 1 LEVEL;  Surgeon: Mariam Dollar, MD;  Location: [facility];  Service: Neurosurgery;  Laterality: Left;  left lumbar four-five decompression laminectomy microdiscectomy   WISDOM TOOTH EXTRACTION      Family History  Problem Relation Age of Onset   Alcoholism Father    Social History:  reports that he has never smoked. He has never used smokeless tobacco. He reports that he does not drink alcohol and does not use drugs.  Allergies:  Allergies  Allergen Reactions   Levaquin [Levofloxacin] Anaphylaxis    Shut down kidneys, numbness in toes    Codeine Nausea And Vomiting    dizziness   Gabapentin     Leg cramps   Oxycodone-Acetaminophen      Dizziness, GI Intolerance   Tramadol Nausea And Vomiting   Bactrim [Sulfamethoxazole-Trimethoprim] Itching and Rash    Medications Prior to  Admission  Medication Sig Dispense Refill   acetaminophen (TYLENOL) 325 MG tablet Take 650 mg by mouth every 6 (six) hours as needed for moderate pain or headache.     aspirin EC 81 MG tablet Take 1 tablet (81 mg total) by mouth daily. Swallow whole. 90 tablet 3   CAPSAICIN EX Apply 1 Application topically daily as needed (back pain).     cyclobenzaprine (FLEXERIL) 10 MG tablet Take 10 mg by mouth daily as needed for muscle spasms.     lisinopril-hydrochlorothiazide (ZESTORETIC) 10-12.5 MG tablet Take 1 tablet by mouth daily.     loratadine (CLARITIN) 10 MG tablet Take 10 mg by mouth daily as needed for allergies.     meclizine (ANTIVERT) 25 MG tablet Take 25 mg by mouth 3 (three) times daily as needed for dizziness. 1 - 3 tablets as needed for dizziness     Menthol-Camphor (TIGER BALM ARTHRITIS RUB EX) Apply 1 Application topically daily as needed (pain).     Multiple Vitamin (MULTIVITAMIN WITH MINERALS) TABS Take 1 tablet by mouth daily.     Omega-3 Fatty Acids (FISH OIL) 1000 MG CAPS Take 1,000 mg by mouth daily.     pantoprazole (PROTONIX) 40 MG tablet Take 40 mg by mouth daily as needed (heartburn).     Psyllium (METAMUCIL 4 IN 1 FIBER) 43 % POWD Take 1 Dose by mouth 4 (four) times a week. 1 dose = 2 teaspoons      Results for orders placed  or performed during the hospital encounter of 04/21/22 (from the past 48 hour(s))  CBC per protocol     Status: Abnormal   Collection Time: 04/21/22 12:50 PM  Result Value Ref Range   WBC 7.0 4.0 - 10.5 K/uL   RBC 4.90 4.22 - 5.81 MIL/uL   Hemoglobin 15.6 13.0 - 17.0 g/dL   HCT 54.0 98.1 - 19.1 %   MCV 88.2 80.0 - 100.0 fL   MCH 31.8 26.0 - 34.0 pg   MCHC 36.1 (H) 30.0 - 36.0 g/dL   RDW 47.8 29.5 - 62.1 %   Platelets 220 150 - 400 K/uL   nRBC 0.0 0.0 - 0.2 %    Comment: Performed at [facility], [address], [place], [place] [address]   No results found.  Review of Systems  Musculoskeletal:  Positive for back pain.  Neurological:   Positive for numbness.    Blood pressure 127/81, pulse 71, temperature 97.9 F (36.6 C), temperature source Oral, resp. rate 17, height  (1.981 m), weight 122.9 kg, SpO2 98 %. Physical Exam HENT:     Head: Normocephalic.     Right Ear: Tympanic membrane normal.     Nose: Nose normal.     Mouth/Throat:     Mouth: Mucous membranes are moist.  Eyes:     Pupils: Pupils are equal, round, and reactive to light.  Cardiovascular:     Rate and Rhythm: Normal rate.     Pulses: Normal pulses.  Pulmonary:     Effort: Pulmonary effort is normal.  Abdominal:     General: Abdomen is flat.  Musculoskeletal:        General: Normal range of motion.  Skin:    General: Skin is warm.  Neurological:     General: No focal deficit present.     Mental Status: He is alert.     Comments: Strength is 5 out of 5 iliopsoas, quads, hamstrings, gastroc, into tibialis, and EHL.      Assessment/Plan 72 year old presents for decompressive laminectomy L2-3 L3-4  Mariam Dollar, MD 04/21/2022, 1:50 PM

## 2022-04-21 NOTE — Op Note (Signed)
Preoperative diagnosis: Lumbar spinal stenosis bilateral L2-L3-L4 radiculopathies.  Postoperative diagnosis: Same.  Procedure: Decompressive lumbar laminectomy L2-3 and L3-4 with partial medial facetectomies and foraminotomies of the L2, L3, and L4 nerve roots bilaterally..  Surgeon: Donalee Citrin.  Assistant: Coletta Memos.  Anesthesia: General.  EBL: Minimal.  HPI: 72 year old gentleman with back bilateral hip and leg pain mostly in the L3-L4 nerve root pattern workup revealed severe spinal stenosis at L2-3 and L3-4.  Due to patient's progression of clinical syndrome imaging findings of a conservative treatment I recommended decompressive laminectomies at those 2 levels with foraminotomies and partial facetectomies.  I extensively reviewed the risks and benefits of the operation with him as well as perioperative course expectations of outcome and alternatives of surgery and he understood and agreed to proceed forward.  Operative procedure: Patient was brought into the OR was induced under general anesthesia positioned prone the Wilson frame his back was prepped and draped in routine sterile fashion.  His old incision was identified and extended cephalad and so after infiltration of 10 cc lidocaine with epi midline incision was made and Bovie that cautery was used to take take down the subcutaneous tissue and subperiosteal dissection was carried lamina of L2-L3 and L4 bilaterally.  Interoperative x-ray confirmed application appropriate level so then spinous process at L3 was removed in its entirety the inferior aspect the spinous process of L2 and superior aspect the spinous process at L4 was all removed the lamina was drilled down central decompression was begun at 3 4 and 2 3 there was marked hourglass compression with severe thecal sac compression and foraminal and foraminal stenosis this was all teased off of the dura and under biting the medial gutter in piecemeal fashion with performing  foraminotomies bilaterally at L2, L3, and L4.  At the end of decompression there was no further stenosis explore the disc bases and felt to be mostly spondylitic and not significantly bulging or herniating so with all the foramina easily excepting a coronary dilator without resistance I elected not to enter the disc base.  After meticulous hemostasis was maintained I placed a medium Hemovac drain Gelfoam was overlaid top of the dura the muscle and fascia approximate layers with active Vicryl skin was closed running 4 subcuticular Dermabond benzoin Steri-Strips and a sterile dressing was applied patient recovery in stable condition.  At the end the case all needle counts and sponge counts were correct.

## 2022-04-22 ENCOUNTER — Encounter (HOSPITAL_COMMUNITY): Payer: Self-pay | Admitting: Neurosurgery

## 2022-04-22 DIAGNOSIS — J449 Chronic obstructive pulmonary disease, unspecified: Secondary | ICD-10-CM | POA: Diagnosis not present

## 2022-04-22 DIAGNOSIS — G4733 Obstructive sleep apnea (adult) (pediatric): Secondary | ICD-10-CM | POA: Diagnosis not present

## 2022-04-22 DIAGNOSIS — M5416 Radiculopathy, lumbar region: Secondary | ICD-10-CM | POA: Diagnosis not present

## 2022-04-22 DIAGNOSIS — K219 Gastro-esophageal reflux disease without esophagitis: Secondary | ICD-10-CM | POA: Diagnosis not present

## 2022-04-22 DIAGNOSIS — I10 Essential (primary) hypertension: Secondary | ICD-10-CM | POA: Diagnosis not present

## 2022-04-22 DIAGNOSIS — M48061 Spinal stenosis, lumbar region without neurogenic claudication: Secondary | ICD-10-CM | POA: Diagnosis not present

## 2022-04-22 MED ORDER — HYDROCODONE-ACETAMINOPHEN 5-325 MG PO TABS: 2.0000 | ORAL_TABLET | ORAL | 0 refills | Status: DC | PRN

## 2022-04-22 MED FILL — Thrombin For Soln 5000 Unit: CUTANEOUS | Qty: 2 | Status: AC

## 2022-04-22 NOTE — Discharge Summary (Signed)
  Physician Discharge Summary  Patient ID: Charles Navarro MRN: [id number] DOB/AGE: 1950-08-28 72 y.o. Estimated body mass index is 31.32 kg/m as calculated from the following:   Height as of this encounter:  (1.981 m).   Weight as of this encounter: 122.9 kg.   Admit date: 04/21/2022 Discharge date: 04/22/2022  Admission Diagnoses: Lumbar spinal stenosis L2-3 L3-4  Discharge Diagnoses: Same Principal Problem:   Spinal stenosis of lumbar region   Discharged Condition: good  Hospital Course: Patient admitted to the hospital underwent decompressive laminectomy L2-3 L3-4.  Postoperative patient did very well come to the floor on the floor patient was ambulating and voiding spontaneously tolerating regular diet and stable for discharge home.  Patient be discharged with scheduled follow-up in 1 to 2 weeks.  Consults: Significant Diagnostic Studies: Treatments: Decompressive laminectomy L2-3 L3-4 Discharge Exam: Blood pressure 109/62, pulse 64, temperature 98.5 F (36.9 C), resp. rate 18, height  (1.981 m), weight 122.9 kg, SpO2 98 %. Strength 5 out of 5 and clean dry and intact  Disposition: Home   Allergies as of 04/22/2022       Reactions   Levaquin [levofloxacin] Anaphylaxis   Shut down kidneys, numbness in toes    Codeine Nausea And Vomiting   dizziness   Gabapentin    Leg cramps   Oxycodone-acetaminophen     Dizziness, GI Intolerance   Tramadol Nausea And Vomiting   Bactrim [sulfamethoxazole-trimethoprim] Itching, Rash        Medication List     TAKE these medications    acetaminophen 325 MG tablet Commonly known as: TYLENOL Take 650 mg by mouth every 6 (six) hours as needed for moderate pain or headache.   aspirin EC 81 MG tablet Take 1 tablet (81 mg total) by mouth daily. Swallow whole.   CAPSAICIN EX Apply 1 Application topically daily as needed (back pain).   cyclobenzaprine 10 MG tablet Commonly known as: FLEXERIL Take 10 mg by mouth daily  as needed for muscle spasms.   Fish Oil 1000 MG Caps Take 1,000 mg by mouth daily.   HYDROcodone-acetaminophen 5-325 MG tablet Commonly known as: NORCO/VICODIN Take 2 tablets by mouth every 4 (four) hours as needed for severe pain ((score 7 to 10)).   lisinopril-hydrochlorothiazide 10-12.5 MG tablet Commonly known as: ZESTORETIC Take 1 tablet by mouth daily.   loratadine 10 MG tablet Commonly known as: CLARITIN Take 10 mg by mouth daily as needed for allergies.   meclizine 25 MG tablet Commonly known as: ANTIVERT Take 25 mg by mouth 3 (three) times daily as needed for dizziness. 1 - 3 tablets as needed for dizziness   Metamucil 4 in 1 Fiber 43 % Powd Generic drug: Psyllium Take 1 Dose by mouth 4 (four) times a week. 1 dose = 2 teaspoons   multivitamin with minerals Tabs tablet Take 1 tablet by mouth daily.   pantoprazole 40 MG tablet Commonly known as: PROTONIX Take 40 mg by mouth daily as needed (heartburn).   TIGER BALM ARTHRITIS RUB EX Apply 1 Application topically daily as needed (pain).         Signed: Mariam Dollar 04/22/2022, 8:03 AM

## 2022-04-22 NOTE — Evaluation (Addendum)
Occupational Therapy Evaluation Patient Details Name: Charles Navarro MRN: [id number] DOB: 1950-05-04 Today's Date: 04/22/2022   History of Present Illness 72 year old man with back bilateral hip and leg pain mostly in the L3-L4 nerve root pattern workup revealed severe spinal stenosis at L2-3 and L3-4. He is now s/p bilateral lumbar laminectomy L2-L3 and L3-L4 (04/21/22).   Clinical Impression   Pt admitted for above dx, PTA patient lived with spouse and sister in law, reports being independent in bADLs/iADLs and functional ambulation no AD. Educated patient on POB precautions and patient verbalized/demonstrated understanding by completing transfers, stairs, and dressing while maintaining precautions. After therapy session patient was seen attempting to don underwear while standing against wall. Pt will be kept on acute OT caseload in the event that DC plans change to reinforce precautions as patient continues to complete habitual ways of dressing when no supervised. Discussed with patient the need to be mindful of precautions and heed the strategies used during therapy, pt in agreeance. Pt would benefit from tub seat to maintain precautions while bathing, no follow-up therapy recommended at this time.      Recommendations for follow up therapy are one component of a multi-disciplinary discharge planning process, led by the attending physician.  Recommendations may be updated based on patient status, additional functional criteria and insurance authorization.   Assistance Recommended at Discharge Intermittent Supervision/Assistance  Patient can return home with the following A little help with bathing/dressing/bathroom;Assistance with cooking/housework;Assist for transportation    Functional Status Assessment  Patient has had a recent decline in their functional status and demonstrates the ability to make significant improvements in function in a reasonable and predictable amount of time.  Equipment  Recommendations  Tub/shower seat    Recommendations for Other Services       Precautions / Restrictions Precautions Precautions: Back;Fall Precaution Booklet Issued: Yes (comment) Precaution Comments: No BLT Restrictions Weight Bearing Restrictions: No      Mobility Bed Mobility Overal bed mobility: Modified Independent             General bed mobility comments: Pt demo'd good use of log rolling d/t prior knowledge    Transfers Overall transfer level: Modified independent                 General transfer comment: slower to rise in upright position      Balance Overall balance assessment: Modified Independent                                         ADL either performed or assessed with clinical judgement   ADL Overall ADL's : Needs assistance/impaired Eating/Feeding: Independent;Sitting   Grooming: Standing;Supervision/safety   Upper Body Bathing: Standing;Supervision/ safety   Lower Body Bathing: Supervison/ safety;Sitting/lateral leans   Upper Body Dressing : Standing;Supervision/safety Upper Body Dressing Details (indicate cue type and reason): Pt donned/doffed gown in jacket like fashion Lower Body Dressing: Sitting/lateral leans;Min guard Lower Body Dressing Details (indicate cue type and reason): Pt donned/doffed bilat socks using fig 4 technique Toilet Transfer: Ambulation;Supervision/safety;Regular Teacher, adult education Details (indicate cue type and reason): use of rails Toileting- Clothing Manipulation and Hygiene: Supervision/safety;Sit to/from stand   Tub/ Shower Transfer: Min guard   Functional mobility during ADLs: Supervision/safety       Vision         Perception     Praxis      Pertinent Vitals/Pain Pain Assessment  Pain Assessment: No/denies pain     Hand Dominance     Extremity/Trunk Assessment Upper Extremity Assessment Upper Extremity Assessment: Overall WFL for tasks assessed   Lower  Extremity Assessment Lower Extremity Assessment: Overall WFL for tasks assessed;LLE deficits/detail LLE Deficits / Details: LLE has decreased hip flexion, able to acheive fig four but much more challenging than RLE. Sensation and strength intact   Cervical / Trunk Assessment Cervical / Trunk Assessment: Back Surgery   Communication Communication Communication: No difficulties   Cognition Arousal/Alertness: Awake/alert Behavior During Therapy: WFL for tasks assessed/performed Overall Cognitive Status: Within Functional Limits for tasks assessed                                       General Comments  VSS on RA,    Exercises     Shoulder Instructions      Home Living Family/patient expects to be discharged to:: Private residence Living Arrangements: Spouse/significant other Available Help at Discharge: Family;Available 24 hours/day Type of Home: Apartment (Pt owns 2 apartments but will be living on bottom floor apartment) Home Access: Level entry Entrance Stairs-Number of Steps: his other apartment is on the 2nd level with 1 flight and bilat rails   Home Layout: One level     Bathroom Shower/Tub: Chief Strategy Officer: Handicapped height     Home Equipment: Cane - single point          Prior Functioning/Environment Prior Level of Function : Independent/Modified Independent;Driving             Mobility Comments: Pt reports being active and independent no AD, but had a SPC in room ADLs Comments: Pt reports being independent in bADLs/iADLs        OT Problem List: Pain      OT Treatment/Interventions: Self-care/ADL training;Energy conservation;Balance training;Therapeutic exercise;DME and/or AE instruction;Patient/family education;Therapeutic activities    OT Goals(Current goals can be found in the care plan section) Acute Rehab OT Goals Patient Stated Goal: To go home OT Goal Formulation: With patient Time For Goal Achievement:  05/06/22 Potential to Achieve Goals: Good ADL Goals Additional ADL Goal #1: Pt will complete all bADLs while maintaining precautions, and recall 3/3 precautions  OT Frequency: Min 2X/week    Co-evaluation              AM-PAC OT "6 Clicks" Daily Activity     Outcome Measure Help from another person eating meals?: None Help from another person taking care of personal grooming?: None Help from another person toileting, which includes using toliet, bedpan, or urinal?: A Little Help from another person bathing (including washing, rinsing, drying)?: A Little Help from another person to put on and taking off regular upper body clothing?: None Help from another person to put on and taking off regular lower body clothing?: A Little 6 Click Score: 21   End of Session Equipment Utilized During Treatment: Gait belt Nurse Communication: Mobility status  Activity Tolerance: Patient tolerated treatment well Patient left: in bed;with call bell/phone within reach  OT Visit Diagnosis: Other abnormalities of gait and mobility (R26.89);Unsteadiness on feet (R26.81);Pain Pain - part of body:  (back)                Time: 1610-9604 OT Time Calculation (min): 35 min Charges:  OT General Charges $OT Visit: 1 Visit OT Evaluation $OT Eval Moderate Complexity: 1 Mod OT Treatments $Therapeutic Activity:  8-22 mins  04/22/2022  AB, OTR/L  Acute Rehabilitation Services  Office: [phone]   Tristan Schroeder 04/22/2022, 9:21 AM

## 2022-04-22 NOTE — Progress Notes (Signed)
Patient alert and oriented, mae's well, voiding adequate amount of urine, swallowing without difficulty, no c/o pain at time of discharge. Patient discharged home with family. Script and discharged instructions given to patient. Patient and family stated understanding of instructions given. Patient has an appointment with Dr. Cram 

## 2022-04-22 NOTE — Discharge Instructions (Signed)
.  Wound Care  Keep the incision clean and dry remove the outer dressing in 3 days, leave the Steri-Strips intact. Wrap with Saran wrap for showers only Do not put any creams, lotions, or ointments on incision. Leave steri-strips on back.  They will fall off by themselves.  Activity Walk each and every day, increasing distance each day. No lifting greater than 5 lbs.  No lifting no bending no twisting no driving or riding a car unless coming back and forth to see me.  Diet Resume your normal diet.     Call Your Doctor If Any of These Occur Redness, drainage, or swelling at the wound.  Temperature greater than 101 degrees. Severe pain not relieved by pain medication. Incision starts to come apart. Follow Up Appt Call [phone])  for problems.  If you have any hardware placed in your spine, you will need an x-ray before your appointment.   

## 2022-04-23 ENCOUNTER — Other Ambulatory Visit: Payer: Self-pay | Admitting: Internal Medicine

## 2022-04-27 ENCOUNTER — Ambulatory Visit (HOSPITAL_COMMUNITY): Payer: Medicare Other | Attending: Internal Medicine

## 2022-04-27 DIAGNOSIS — I318 Other specified diseases of pericardium: Secondary | ICD-10-CM | POA: Diagnosis not present

## 2022-04-27 DIAGNOSIS — I77819 Aortic ectasia, unspecified site: Secondary | ICD-10-CM | POA: Diagnosis not present

## 2022-04-27 LAB — ECHOCARDIOGRAM COMPLETE
Area-P 1/2: 2.21 cm2
S' Lateral: 2.3 cm

## 2022-05-14 ENCOUNTER — Telehealth: Payer: Self-pay

## 2022-05-14 DIAGNOSIS — I77819 Aortic ectasia, unspecified site: Secondary | ICD-10-CM

## 2022-05-14 DIAGNOSIS — I422 Other hypertrophic cardiomyopathy: Secondary | ICD-10-CM

## 2022-05-14 NOTE — Telephone Encounter (Signed)
-----   Message from Christell Constant, MD sent at 05/02/2022  1:09 PM EDT ----- Results: Apical hypertrophy with small thinning of the apical myocardium Normal function Mild aortic dilation No thrombus No evidence of constrictive pericarditis Plan: CMR in three months  Christell Constant, MD

## 2022-05-14 NOTE — Telephone Encounter (Signed)
Left a message to call back.  Instruction letter for Cmri pending.

## 2022-05-17 NOTE — Telephone Encounter (Signed)
-----   Message from Mahesh A Chandrasekhar, MD sent at 05/02/2022  1:09 PM EDT ----- Results: Apical hypertrophy with small thinning of the apical myocardium Normal function Mild aortic dilation No thrombus No evidence of constrictive pericarditis Plan: CMR in three months  Mahesh A Chandrasekhar, MD  

## 2022-05-17 NOTE — Telephone Encounter (Signed)
The patient has been notified of the result and verbalized understanding.  All questions (if any) were answered. Macie Burows, RN 05/17/2022 8:24 AM   Reviewed Cmri instructions with pt and placed on my chart for pt to review.  Reviewed steps on how to view letter on my chart.

## 2022-05-27 ENCOUNTER — Ambulatory Visit (INDEPENDENT_AMBULATORY_CARE_PROVIDER_SITE_OTHER): Payer: Medicare Other | Admitting: Family

## 2022-05-27 ENCOUNTER — Ambulatory Visit: Payer: Medicare Other | Admitting: Family

## 2022-05-27 ENCOUNTER — Encounter: Payer: Self-pay | Admitting: Family

## 2022-05-27 VITALS — BP 142/80 | HR 64 | Ht 78.0 in | Wt 282.6 lb

## 2022-05-27 DIAGNOSIS — T753XXS Motion sickness, sequela: Secondary | ICD-10-CM

## 2022-05-27 DIAGNOSIS — T753XXA Motion sickness, initial encounter: Secondary | ICD-10-CM

## 2022-05-27 NOTE — Progress Notes (Signed)
Charles Navarro is a 72 y.o. male with the following history as recorded in [employer]:  Patient Active Problem List   Diagnosis Date Noted   Spinal stenosis of lumbar region 04/21/2022   Pneumonia due to COVID-19 virus 02/08/2019   OSA (obstructive sleep apnea) 02/08/2019   Boil 03/06/2012   Discitis of lumbar region 01/31/2012   Diarrhea 01/31/2012   Nausea 01/31/2012   Acid reflux 01/31/2012   Weight loss, abnormal 01/31/2012    Current Outpatient Medications  Medication Sig Dispense Refill   acetaminophen (TYLENOL) 325 MG tablet Take 650 mg by mouth every 6 (six) hours as needed for moderate pain or headache.     aspirin EC 81 MG tablet Take 1 tablet (81 mg total) by mouth daily. Swallow whole. 90 tablet 3   CAPSAICIN EX Apply 1 Application topically daily as needed (back pain).     cyclobenzaprine (FLEXERIL) 10 MG tablet Take 10 mg by mouth daily as needed for muscle spasms.     HYDROcodone-acetaminophen (NORCO/VICODIN) 5-325 MG tablet Take 2 tablets by mouth every 4 (four) hours as needed for severe pain ((score 7 to 10)). 30 tablet 0   lisinopril-hydrochlorothiazide (ZESTORETIC) 10-12.5 MG tablet Take 1 tablet by mouth daily.     loratadine (CLARITIN) 10 MG tablet Take 10 mg by mouth daily as needed for allergies.     meclizine (ANTIVERT) 25 MG tablet Take 25 mg by mouth 3 (three) times daily as needed for dizziness. 1 - 3 tablets as needed for dizziness     Menthol-Camphor (TIGER BALM ARTHRITIS RUB EX) Apply 1 Application topically daily as needed (pain).     Multiple Vitamin (MULTIVITAMIN WITH MINERALS) TABS Take 1 tablet by mouth daily.     Omega-3 Fatty Acids (FISH OIL) 1000 MG CAPS Take 1,000 mg by mouth daily.     pantoprazole (PROTONIX) 40 MG tablet Take 40 mg by mouth daily as needed (heartburn).     Psyllium (METAMUCIL 4 IN 1 FIBER) 43 % POWD Take 1 Dose by mouth 4 (four) times a week. 1 dose = 2 teaspoons     No current facility-administered medications for this visit.     Allergies: Levaquin [levofloxacin], Codeine, Gabapentin, Oxycodone-acetaminophen, Tramadol, and Bactrim [sulfamethoxazole-trimethoprim]  Past Medical History:  Diagnosis Date   Bronchitis    hx of   COPD (chronic obstructive pulmonary disease) (HCC)    Depression    GERD (gastroesophageal reflux disease)    Hypertension    takes meds daily   Sleep apnea    Vertigo     Past Surgical History:  Procedure Laterality Date   COLONOSCOPY     LUMBAR LAMINECTOMY/DECOMPRESSION MICRODISCECTOMY  11/08/2011   Procedure: LUMBAR LAMINECTOMY/DECOMPRESSION MICRODISCECTOMY 1 LEVEL;  Surgeon: Mariam Dollar, MD;  Location: [facility];  Service: Neurosurgery;  Laterality: Left;  left lumbar four-five decompression laminectomy microdiscectomy   LUMBAR LAMINECTOMY/DECOMPRESSION MICRODISCECTOMY Bilateral 04/21/2022   Procedure: LUMBAR TWO-THREE, LUMBAR THREE-FOUR BILATERAL LAMINECTOMY;  Surgeon: Donalee Citrin, MD;  Location: [facility] OR;  Service: Neurosurgery;  Laterality: Bilateral;  3C   WISDOM TOOTH EXTRACTION      Family History  Problem Relation Age of Onset   Alcoholism Father     Social History   Tobacco Use   Smoking status: Never   Smokeless tobacco: Never  Substance Use Topics   Alcohol use: No    Subjective:   Patient has been seen by our office one time; he indicated at that appointment that he preferred to have his  primary care needs managed by his PCP at the [place]; Today he is asking if our office can help complete paperwork regarding his [facility] records from 2010-2016; he has a patient advocate that is working with him; he has not spoken to his [place] provider regarding this request;   Objective:  Vitals:   05/27/22 0935  BP: (!) 142/80  Pulse: 64  SpO2: 99%  Weight: 282 lb 9.6 oz (128.2 kg)  Height: 6\' 6"  (1.981 m)    General: Well developed, well nourished, in no acute distress  Skin : Warm and dry.  Head: Normocephalic and atraumatic  Lungs: Respirations unlabored;  Neurologic: Alert and  oriented; speech intact; face symmetrical; moves all extremities well; CNII-XII intact without focal deficit   Assessment:  1. Motion sickness, sequela     Plan:  Per patient, he has struggled with chronic motion sickness for years and was ultimately discharged from military/ given disability for this need; explained to patient however that I cannot use records from 2010 before he was my patient to complete medical forms for the [place]. Since he has been under their care during this time and continues to have them manage his primary care needs, he will need to discuss further with PCP at the [place];   No follow-ups on file.  No orders of the defined types were placed in this encounter.   Requested Prescriptions    No prescriptions requested or ordered in this encounter

## 2022-06-14 ENCOUNTER — Ambulatory Visit (INDEPENDENT_AMBULATORY_CARE_PROVIDER_SITE_OTHER): Payer: Medicare Other | Admitting: *Deleted

## 2022-06-14 DIAGNOSIS — Z0000 Encounter for general adult medical examination without abnormal findings: Secondary | ICD-10-CM | POA: Diagnosis not present

## 2022-06-14 NOTE — Progress Notes (Signed)
Subjective:   Charles Navarro is a 72 y.o. male who presents for an Initial Medicare Annual Wellness Visit.  I connected with  [name] on 06/14/22 by a audio enabled telemedicine application and verified that I am speaking with the correct person using two identifiers.  Patient Location: Home  Provider Location: Office/Clinic  I discussed the limitations of evaluation and management by telemedicine. The patient expressed understanding and agreed to proceed.   Review of Systems     Cardiac Risk Factors include: advanced age (>22men, >52 women);male gender;hypertension     Objective:    There were no vitals filed for this visit. There is no height or weight on file to calculate BMI.     06/14/2022    2:10 PM 01/24/2012    9:04 PM 01/13/2012    6:00 AM 01/12/2012   10:50 PM 11/02/2011   12:12 PM 10/13/2011   11:24 AM  Advanced Directives  Does Patient Have a Medical Advance Directive? No Patient does not have advance directive Patient would not like information Patient would not like information Patient does not have advance directive;Patient would not like information Patient does not have advance directive;Patient would not like information  Would patient like information on creating a medical advance directive? No - Patient declined       Pre-existing out of facility DNR order (yellow form or pink MOST form)  No No No  No    Current Medications (verified) Outpatient Encounter Medications as of 06/14/2022  Medication Sig   acetaminophen (TYLENOL) 325 MG tablet Take 650 mg by mouth every 6 (six) hours as needed for moderate pain or headache.   aspirin EC 81 MG tablet Take 1 tablet (81 mg total) by mouth daily. Swallow whole.   CAPSAICIN EX Apply 1 Application topically daily as needed (back pain).   cyclobenzaprine (FLEXERIL) 10 MG tablet Take 10 mg by mouth daily as needed for muscle spasms.   HYDROcodone-acetaminophen (NORCO/VICODIN) 5-325 MG tablet Take 2 tablets by mouth every  4 (four) hours as needed for severe pain ((score 7 to 10)).   lisinopril-hydrochlorothiazide (ZESTORETIC) 10-12.5 MG tablet Take 1 tablet by mouth daily.   loratadine (CLARITIN) 10 MG tablet Take 10 mg by mouth daily as needed for allergies.   meclizine (ANTIVERT) 25 MG tablet Take 25 mg by mouth 3 (three) times daily as needed for dizziness. 1 - 3 tablets as needed for dizziness   Menthol-Camphor (TIGER BALM ARTHRITIS RUB EX) Apply 1 Application topically daily as needed (pain).   Multiple Vitamin (MULTIVITAMIN WITH MINERALS) TABS Take 1 tablet by mouth daily.   Omega-3 Fatty Acids (FISH OIL) 1000 MG CAPS Take 1,000 mg by mouth daily.   pantoprazole (PROTONIX) 40 MG tablet Take 40 mg by mouth daily.   Psyllium (METAMUCIL 4 IN 1 FIBER) 43 % POWD Take 1 Dose by mouth 4 (four) times a week. 1 dose = 2 teaspoons   No facility-administered encounter medications on file as of 06/14/2022.    Allergies (verified) Levaquin [levofloxacin], Codeine, Gabapentin, Oxycodone-acetaminophen, Tramadol, and Bactrim [sulfamethoxazole-trimethoprim]   History: Past Medical History:  Diagnosis Date   Bronchitis    hx of   COPD (chronic obstructive pulmonary disease) (HCC)    Depression    GERD (gastroesophageal reflux disease)    Hypertension    takes meds daily   Sleep apnea    Vertigo    Past Surgical History:  Procedure Laterality Date   COLONOSCOPY     LUMBAR LAMINECTOMY/DECOMPRESSION MICRODISCECTOMY  11/08/2011   Procedure: LUMBAR LAMINECTOMY/DECOMPRESSION MICRODISCECTOMY 1 LEVEL;  Surgeon: Mariam Dollar, MD;  Location: [facility];  Service: Neurosurgery;  Laterality: Left;  left lumbar four-five decompression laminectomy microdiscectomy   LUMBAR LAMINECTOMY/DECOMPRESSION MICRODISCECTOMY Bilateral 04/21/2022   Procedure: LUMBAR TWO-THREE, LUMBAR THREE-FOUR BILATERAL LAMINECTOMY;  Surgeon: Donalee Citrin, MD;  Location: [facility] OR;  Service: Neurosurgery;  Laterality: Bilateral;  3C   WISDOM TOOTH EXTRACTION      Family History  Problem Relation Age of Onset   Alcoholism Father    Social History   Socioeconomic History   Marital status: Married    Spouse name: Not on file   Number of children: Not on file   Years of education: Not on file   Highest education level: Not on file  Occupational History   Not on file  Tobacco Use   Smoking status: Never   Smokeless tobacco: Never  Vaping Use   Vaping Use: Never used  Substance and Sexual Activity   Alcohol use: No   Drug use: No   Sexual activity: Not on file  Other Topics Concern   Not on file  Social History Narrative   Not on file   Social Determinants of Health   Financial Resource Strain: Low Risk  (06/14/2022)   Overall Financial Resource Strain (CARDIA)    Difficulty of Paying Living Expenses: Not very hard  Food Insecurity: No Food Insecurity (06/14/2022)   Hunger Vital Sign    Worried About Running Out of Food in the Last Year: Never true    Ran Out of Food in the Last Year: Never true  Transportation Needs: No Transportation Needs (06/14/2022)   PRAPARE - Administrator, Civil Service (Medical): No    Lack of Transportation (Non-Medical): No  Physical Activity: Inactive (06/14/2022)   Exercise Vital Sign    Days of Exercise per Week: 0 days    Minutes of Exercise per Session: 0 min  Stress: Stress Concern Present (06/14/2022)   [employer] of Occupational Health - Occupational Stress Questionnaire    Feeling of Stress : To some extent  Social Connections: Moderately Integrated (06/14/2022)   Social Connection and Isolation Panel [NHANES]    Frequency of Communication with Friends and Family: More than three times a week    Frequency of Social Gatherings with Friends and Family: More than three times a week    Attends Religious Services: More than 4 times per year    Active Member of [employer] or Organizations: No    Attends Engineer, structural: Never    Marital Status: Married    Tobacco  Counseling Counseling given: Not Answered   Clinical Intake:  Pre-visit preparation completed: Yes  Pain : No/denies pain  Nutritional Risks: None Diabetes: No  How often do you need to have someone help you when you read instructions, pamphlets, or other written materials from your doctor or pharmacy?: 1 - Never   Activities of Daily Living    06/14/2022    2:25 PM 04/21/2022   12:48 PM  In your present state of health, do you have any difficulty performing the following activities:  Hearing? 0   Vision? 0   Difficulty concentrating or making decisions? 0   Walking or climbing stairs? 1   Comment recent spinal surgery   Dressing or bathing? 0   Doing errands, shopping? 0 0  Preparing Food and eating ? N   Using the Toilet? N   In the past six  months, have you accidently leaked urine? N   Do you have problems with loss of bowel control? N   Managing your Medications? N   Managing your Finances? N   Housekeeping or managing your Housekeeping? N     Patient Care Team: Olive Bass, FNP as PCP - General (Internal Medicine) Christell Constant, MD as PCP - Cardiology (Cardiology) [place] Scrape Allyne Gee, FNP (Internal Medicine)  Indicate any recent Medical Services you may have received from other than [facility] providers in the past year (date may be approximate).     Assessment:   This is a routine wellness examination for [place].  Hearing/Vision screen No results found.  Dietary issues and exercise activities discussed: Current Exercise Habits: The patient does not participate in regular exercise at present, Exercise limited by: orthopedic condition(s)   Goals Addressed   None    Depression Screen    06/14/2022    2:23 PM 05/27/2022    9:41 AM 03/02/2022    1:33 PM 05/23/2012   10:27 AM  PHQ 2/9 Scores  PHQ - 2 Score 3 0 0 0    Fall Risk    06/14/2022    2:24 PM 03/02/2022    1:33 PM 05/23/2012   10:27 AM 01/31/2012    3:12 PM  Fall Risk    Falls in the past year? 0 0 No   Number falls in past yr: 0 0    Injury with Fall? 0 0    Risk for fall due to : No Fall Risks No Fall Risks Impaired balance/gait Impaired balance/gait  Risk for fall due to: Comment   uses cane using cane  Follow up Falls evaluation completed Falls evaluation completed      FALL RISK PREVENTION PERTAINING TO THE HOME:  Any stairs in or around the home? Yes  If so, are there any without handrails? No  Home free of loose throw rugs in walkways, pet beds, electrical cords, etc? Yes  Adequate lighting in your home to reduce risk of falls? Yes   ASSISTIVE DEVICES UTILIZED TO PREVENT FALLS:  Life alert? No  Use of a cane, walker or w/c? Yes  Grab bars in the bathroom? Yes  Shower chair or bench in shower? No  Elevated toilet seat or a handicapped toilet? No   TIMED UP AND GO:  Was the test performed?  No, audio visit .   Cognitive Function:        06/14/2022    2:27 PM  6CIT Screen  What Year? 0 points  What month? 0 points  What time? 0 points  Count back from 20 0 points  Months in reverse 0 points  Repeat phrase 0 points  Total Score 0 points    Immunizations Immunization History  Administered Date(s) Administered   Influenza, High Dose Seasonal PF 10/04/2016   Influenza-Unspecified 10/14/2018   Pneumococcal Conjugate-13 08/20/2014   Pneumococcal Polysaccharide-23 08/02/2016   Tdap 08/20/2011   Zoster, Live 10/29/2013    TDAP status: Due, Education has been provided regarding the importance of this vaccine. Advised may receive this vaccine at local pharmacy or Health Dept. Aware to provide a copy of the vaccination record if obtained from local pharmacy or Health Dept. Verbalized acceptance and understanding.  Flu Vaccine status: Up to date  Pneumococcal vaccine status: Up to date  Covid-19 vaccine status: Information provided on how to obtain vaccines.   Qualifies for Shingles Vaccine? Yes   Zostavax completed No    Shingrix  Completed?: No.    Education has been provided regarding the importance of this vaccine. Patient has been advised to call insurance company to determine out of pocket expense if they have not yet received this vaccine. Advised may also receive vaccine at local pharmacy or Health Dept. Verbalized acceptance and understanding.  Screening Tests Health Maintenance  Topic Date Due   Medicare Annual Wellness (AWV)  Never done   COVID-19 Vaccine (1) Never done   Colonoscopy  Never done   Zoster Vaccines- Shingrix (1 of 2) Never done   DTaP/Tdap/Td (2 - Td or Tdap) 08/19/2021   INFLUENZA VACCINE  08/05/2022   Pneumonia Vaccine 74+ Years old  Completed   Hepatitis C Screening  Completed   HPV VACCINES  Aged Out    Health Maintenance  Health Maintenance Due  Topic Date Due   Medicare Annual Wellness (AWV)  Never done   COVID-19 Vaccine (1) Never done   Colonoscopy  Never done   Zoster Vaccines- Shingrix (1 of 2) Never done   DTaP/Tdap/Td (2 - Td or Tdap) 08/19/2021    Colorectal cancer screening: Type of screening: Colonoscopy. Completed 07/10/18. Repeat every 10 years  Lung Cancer Screening: (Low Dose CT Chest recommended if Age 29-80 years, 30 pack-year currently smoking OR have quit w/in 15years.) does not qualify.   Additional Screening:  Hepatitis C Screening: does qualify; Completed 01/17/12  Vision Screening: Recommended annual ophthalmology exams for early detection of glaucoma and other disorders of the eye. Is the patient up to date with their annual eye exam?  Yes  Who is the provider or what is the name of the office in which the patient attends annual eye exams? [facility] If pt is not established with a provider, would they like to be referred to a provider to establish care? No .   Dental Screening: Recommended annual dental exams for proper oral hygiene  Community Resource Referral / Chronic Care Management: CRR required this visit?  No   CCM required  this visit?  No      Plan:     I have personally reviewed and noted the following in the patient's chart:   Medical and social history Use of alcohol, tobacco or illicit drugs  Current medications and supplements including opioid prescriptions. Patient is currently taking opioid prescriptions. Information provided to patient regarding non-opioid alternatives. Patient advised to discuss non-opioid treatment plan with their provider. Functional ability and status Nutritional status Physical activity Advanced directives List of other physicians Hospitalizations, surgeries, and ER visits in previous 12 months Vitals Screenings to include cognitive, depression, and falls Referrals and appointments  In addition, I have reviewed and discussed with patient certain preventive protocols, quality metrics, and best practice recommendations. A written personalized care plan for preventive services as well as general preventive health recommendations were provided to patient.   Due to this being a telephonic visit, the after visit summary with patients personalized plan was offered to patient via mail or my-chart. Patient would like to access on my-chart.   Donne Anon, [place]   06/14/2022   Nurse Notes: None

## 2022-06-14 NOTE — Patient Instructions (Signed)
Charles Navarro , Thank you for taking time to come for your Medicare Wellness Visit. I appreciate your ongoing commitment to your health goals. Please review the following plan we discussed and let me know if I can assist you in the future.   These are the goals we discussed:  Goals   None     This is a list of the screening recommended for you and due dates:  Health Maintenance  Topic Date Due   COVID-19 Vaccine (1) Never done   Colon Cancer Screening  Never done   Zoster (Shingles) Vaccine (1 of 2) Never done   DTaP/Tdap/Td vaccine (2 - Td or Tdap) 08/19/2021   Flu Shot  08/05/2022   Medicare Annual Wellness Visit  06/14/2023   Pneumonia Vaccine  Completed   Hepatitis C Screening  Completed   HPV Vaccine  Aged Out     Next appointment: Follow up in one year for your annual wellness visit.   Preventive Care 52 Years and Older, Male Preventive care refers to lifestyle choices and visits with your health care provider that can promote health and wellness. What does preventive care include? A yearly physical exam. This is also called an annual well check. Dental exams once or twice a year. Routine eye exams. Ask your health care provider how often you should have your eyes checked. Personal lifestyle choices, including: Daily care of your teeth and gums. Regular physical activity. Eating a healthy diet. Avoiding tobacco and drug use. Limiting alcohol use. Practicing safe sex. Taking low doses of aspirin every day. Taking vitamin and mineral supplements as recommended by your health care provider. What happens during an annual well check? The services and screenings done by your health care provider during your annual well check will depend on your age, overall health, lifestyle risk factors, and family history of disease. Counseling  Your health care provider may ask you questions about your: Alcohol use. Tobacco use. Drug use. Emotional well-being. Home and relationship  well-being. Sexual activity. Eating habits. History of falls. Memory and ability to understand (cognition). Work and work Astronomer. Screening  You may have the following tests or measurements: Height, weight, and BMI. Blood pressure. Lipid and cholesterol levels. These may be checked every 5 years, or more frequently if you are over 74 years old. Skin check. Lung cancer screening. You may have this screening every year starting at age 42 if you have a 30-pack-year history of smoking and currently smoke or have quit within the past 15 years. Fecal occult blood test (FOBT) of the stool. You may have this test every year starting at age 63. Flexible sigmoidoscopy or colonoscopy. You may have a sigmoidoscopy every 5 years or a colonoscopy every 10 years starting at age 48. Prostate cancer screening. Recommendations will vary depending on your family history and other risks. Hepatitis C blood test. Hepatitis B blood test. Sexually transmitted disease (STD) testing. Diabetes screening. This is done by checking your blood sugar (glucose) after you have not eaten for a while (fasting). You may have this done every 1-3 years. Abdominal aortic aneurysm (AAA) screening. You may need this if you are a current or former smoker. Osteoporosis. You may be screened starting at age 40 if you are at high risk. Talk with your health care provider about your test results, treatment options, and if necessary, the need for more tests. Vaccines  Your health care provider may recommend certain vaccines, such as: Influenza vaccine. This is recommended every year. Tetanus,  diphtheria, and acellular pertussis (Tdap, Td) vaccine. You may need a Td booster every 10 years. Zoster vaccine. You may need this after age 82. Pneumococcal 13-valent conjugate (PCV13) vaccine. One dose is recommended after age 66. Pneumococcal polysaccharide (PPSV23) vaccine. One dose is recommended after age 43. Talk to your health care  provider about which screenings and vaccines you need and how often you need them. This information is not intended to replace advice given to you by your health care provider. Make sure you discuss any questions you have with your health care provider. Document Released: 01/17/2015 Document Revised: 09/10/2015 Document Reviewed: 10/22/2014 Elsevier Interactive Patient Education  2017 [place] Prevention in the Home Falls can cause injuries. They can happen to people of all ages. There are many things you can do to make your home safe and to help prevent falls. What can I do on the outside of my home? Regularly fix the edges of walkways and driveways and fix any cracks. Remove anything that might make you trip as you walk through a door, such as a raised step or threshold. Trim any bushes or trees on the path to your home. Use bright outdoor lighting. Clear any walking paths of anything that might make someone trip, such as rocks or tools. Regularly check to see if handrails are loose or broken. Make sure that both sides of any steps have handrails. Any raised decks and porches should have guardrails on the edges. Have any leaves, snow, or ice cleared regularly. Use sand or salt on walking paths during winter. Clean up any spills in your garage right away. This includes oil or grease spills. What can I do in the bathroom? Use night lights. Install grab bars by the toilet and in the tub and shower. Do not use towel bars as grab bars. Use non-skid mats or decals in the tub or shower. If you need to sit down in the shower, use a plastic, non-slip stool. Keep the floor dry. Clean up any water that spills on the floor as soon as it happens. Remove soap buildup in the tub or shower regularly. Attach bath mats securely with double-sided non-slip rug tape. Do not have throw rugs and other things on the floor that can make you trip. What can I do in the bedroom? Use night lights. Make  sure that you have a light by your bed that is easy to reach. Do not use any sheets or blankets that are too big for your bed. They should not hang down onto the floor. Have a firm chair that has side arms. You can use this for support while you get dressed. Do not have throw rugs and other things on the floor that can make you trip. What can I do in the kitchen? Clean up any spills right away. Avoid walking on wet floors. Keep items that you use a lot in easy-to-reach places. If you need to reach something above you, use a strong step stool that has a grab bar. Keep electrical cords out of the way. Do not use floor polish or wax that makes floors slippery. If you must use wax, use non-skid floor wax. Do not have throw rugs and other things on the floor that can make you trip. What can I do with my stairs? Do not leave any items on the stairs. Make sure that there are handrails on both sides of the stairs and use them. Fix handrails that are broken or loose. Make  sure that handrails are as long as the stairways. Check any carpeting to make sure that it is firmly attached to the stairs. Fix any carpet that is loose or worn. Avoid having throw rugs at the top or bottom of the stairs. If you do have throw rugs, attach them to the floor with carpet tape. Make sure that you have a light switch at the top of the stairs and the bottom of the stairs. If you do not have them, ask someone to add them for you. What else can I do to help prevent falls? Wear shoes that: Do not have high heels. Have rubber bottoms. Are comfortable and fit you well. Are closed at the toe. Do not wear sandals. If you use a stepladder: Make sure that it is fully opened. Do not climb a closed stepladder. Make sure that both sides of the stepladder are locked into place. Ask someone to hold it for you, if possible. Clearly mark and make sure that you can see: Any grab bars or handrails. First and last steps. Where the  edge of each step is. Use tools that help you move around (mobility aids) if they are needed. These include: Canes. Walkers. Scooters. Crutches. Turn on the lights when you go into a dark area. Replace any light bulbs as soon as they burn out. Set up your furniture so you have a clear path. Avoid moving your furniture around. If any of your floors are uneven, fix them. If there are any pets around you, be aware of where they are. Review your medicines with your doctor. Some medicines can make you feel dizzy. This can increase your chance of falling. Ask your doctor what other things that you can do to help prevent falls. This information is not intended to replace advice given to you by your health care provider. Make sure you discuss any questions you have with your health care provider. Document Released: 10/17/2008 Document Revised: 05/29/2015 Document Reviewed: 01/25/2014 Elsevier Interactive Patient Education  2017 [employer].

## 2022-06-24 ENCOUNTER — Ambulatory Visit: Payer: Medicare Other | Attending: Internal Medicine | Admitting: Internal Medicine

## 2022-06-24 ENCOUNTER — Encounter: Payer: Self-pay | Admitting: Internal Medicine

## 2022-06-24 VITALS — BP 124/70 | HR 62 | Ht 78.0 in | Wt 287.2 lb

## 2022-06-24 DIAGNOSIS — I2510 Atherosclerotic heart disease of native coronary artery without angina pectoris: Secondary | ICD-10-CM | POA: Diagnosis not present

## 2022-06-24 DIAGNOSIS — R931 Abnormal findings on diagnostic imaging of heart and coronary circulation: Secondary | ICD-10-CM | POA: Diagnosis not present

## 2022-06-24 DIAGNOSIS — I253 Aneurysm of heart: Secondary | ICD-10-CM | POA: Diagnosis not present

## 2022-06-24 DIAGNOSIS — I77819 Aortic ectasia, unspecified site: Secondary | ICD-10-CM | POA: Diagnosis not present

## 2022-06-24 DIAGNOSIS — I318 Other specified diseases of pericardium: Secondary | ICD-10-CM

## 2022-06-24 LAB — LIPID PANEL
Cholesterol, Total: 183 mg/dL (ref 100–199)
LDL Chol Calc (NIH): 125 mg/dL — ABNORMAL HIGH (ref 0–99)

## 2022-06-24 NOTE — Patient Instructions (Signed)
Medication Instructions:  Your physician recommends that you continue on your current medications as directed. Please refer to the Current Medication list given to you today.  *If you need a refill on your cardiac medications before your next appointment, please call your pharmacy*   Lab Work:  FLP. LPa  If you have labs (blood work) drawn today and your tests are completely normal, you will receive your results only by: MyChart Message (if you have MyChart) OR A paper copy in the mail If you have any lab test that is abnormal or we need to change your treatment, we will call you to review the results.   Testing/Procedures: NONE   Follow-Up: At [facility], you and your health needs are our priority.  As part of our continuing mission to provide you with exceptional heart care, we have created designated Provider Care Teams.  These Care Teams include your primary Cardiologist (physician) and Advanced Practice Providers (APPs -  Physician Assistants and Nurse Practitioners) who all work together to provide you with the care you need, when you need it.    Your next appointment:   1 year(s)  Provider:   Christell Constant, MD

## 2022-06-24 NOTE — Progress Notes (Signed)
Cardiology Office Note:    Date:  06/24/2022   ID:  Charles Navarro, DOB 12-02-1950, MRN [id number]  PCP:  Olive Bass, FNP   [facility] Providers Cardiologist:  Christell Constant, MD     Referring MD: [facility]   CC: LV aneurysm eval follow up  History of Present Illness:    Charles Navarro is a 72 y.o. male with a hx of OSA and HTN.  Concerns for an LV apical aneurysm and a TAA on a CXR. Since 2021: he has been getting respiratory problems every year.  This year he has an CXR that noted a LV aneurysm and pericardial calcifications (do not have primary images) 2024: pericardial calcification  planned for surgery.  Moderate aortic dilation.  45 mm.   Patient notes that he is doing well.   Since last visit notes that he is recovering from his back surgery and back pain is improving .  Started taking apple cider vinegar, raw honey, and cinnamon.   No chest pain or pressure .  No SOB/DOE and no PND/Orthopnea.  No weight gain or leg swelling.  No palpitations or syncope .   Past Medical History:  Diagnosis Date   Bronchitis    hx of   COPD (chronic obstructive pulmonary disease) (HCC)    Depression    GERD (gastroesophageal reflux disease)    Hypertension    takes meds daily   Sleep apnea    Vertigo     Past Surgical History:  Procedure Laterality Date   COLONOSCOPY     LUMBAR LAMINECTOMY/DECOMPRESSION MICRODISCECTOMY  11/08/2011   Procedure: LUMBAR LAMINECTOMY/DECOMPRESSION MICRODISCECTOMY 1 LEVEL;  Surgeon: Mariam Dollar, MD;  Location: [facility];  Service: Neurosurgery;  Laterality: Left;  left lumbar four-five decompression laminectomy microdiscectomy   LUMBAR LAMINECTOMY/DECOMPRESSION MICRODISCECTOMY Bilateral 04/21/2022   Procedure: LUMBAR TWO-THREE, LUMBAR THREE-FOUR BILATERAL LAMINECTOMY;  Surgeon: Donalee Citrin, MD;  Location: [facility];  Service: Neurosurgery;  Laterality: Bilateral;  3C   WISDOM TOOTH EXTRACTION      Current  Medications: Current Meds  Medication Sig   acetaminophen (TYLENOL) 325 MG tablet Take 650 mg by mouth every 6 (six) hours as needed for moderate pain or headache.   aspirin EC 81 MG tablet Take 1 tablet (81 mg total) by mouth daily. Swallow whole.   CAPSAICIN EX Apply 1 Application topically daily as needed (back pain).   cyclobenzaprine (FLEXERIL) 10 MG tablet Take 10 mg by mouth daily as needed for muscle spasms.   HYDROcodone-acetaminophen (NORCO/VICODIN) 5-325 MG tablet Take 2 tablets by mouth every 4 (four) hours as needed for severe pain ((score 7 to 10)).   lidocaine (LIDODERM) 5 % Place onto the skin as needed (back pain).   lisinopril-hydrochlorothiazide (ZESTORETIC) 10-12.5 MG tablet Take 1 tablet by mouth daily.   loratadine (CLARITIN) 10 MG tablet Take 10 mg by mouth daily as needed for allergies.   meclizine (ANTIVERT) 25 MG tablet Take 25 mg by mouth 3 (three) times daily as needed for dizziness. 1 - 3 tablets as needed for dizziness   Menthol-Camphor (TIGER BALM ARTHRITIS RUB EX) Apply 1 Application topically daily as needed (pain).   Multiple Vitamin (MULTIVITAMIN WITH MINERALS) TABS Take 1 tablet by mouth daily.   Omega-3 Fatty Acids (FISH OIL) 1000 MG CAPS Take 1,000 mg by mouth daily.   pantoprazole (PROTONIX) 40 MG tablet Take 40 mg by mouth daily.   Psyllium (METAMUCIL 4 IN 1 FIBER) 43 % POWD Take 1  Dose by mouth 4 (four) times a week. 1 dose = 2 teaspoons   sodium chloride (OCEAN) 0.65 % nasal spray Place into the nose as needed for congestion.     Allergies:   Levaquin [levofloxacin], Codeine, Gabapentin, Oxycodone-acetaminophen, Tramadol, and Bactrim [sulfamethoxazole-trimethoprim]   Social History   Socioeconomic History   Marital status: Married    Spouse name: Not on file   Number of children: Not on file   Years of education: Not on file   Highest education level: Not on file  Occupational History   Not on file  Tobacco Use   Smoking status: Never    Smokeless tobacco: Never  Vaping Use   Vaping Use: Never used  Substance and Sexual Activity   Alcohol use: No   Drug use: No   Sexual activity: Not on file  Other Topics Concern   Not on file  Social History Narrative   Not on file   Social Determinants of Health   Financial Resource Strain: Low Risk  (06/14/2022)   Overall Financial Resource Strain (CARDIA)    Difficulty of Paying Living Expenses: Not very hard  Food Insecurity: No Food Insecurity (06/14/2022)   Hunger Vital Sign    Worried About Running Out of Food in the Last Year: Never true    Ran Out of Food in the Last Year: Never true  Transportation Needs: No Transportation Needs (06/14/2022)   PRAPARE - Administrator, Civil Service (Medical): No    Lack of Transportation (Non-Medical): No  Physical Activity: Inactive (06/14/2022)   Exercise Vital Sign    Days of Exercise per Week: 0 days    Minutes of Exercise per Session: 0 min  Stress: Stress Concern Present (06/14/2022)   [employer] of Occupational Health - Occupational Stress Questionnaire    Feeling of Stress : To some extent  Social Connections: Moderately Integrated (06/14/2022)   Social Connection and Isolation Panel [NHANES]    Frequency of Communication with Friends and Family: More than three times a week    Frequency of Social Gatherings with Friends and Family: More than three times a week    Attends Religious Services: More than 4 times per year    Active Member of [employer] or Organizations: No    Attends Banker Meetings: Never    Marital Status: Married     Family History: The patient's family history includes Alcoholism in his father.  ROS:   Please see the history of present illness.     EKGs/Labs/Other Studies Reviewed:    The following studies were reviewed today:  EKG:   03/29/22: SR pulmonary disease pattern, septal and inferior infarct pattern, LAFB  Cardiac Studies & Procedures        ECHOCARDIOGRAM  ECHOCARDIOGRAM COMPLETE 04/27/2022  Narrative ECHOCARDIOGRAM REPORT    Patient Name:   Charles Navarro   Date of Exam: 04/27/2022 Medical Rec #:  [id number]     Height:       78.0 in Accession #:    [id number]    Weight:       271.0 lb Date of Birth:  [birth year]     BSA:          2.569 m Patient Age:    71 years      BP:           118/80 mmHg Patient Gender: M             HR:  64 bpm. Exam Location:  [address]  Procedure: 2D Echo, Cardiac Doppler and Color Doppler  Indications:    I77.819 Dilated aorta  History:        Patient has no prior history of Echocardiogram examinations. Pericardial calcification, Dilated aorta, Arrythmias:LV aneurysm, Signs/Symptoms:Shortness of Breath; Risk Factors:Hypertension.  Sonographer:    Samule Ohm RDCS Referring Phys: [id number] [facility] A Amra Shukla  IMPRESSIONS   1. Left ventricular ejection fraction, by estimation, is 60 to 65%. The left ventricle has normal function. The left ventricle has no regional wall motion abnormalities. There is moderate left ventricular hypertrophy. Left ventricular diastolic parameters are consistent with Grade I diastolic dysfunction (impaired relaxation). 2. Right ventricular systolic function is normal. The right ventricular size is normal. 3. Left atrial size was moderately dilated. 4. The mitral valve is normal in structure. No evidence of mitral valve regurgitation. No evidence of mitral stenosis. 5. The aortic valve is tricuspid. Aortic valve regurgitation is mild. No aortic stenosis is present. 6. There is mild dilatation of the ascending aorta, measuring 41 mm. 7. The inferior vena cava is normal in size with greater than 50% respiratory variability, suggesting right atrial pressure of 3 mmHg.  FINDINGS Left Ventricle: Left ventricular ejection fraction, by estimation, is 60 to 65%. The left ventricle has normal function. The left ventricle has no regional wall motion  abnormalities. The left ventricular internal cavity size was normal in size. There is moderate left ventricular hypertrophy. Left ventricular diastolic parameters are consistent with Grade I diastolic dysfunction (impaired relaxation).  Right Ventricle: The right ventricular size is normal. No increase in right ventricular wall thickness. Right ventricular systolic function is normal.  Left Atrium: Left atrial size was moderately dilated.  Right Atrium: Right atrial size was normal in size.  Pericardium: Trivial pericardial effusion is present. The pericardial effusion is posterior to the left ventricle.  Mitral Valve: The mitral valve is normal in structure. No evidence of mitral valve regurgitation. No evidence of mitral valve stenosis.  Tricuspid Valve: The tricuspid valve is normal in structure. Tricuspid valve regurgitation is trivial. No evidence of tricuspid stenosis.  Aortic Valve: The aortic valve is tricuspid. Aortic valve regurgitation is mild. No aortic stenosis is present.  Pulmonic Valve: The pulmonic valve was normal in structure. Pulmonic valve regurgitation is not visualized. No evidence of pulmonic stenosis.  Aorta: The aortic root is normal in size and structure. There is mild dilatation of the ascending aorta, measuring 41 mm.  Venous: The inferior vena cava is normal in size with greater than 50% respiratory variability, suggesting right atrial pressure of 3 mmHg.  IAS/Shunts: No atrial level shunt detected by color flow Doppler.   LEFT VENTRICLE PLAX 2D LVIDd:         3.40 cm   Diastology LVIDs:         2.30 cm   LV e' medial:    5.87 cm/s LV PW:         1.40 cm   LV E/e' medial:  10.7 LV IVS:        1.50 cm   LV e' lateral:   6.96 cm/s LVOT diam:     2.10 cm   LV E/e' lateral: 9.0 LV SV:         91 LV SV Index:   36 LVOT Area:     3.46 cm   RIGHT VENTRICLE             IVC RV S prime:  12.40 cm/s  IVC diam: 1.30 cm TAPSE (M-mode): 1.6 cm RVSP:            25.5 mmHg  LEFT ATRIUM             Index        RIGHT ATRIUM           Index LA diam:        4.40 cm 1.71 cm/m   RA Pressure: 3.00 mmHg LA Vol (A2C):   76.7 ml 29.86 ml/m  RA Area:     15.80 cm LA Vol (A4C):   74.2 ml 28.89 ml/m  RA Volume:   39.80 ml  15.49 ml/m LA Biplane Vol: 80.7 ml 31.42 ml/m AORTIC VALVE LVOT Vmax:   125.00 cm/s LVOT Vmean:  86.800 cm/s LVOT VTI:    0.264 m  AORTA Ao Root diam: 3.70 cm Ao Asc diam:  4.10 cm  MITRAL VALVE               TRICUSPID VALVE MV Area (PHT): 2.21 cm    TR Peak grad:   22.5 mmHg MV Decel Time: 343 msec    TR Vmax:        237.00 cm/s MV E velocity: 62.90 cm/s  Estimated RAP:  3.00 mmHg MV A velocity: 94.90 cm/s  RVSP:           25.5 mmHg MV E/A ratio:  0.66 SHUNTS Systemic VTI:  0.26 m Systemic Diam: 2.10 cm  Charlton Haws MD Electronically signed by Charlton Haws MD Signature Date/Time: 04/27/2022/8:44:55 AM    Final     CT SCANS  CT CORONARY MORPH W/CTA COR W/SCORE 04/10/2022  Addendum 04/10/2022  9:44 AM ADDENDUM REPORT: 04/10/2022 09:42  CLINICAL DATA:  This over-read does not include interpretation of cardiac or coronary anatomy or pathology. The coronary CTA interpretation by the cardiologist is attached.  COMPARISON:  None available.  FINDINGS: No suspicious nodules, masses, or infiltrates are identified in the visualized portion of the lungs. No pleural fluid seen. Mild bilateral subpleural scarring noted with mild bilateral lower lobe peripheral bronchiectasis.  4.3 cm ascending thoracic aortic aneurysm is seen. Tiny hiatal hernia is noted. Images through the upper abdomen also show incomplete visualization of a low-attenuation lesion in the left upper quadrant between the pancreatic tail and spleen, which is suspicious for a cystic lesion.  IMPRESSION: 4.3 cm ascending thoracic aortic aneurysm. Recommend annual imaging followup by CTA or MRA. This recommendation follows  2010 ACCF/AHA/AATS/ACR/ASA/SCA/SCAI/SIR/STS/SVM Guidelines for the Diagnosis and Management of Patients with Thoracic Aortic Disease. Circulation. 2010; 121: U981-X914. Aortic aneurysm NOS (ICD10-I71.9)  Mild bilateral lower lobe peripheral bronchiectasis.  Tiny hiatal hernia.  Incomplete visualization of a low-attenuation lesion in the left upper quadrant between the pancreatic tail and spleen, suspicious for a cystic lesion. Recommend further evaluation with abdomen pelvis CT with contrast.   Electronically Signed By: Danae Orleans M.D. On: 04/10/2022 09:42  Narrative CLINICAL DATA:  72 year old with shortness of breath, LV apical aneurysm by OSH imaging.  EXAM: Cardiac/Coronary  CTA  TECHNIQUE: The patient was scanned on a [employer].  FINDINGS: A 120 kV prospective scan was triggered in the descending thoracic aorta at 111 HU's. Axial non-contrast 3 mm slices were carried out through the heart. The data set was analyzed on a dedicated work station and scored using the Agatson method. Gantry rotation speed was 250 msecs and collimation was .6 mm. 0.8 mg of sl NTG was given. The  3D data set was reconstructed in 5% intervals of the 67-82 % of the R-R cycle. Diastolic phases were analyzed on a dedicated work station using MPR, MIP and VRT modes. The patient received 80 cc of contrast.  Image quality: fair  Aorta: Mildly dilated ascending aorta 42 mm. No calcifications. No dissection.  Aortic Valve:  Trileaflet.  No calcifications.  Coronary Arteries:  Normal coronary origin.  Right dominance.  RCA is a large dominant artery that gives rise to PDA and PLA. There is scattered diffuse mixed plaque mild to moderate stenosis up to 50-69%. FFR distal 0.7  Left main is a large artery that gives rise to LAD and LCX arteries.  LAD is a large vessel that has scattered diffuse mixed plaque with mild to moderate stenosis up to 50-69%.  D1-high takeoff,  calcified plaque.  LCX is a non-dominant artery. There is mixed plaque with moderate 50-69% stenosis.  OM1- small caliber vessel.  Other findings:  Normal pulmonary vein drainage into the left atrium.  Normal left atrial appendage without a thrombus.  Normal size of the pulmonary artery.  Calcified pericardium mostly along left lateral LV wall.  Apical thinning of myocardium.  Please see radiology report for non cardiac findings.  IMPRESSION: 1. Coronary calcium score of 2016. This was 31 percentile for age and sex matched control.  2. Total plaque volume (TPV) 1399 mm3 which is 84th percentile for age-and sex matched controls (calcified plaque 539 mm3; non-calcified plaque 860 mm3). TPV is extensive.  3. Normal coronary origin with right dominance.  4. Moderate diffuse CAD. Distal RCA FFR 0.7 (0.86 mid). Distal LAD FFR 0.73 (cumulative decrease).  5. Mildly dilated ascending aorta 42 mm.  6. Calcified pericardium mostly along left lateral LV wall.  7. Apical thinning of myocardium.  Electronically Signed: By: Donato Schultz M.D. On: 04/07/2022 15:44          Recent Labs: 03/29/2022: BUN 13; Creatinine, Ser 1.19; Potassium 4.4; Sodium 136 04/21/2022: Hemoglobin 15.6; Platelets 220  Recent Lipid Panel No results found for: "CHOL", "TRIG", "HDL", "CHOLHDL", "VLDL", "LDLCALC", "LDLDIRECT"    Physical Exam:    VS:  BP 124/70   Pulse 62   Ht 6\' 6"  (1.981 m)   Wt 287 lb 3.2 oz (130.3 kg)   SpO2 98%   BMI 33.19 kg/m     Wt Readings from Last 3 Encounters:  06/24/22 287 lb 3.2 oz (130.3 kg)  05/27/22 282 lb 9.6 oz (128.2 kg)  04/21/22 271 lb (122.9 kg)    GEN:  Well nourished, well developed in no acute distress HEENT: Normal NECK: No JVD CARDIAC: RRR, no murmurs, rubs, gallops RESPIRATORY:  Clear to auscultation without rales, wheezing or rhonchi  ABDOMEN: Soft, non-tender, non-distended MUSCULOSKELETAL:  trace edema; No deformity  SKIN: Warm and  dry NEUROLOGIC:  Alert and oriented x 3 PSYCHIATRIC:  Normal affect   ASSESSMENT:    1. Coronary artery disease involving native coronary artery of native heart without angina pectoris   2. Dilatation of aorta (HCC)   3. Pericardial calcification   4. Abnormal findings diagnostic imaging of heart and coronary circulation   5. Left ventricular aneurysm      PLAN:    Moderate non obstructive Cad HLD - on ASA 81 mg PO daily - fasting lipids today and Lp(a) - asymptomatic - LDL goal < 70 to start we will likely start medication and make dietary changes to his eating out and night time snacking, when LDL <  55 we will attempt to wean off medication  Mild aortic dilation ~ 43 mm on CT Aorta Pericardial calcification of unclear significance Small apical aneurysm - CBC today  - goal is to look for apical scar, to rule out apical hypertrophic cardiomyopathy, and to rule out pericardial constriction; will do MRI Strain imaging   COPD with Lung scarring - he is smoke free  HTN - controlled on current therapy  One year with me       Medication Adjustments/Labs and Tests Ordered: Current medicines are reviewed at length with the patient today.  Concerns regarding medicines are outlined above.  Orders Placed This Encounter  Procedures   Lipid panel   Lipoprotein A (LPA)   No orders of the defined types were placed in this encounter.   Patient Instructions  Medication Instructions:  Your physician recommends that you continue on your current medications as directed. Please refer to the Current Medication list given to you today.  *If you need a refill on your cardiac medications before your next appointment, please call your pharmacy*   Lab Work:  FLP. LPa  If you have labs (blood work) drawn today and your tests are completely normal, you will receive your results only by: MyChart Message (if you have MyChart) OR A paper copy in the mail If you have any lab test that  is abnormal or we need to change your treatment, we will call you to review the results.   Testing/Procedures: NONE   Follow-Up: At [facility], you and your health needs are our priority.  As part of our continuing mission to provide you with exceptional heart care, we have created designated Provider Care Teams.  These Care Teams include your primary Cardiologist (physician) and Advanced Practice Providers (APPs -  Physician Assistants and Nurse Practitioners) who all work together to provide you with the care you need, when you need it.    Your next appointment:   1 year(s)  Provider:   Christell Constant, MD        Signed, Christell Constant, MD  06/24/2022 8:55 AM    [facility]

## 2022-06-26 LAB — LIPOPROTEIN A (LPA): Lipoprotein (a): <8.4 nmol/L (ref <75.0)

## 2022-06-26 LAB — LIPID PANEL
Chol/HDL Ratio: 4.6 ratio (ref 0.0–5.0)
HDL: 40 mg/dL (ref >39)
Triglycerides: 100 mg/dL (ref 0–149)
VLDL Cholesterol Cal: 18 mg/dL (ref 5–40)

## 2022-07-14 DIAGNOSIS — M545 Low back pain, unspecified: Secondary | ICD-10-CM | POA: Diagnosis not present

## 2022-07-16 ENCOUNTER — Encounter (HOSPITAL_COMMUNITY): Payer: Self-pay

## 2022-07-19 ENCOUNTER — Other Ambulatory Visit: Payer: Self-pay | Admitting: Internal Medicine

## 2022-07-19 ENCOUNTER — Ambulatory Visit (HOSPITAL_COMMUNITY)
Admission: RE | Admit: 2022-07-19 | Discharge: 2022-07-19 | Disposition: A | Discharge status: Home / Self Care | Payer: Medicare Other | Source: Ambulatory Visit | Attending: Internal Medicine | Admitting: Internal Medicine

## 2022-07-19 DIAGNOSIS — I77819 Aortic ectasia, unspecified site: Secondary | ICD-10-CM

## 2022-07-19 DIAGNOSIS — I422 Other hypertrophic cardiomyopathy: Secondary | ICD-10-CM | POA: Diagnosis not present

## 2022-07-19 MED ORDER — GADOBUTROL 1 MMOL/ML IV SOLN
10.0000 mL | Freq: Once | INTRAVENOUS | Status: AC | PRN
  Administered 2022-07-19: 10 mL via INTRAVENOUS

## 2022-07-21 DIAGNOSIS — M545 Low back pain, unspecified: Secondary | ICD-10-CM | POA: Diagnosis not present

## 2022-07-21 DIAGNOSIS — M5442 Lumbago with sciatica, left side: Secondary | ICD-10-CM | POA: Diagnosis not present

## 2022-07-22 DIAGNOSIS — M5442 Lumbago with sciatica, left side: Secondary | ICD-10-CM | POA: Diagnosis not present

## 2022-07-22 DIAGNOSIS — M545 Low back pain, unspecified: Secondary | ICD-10-CM | POA: Diagnosis not present

## 2022-07-28 DIAGNOSIS — M5442 Lumbago with sciatica, left side: Secondary | ICD-10-CM | POA: Diagnosis not present

## 2022-07-28 DIAGNOSIS — M545 Low back pain, unspecified: Secondary | ICD-10-CM | POA: Diagnosis not present

## 2022-07-29 ENCOUNTER — Other Ambulatory Visit: Payer: Self-pay

## 2022-07-29 DIAGNOSIS — M5442 Lumbago with sciatica, left side: Secondary | ICD-10-CM | POA: Diagnosis not present

## 2022-07-29 DIAGNOSIS — I253 Aneurysm of heart: Secondary | ICD-10-CM

## 2022-07-29 DIAGNOSIS — M545 Low back pain, unspecified: Secondary | ICD-10-CM | POA: Diagnosis not present

## 2022-07-29 DIAGNOSIS — I2510 Atherosclerotic heart disease of native coronary artery without angina pectoris: Secondary | ICD-10-CM

## 2022-07-29 DIAGNOSIS — I77819 Aortic ectasia, unspecified site: Secondary | ICD-10-CM

## 2022-07-29 DIAGNOSIS — I318 Other specified diseases of pericardium: Secondary | ICD-10-CM

## 2022-07-29 NOTE — Progress Notes (Unsigned)
Placed an order for CT aorta due July 2025

## 2022-08-03 ENCOUNTER — Other Ambulatory Visit (HOSPITAL_COMMUNITY): Payer: Medicare Other

## 2022-08-11 DIAGNOSIS — M5442 Lumbago with sciatica, left side: Secondary | ICD-10-CM | POA: Diagnosis not present

## 2022-08-11 DIAGNOSIS — M545 Low back pain, unspecified: Secondary | ICD-10-CM | POA: Diagnosis not present

## 2022-08-13 DIAGNOSIS — M5442 Lumbago with sciatica, left side: Secondary | ICD-10-CM | POA: Diagnosis not present

## 2022-08-13 DIAGNOSIS — M545 Low back pain, unspecified: Secondary | ICD-10-CM | POA: Diagnosis not present

## 2022-08-25 DIAGNOSIS — M5442 Lumbago with sciatica, left side: Secondary | ICD-10-CM | POA: Diagnosis not present

## 2022-08-25 DIAGNOSIS — M545 Low back pain, unspecified: Secondary | ICD-10-CM | POA: Diagnosis not present

## 2022-08-27 DIAGNOSIS — M5442 Lumbago with sciatica, left side: Secondary | ICD-10-CM | POA: Diagnosis not present

## 2022-08-27 DIAGNOSIS — M545 Low back pain, unspecified: Secondary | ICD-10-CM | POA: Diagnosis not present

## 2022-09-01 DIAGNOSIS — M5442 Lumbago with sciatica, left side: Secondary | ICD-10-CM | POA: Diagnosis not present

## 2022-09-01 DIAGNOSIS — M545 Low back pain, unspecified: Secondary | ICD-10-CM | POA: Diagnosis not present

## 2022-09-08 DIAGNOSIS — M5442 Lumbago with sciatica, left side: Secondary | ICD-10-CM | POA: Diagnosis not present

## 2022-09-08 DIAGNOSIS — M545 Low back pain, unspecified: Secondary | ICD-10-CM | POA: Diagnosis not present

## 2022-09-23 DIAGNOSIS — M5440 Lumbago with sciatica, unspecified side: Secondary | ICD-10-CM | POA: Diagnosis not present

## 2022-09-23 DIAGNOSIS — Z6833 Body mass index (BMI) 33.0-33.9, adult: Secondary | ICD-10-CM | POA: Diagnosis not present

## 2022-09-28 DIAGNOSIS — M47816 Spondylosis without myelopathy or radiculopathy, lumbar region: Secondary | ICD-10-CM | POA: Diagnosis not present

## 2022-10-07 DIAGNOSIS — Z961 Presence of intraocular lens: Secondary | ICD-10-CM | POA: Diagnosis not present

## 2022-10-07 DIAGNOSIS — H16223 Keratoconjunctivitis sicca, not specified as Sjogren's, bilateral: Secondary | ICD-10-CM | POA: Diagnosis not present

## 2022-10-15 DIAGNOSIS — M47816 Spondylosis without myelopathy or radiculopathy, lumbar region: Secondary | ICD-10-CM | POA: Diagnosis not present

## 2022-10-15 DIAGNOSIS — M5416 Radiculopathy, lumbar region: Secondary | ICD-10-CM | POA: Diagnosis not present

## 2022-11-04 DIAGNOSIS — M47816 Spondylosis without myelopathy or radiculopathy, lumbar region: Secondary | ICD-10-CM | POA: Diagnosis not present

## 2022-11-25 DIAGNOSIS — M5416 Radiculopathy, lumbar region: Secondary | ICD-10-CM | POA: Diagnosis not present

## 2022-11-25 DIAGNOSIS — Z6833 Body mass index (BMI) 33.0-33.9, adult: Secondary | ICD-10-CM | POA: Diagnosis not present

## 2023-01-06 DIAGNOSIS — M549 Dorsalgia, unspecified: Secondary | ICD-10-CM | POA: Diagnosis not present

## 2023-01-06 DIAGNOSIS — M25552 Pain in left hip: Secondary | ICD-10-CM | POA: Diagnosis not present

## 2023-01-06 DIAGNOSIS — M79605 Pain in left leg: Secondary | ICD-10-CM | POA: Diagnosis not present

## 2023-01-11 DIAGNOSIS — M961 Postlaminectomy syndrome, not elsewhere classified: Secondary | ICD-10-CM | POA: Diagnosis not present

## 2023-01-11 DIAGNOSIS — M5451 Vertebrogenic low back pain: Secondary | ICD-10-CM | POA: Diagnosis not present

## 2023-01-28 ENCOUNTER — Ambulatory Visit (INDEPENDENT_AMBULATORY_CARE_PROVIDER_SITE_OTHER): Payer: Medicare Other | Admitting: Family

## 2023-01-28 ENCOUNTER — Encounter: Payer: Self-pay | Admitting: Family

## 2023-01-28 VITALS — BP 138/86 | HR 59 | Ht 78.0 in | Wt 285.8 lb

## 2023-01-28 DIAGNOSIS — M4646 Discitis, unspecified, lumbar region: Secondary | ICD-10-CM

## 2023-01-28 DIAGNOSIS — I10 Essential (primary) hypertension: Secondary | ICD-10-CM

## 2023-01-28 DIAGNOSIS — G4733 Obstructive sleep apnea (adult) (pediatric): Secondary | ICD-10-CM

## 2023-01-28 DIAGNOSIS — I422 Other hypertrophic cardiomyopathy: Secondary | ICD-10-CM | POA: Insufficient documentation

## 2023-01-28 DIAGNOSIS — I2510 Atherosclerotic heart disease of native coronary artery without angina pectoris: Secondary | ICD-10-CM | POA: Diagnosis not present

## 2023-01-28 NOTE — Patient Instructions (Signed)
Please continue to see your PCP through the [place] as we discussed; if they do not continue to do your labs as planned and discussed, please let me know.   I am glad you are working with Dr. Shon Baton. I am hopeful he will be able to get you some relief for your back.

## 2023-01-28 NOTE — Progress Notes (Signed)
Charles Navarro is a 73 y.o. male with the following history as recorded in EpicCare:  Patient Active Problem List   Diagnosis Date Noted   Apical variant hypertrophic cardiomyopathy (HCC) 01/28/2023   Obesity, morbid (HCC) 01/28/2023   Coronary artery disease involving native coronary artery of native heart without angina pectoris 06/24/2022   Spinal stenosis of lumbar region 04/21/2022   Pneumonia due to COVID-19 virus 02/08/2019   OSA (obstructive sleep apnea) 02/08/2019   Boil 03/06/2012   Discitis of lumbar region 01/31/2012   Diarrhea 01/31/2012   Nausea 01/31/2012   Acid reflux 01/31/2012   Weight loss, abnormal 01/31/2012    Current Outpatient Medications  Medication Sig Dispense Refill   acetaminophen (TYLENOL) 325 MG tablet Take 650 mg by mouth every 6 (six) hours as needed for moderate pain or headache.     aspirin EC 81 MG tablet Take 1 tablet (81 mg total) by mouth daily. Swallow whole. 90 tablet 3   CAPSAICIN EX Apply 1 Application topically daily as needed (back pain).     cyclobenzaprine (FLEXERIL) 10 MG tablet Take 10 mg by mouth daily as needed for muscle spasms.     HYDROcodone-acetaminophen (NORCO/VICODIN) 5-325 MG tablet Take 2 tablets by mouth every 4 (four) hours as needed for severe pain ((score 7 to 10)). 30 tablet 0   lidocaine (LIDODERM) 5 % Place onto the skin as needed (back pain).     lisinopril-hydrochlorothiazide (ZESTORETIC) 10-12.5 MG tablet Take 1 tablet by mouth daily.     loratadine (CLARITIN) 10 MG tablet Take 10 mg by mouth daily as needed for allergies.     meclizine (ANTIVERT) 25 MG tablet Take 25 mg by mouth 3 (three) times daily as needed for dizziness. 1 - 3 tablets as needed for dizziness     Menthol-Camphor (TIGER BALM ARTHRITIS RUB EX) Apply 1 Application topically daily as needed (pain).     Multiple Vitamin (MULTIVITAMIN WITH MINERALS) TABS Take 1 tablet by mouth daily.     Omega-3 Fatty Acids (FISH OIL) 1000 MG CAPS Take 1,000 mg by mouth  daily.     pantoprazole (PROTONIX) 40 MG tablet Take 40 mg by mouth daily.     Psyllium (METAMUCIL 4 IN 1 FIBER) 43 % POWD Take 1 Dose by mouth 4 (four) times a week. 1 dose = 2 teaspoons     sodium chloride (OCEAN) 0.65 % nasal spray Place into the nose as needed for congestion.     No current facility-administered medications for this visit.    Allergies: Levaquin [levofloxacin], Codeine, Gabapentin, Oxycodone-acetaminophen, Tramadol, and Bactrim [sulfamethoxazole-trimethoprim]  Past Medical History:  Diagnosis Date   Bronchitis    hx of   COPD (chronic obstructive pulmonary disease) (HCC)    Depression    GERD (gastroesophageal reflux disease)    Hypertension    takes meds daily   Sleep apnea    Vertigo     Past Surgical History:  Procedure Laterality Date   COLONOSCOPY     LUMBAR LAMINECTOMY/DECOMPRESSION MICRODISCECTOMY  11/08/2011   Procedure: LUMBAR LAMINECTOMY/DECOMPRESSION MICRODISCECTOMY 1 LEVEL;  Surgeon: Mariam Dollar, MD;  Location: [facility];  Service: Neurosurgery;  Laterality: Left;  left lumbar four-five decompression laminectomy microdiscectomy   LUMBAR LAMINECTOMY/DECOMPRESSION MICRODISCECTOMY Bilateral 04/21/2022   Procedure: LUMBAR TWO-THREE, LUMBAR THREE-FOUR BILATERAL LAMINECTOMY;  Surgeon: Donalee Citrin, MD;  Location: [facility];  Service: Neurosurgery;  Laterality: Bilateral;  3C   WISDOM TOOTH EXTRACTION      Family History  Problem Relation  Age of Onset   Alcoholism Father     Social History   Tobacco Use   Smoking status: Never   Smokeless tobacco: Never  Substance Use Topics   Alcohol use: No    Subjective:   Presents for yearly follow up on chronic care needs; does have PCP at the [place] who is managing majority of his healthcare needs including HTN, GERD;  has recently established with [facility] ( Dr. Shon Baton) for chronic care back issues;  Scheduled to see his PCP at the [facility] ( Dr. Lise Auer) on next Friday for his CPE there; [facility]; per patient, he is having labs 3 x per year through [place];  he does want to continue with primary care management through the [place];   Scheduled to see Dr. Lanae Boast ( GI) on 03/01/23 to discuss endoscopy and colonoscopy;   Has follow up with cardiology in June 2025;   CPAP machine is managed through [place] as well;     Objective:  Vitals:   01/28/23 0948  BP: 138/86  Pulse: (!) 59  SpO2: 98%  Weight: 285 lb 12.8 oz (129.6 kg)  Height: 6\' 6"  (1.981 m)    General: Well developed, well nourished, in no acute distress  Skin : Warm and dry.  Head: Normocephalic and atraumatic  Eyes: Sclera and conjunctiva clear; pupils round and reactive to light; extraocular movements intact  Ears: External normal; canals clear; tympanic membranes normal  Oropharynx: Pink, supple. No suspicious lesions  Neck: Supple without thyromegaly, adenopathy  Lungs: Respirations unlabored; clear to auscultation bilaterally without wheeze, rales, rhonchi  CVS exam: normal rate and regular rhythm.  Abdomen: Soft; nontender; nondistended; normoactive bowel sounds; no masses or hepatosplenomegaly  Musculoskeletal: No deformities; no active joint inflammation  Extremities: No edema, cyanosis, clubbing  Vessels: Symmetric bilaterally  Neurologic: Alert and oriented; speech intact; face symmetrical; moves all extremities well; CNII-XII intact without focal deficit   Assessment:  1. Apical variant hypertrophic cardiomyopathy (HCC) Chronic  2. Obesity, morbid (HCC) Chronic  3. Coronary artery disease involving native coronary artery of native heart without angina pectoris   4. OSA (obstructive sleep apnea)   5. Discitis of lumbar region   6. Primary hypertension     Plan:  Patient has CPE with labs scheduled at the [place] next Friday; he is scheduled to see GI, cardiology as well; he is working with [facility] for chronic back issues; he is scheduled to see GI in February 2025;   Time spent 30 minutes  No  follow-ups on file.  No orders of the defined types were placed in this encounter.   Requested Prescriptions    No prescriptions requested or ordered in this encounter

## 2023-01-31 DIAGNOSIS — M5451 Vertebrogenic low back pain: Secondary | ICD-10-CM | POA: Diagnosis not present

## 2023-01-31 DIAGNOSIS — M545 Low back pain, unspecified: Secondary | ICD-10-CM | POA: Diagnosis not present

## 2023-02-09 DIAGNOSIS — M5451 Vertebrogenic low back pain: Secondary | ICD-10-CM | POA: Diagnosis not present

## 2023-02-10 DIAGNOSIS — M5416 Radiculopathy, lumbar region: Secondary | ICD-10-CM | POA: Diagnosis not present

## 2023-02-28 DIAGNOSIS — M545 Low back pain, unspecified: Secondary | ICD-10-CM | POA: Diagnosis not present

## 2023-02-28 DIAGNOSIS — R2689 Other abnormalities of gait and mobility: Secondary | ICD-10-CM | POA: Diagnosis not present

## 2023-03-01 DIAGNOSIS — K2270 Barrett's esophagus without dysplasia: Secondary | ICD-10-CM | POA: Diagnosis not present

## 2023-03-01 DIAGNOSIS — K219 Gastro-esophageal reflux disease without esophagitis: Secondary | ICD-10-CM | POA: Diagnosis not present

## 2023-03-03 DIAGNOSIS — R2689 Other abnormalities of gait and mobility: Secondary | ICD-10-CM | POA: Diagnosis not present

## 2023-03-03 DIAGNOSIS — M545 Low back pain, unspecified: Secondary | ICD-10-CM | POA: Diagnosis not present

## 2023-03-10 DIAGNOSIS — R2689 Other abnormalities of gait and mobility: Secondary | ICD-10-CM | POA: Diagnosis not present

## 2023-03-10 DIAGNOSIS — M545 Low back pain, unspecified: Secondary | ICD-10-CM | POA: Diagnosis not present

## 2023-03-15 DIAGNOSIS — R2689 Other abnormalities of gait and mobility: Secondary | ICD-10-CM | POA: Diagnosis not present

## 2023-03-15 DIAGNOSIS — M545 Low back pain, unspecified: Secondary | ICD-10-CM | POA: Diagnosis not present

## 2023-03-24 DIAGNOSIS — M545 Low back pain, unspecified: Secondary | ICD-10-CM | POA: Diagnosis not present

## 2023-03-24 DIAGNOSIS — R2689 Other abnormalities of gait and mobility: Secondary | ICD-10-CM | POA: Diagnosis not present

## 2023-03-28 DIAGNOSIS — M25552 Pain in left hip: Secondary | ICD-10-CM | POA: Diagnosis not present

## 2023-03-28 DIAGNOSIS — M5451 Vertebrogenic low back pain: Secondary | ICD-10-CM | POA: Diagnosis not present

## 2023-03-30 DIAGNOSIS — I10 Essential (primary) hypertension: Secondary | ICD-10-CM | POA: Diagnosis not present

## 2023-03-30 DIAGNOSIS — Z8719 Personal history of other diseases of the digestive system: Secondary | ICD-10-CM | POA: Diagnosis not present

## 2023-03-30 DIAGNOSIS — K2289 Other specified disease of esophagus: Secondary | ICD-10-CM | POA: Diagnosis not present

## 2023-03-30 DIAGNOSIS — Z79899 Other long term (current) drug therapy: Secondary | ICD-10-CM | POA: Diagnosis not present

## 2023-03-30 DIAGNOSIS — G4733 Obstructive sleep apnea (adult) (pediatric): Secondary | ICD-10-CM | POA: Diagnosis not present

## 2023-03-30 DIAGNOSIS — K449 Diaphragmatic hernia without obstruction or gangrene: Secondary | ICD-10-CM | POA: Diagnosis not present

## 2023-03-30 DIAGNOSIS — K208 Other esophagitis without bleeding: Secondary | ICD-10-CM | POA: Diagnosis not present

## 2023-03-30 DIAGNOSIS — I2510 Atherosclerotic heart disease of native coronary artery without angina pectoris: Secondary | ICD-10-CM | POA: Diagnosis not present

## 2023-03-30 DIAGNOSIS — I422 Other hypertrophic cardiomyopathy: Secondary | ICD-10-CM | POA: Diagnosis not present

## 2023-03-30 DIAGNOSIS — K2270 Barrett's esophagus without dysplasia: Secondary | ICD-10-CM | POA: Diagnosis not present

## 2023-03-30 DIAGNOSIS — Z7982 Long term (current) use of aspirin: Secondary | ICD-10-CM | POA: Diagnosis not present

## 2023-03-30 DIAGNOSIS — F32A Depression, unspecified: Secondary | ICD-10-CM | POA: Diagnosis not present

## 2023-03-31 DIAGNOSIS — R2689 Other abnormalities of gait and mobility: Secondary | ICD-10-CM | POA: Diagnosis not present

## 2023-03-31 DIAGNOSIS — M545 Low back pain, unspecified: Secondary | ICD-10-CM | POA: Diagnosis not present

## 2023-04-13 DIAGNOSIS — G8929 Other chronic pain: Secondary | ICD-10-CM | POA: Diagnosis not present

## 2023-04-13 DIAGNOSIS — M25552 Pain in left hip: Secondary | ICD-10-CM | POA: Diagnosis not present

## 2023-04-19 DIAGNOSIS — M545 Low back pain, unspecified: Secondary | ICD-10-CM | POA: Diagnosis not present

## 2023-04-19 DIAGNOSIS — R2689 Other abnormalities of gait and mobility: Secondary | ICD-10-CM | POA: Diagnosis not present

## 2023-04-28 DIAGNOSIS — M25552 Pain in left hip: Secondary | ICD-10-CM | POA: Diagnosis not present

## 2023-04-28 DIAGNOSIS — G8929 Other chronic pain: Secondary | ICD-10-CM | POA: Diagnosis not present

## 2023-05-03 DIAGNOSIS — M545 Low back pain, unspecified: Secondary | ICD-10-CM | POA: Diagnosis not present

## 2023-05-03 DIAGNOSIS — R2689 Other abnormalities of gait and mobility: Secondary | ICD-10-CM | POA: Diagnosis not present

## 2023-05-12 DIAGNOSIS — R2689 Other abnormalities of gait and mobility: Secondary | ICD-10-CM | POA: Diagnosis not present

## 2023-05-12 DIAGNOSIS — M545 Low back pain, unspecified: Secondary | ICD-10-CM | POA: Diagnosis not present

## 2023-05-17 ENCOUNTER — Encounter: Payer: Self-pay | Admitting: Family

## 2023-05-17 ENCOUNTER — Ambulatory Visit (INDEPENDENT_AMBULATORY_CARE_PROVIDER_SITE_OTHER): Admitting: Family

## 2023-05-17 VITALS — BP 130/74 | HR 62 | Ht 78.0 in | Wt 296.3 lb

## 2023-05-17 DIAGNOSIS — M25552 Pain in left hip: Secondary | ICD-10-CM

## 2023-05-17 NOTE — Progress Notes (Signed)
 Charles Navarro is a 73 y.o. male with the following history as recorded in EpicCare:  Patient Active Problem List   Diagnosis Date Noted   Apical variant hypertrophic cardiomyopathy (HCC) 01/28/2023   Obesity, morbid (HCC) 01/28/2023   Coronary artery disease involving native coronary artery of native heart without angina pectoris 06/24/2022   Spinal stenosis of lumbar region 04/21/2022   Pneumonia due to COVID-19 virus 02/08/2019   OSA (obstructive sleep apnea) 02/08/2019   Boil 03/06/2012   Discitis of lumbar region 01/31/2012   Diarrhea 01/31/2012   Nausea 01/31/2012   Acid reflux 01/31/2012   Weight loss, abnormal 01/31/2012    Current Outpatient Medications  Medication Sig Dispense Refill   acetaminophen  (TYLENOL ) 325 MG tablet Take 650 mg by mouth every 6 (six) hours as needed for moderate pain or headache.     aspirin  EC 81 MG tablet Take 1 tablet (81 mg total) by mouth daily. Swallow whole. 90 tablet 3   CAPSAICIN EX Apply 1 Application topically daily as needed (back pain).     cyclobenzaprine  (FLEXERIL ) 10 MG tablet Take 10 mg by mouth daily as needed for muscle spasms.     HYDROcodone -acetaminophen  (NORCO/VICODIN) 5-325 MG tablet Take 2 tablets by mouth every 4 (four) hours as needed for severe pain ((score 7 to 10)). 30 tablet 0   lidocaine  (LIDODERM ) 5 % Place onto the skin as needed (back pain).     lisinopril -hydrochlorothiazide  (ZESTORETIC ) 10-12.5 MG tablet Take 1 tablet by mouth daily.     loratadine  (CLARITIN ) 10 MG tablet Take 10 mg by mouth daily as needed for allergies.     meclizine  (ANTIVERT ) 25 MG tablet Take 25 mg by mouth 3 (three) times daily as needed for dizziness. 1 - 3 tablets as needed for dizziness     Menthol -Camphor (TIGER BALM ARTHRITIS RUB EX) Apply 1 Application topically daily as needed (pain).     Multiple Vitamin (MULTIVITAMIN WITH MINERALS) TABS Take 1 tablet by mouth daily.     Omega-3 Fatty Acids (FISH OIL) 1000 MG CAPS Take 1,000 mg by mouth  daily.     pantoprazole  (PROTONIX ) 40 MG tablet Take 40 mg by mouth daily.     Psyllium (METAMUCIL 4 IN 1 FIBER) 43 % POWD Take 1 Dose by mouth 4 (four) times a week. 1 dose = 2 teaspoons     sodium chloride  (OCEAN) 0.65 % nasal spray Place into the nose as needed for congestion.     No current facility-administered medications for this visit.    Allergies: Levaquin  [levofloxacin ], Codeine, Gabapentin, Oxycodone -acetaminophen , Tramadol, and Bactrim [sulfamethoxazole-trimethoprim]  Past Medical History:  Diagnosis Date   Bronchitis    hx of   COPD (chronic obstructive pulmonary disease) (HCC)    Depression    GERD (gastroesophageal reflux disease)    Hypertension    takes meds daily   Sleep apnea    Vertigo     Past Surgical History:  Procedure Laterality Date   COLONOSCOPY     LUMBAR LAMINECTOMY/DECOMPRESSION MICRODISCECTOMY  11/08/2011   Procedure: LUMBAR LAMINECTOMY/DECOMPRESSION MICRODISCECTOMY 1 LEVEL;  Surgeon: Ferris Hua, MD;  Location: [facility];  Service: Neurosurgery;  Laterality: Left;  left lumbar four-five decompression laminectomy microdiscectomy   LUMBAR LAMINECTOMY/DECOMPRESSION MICRODISCECTOMY Bilateral 04/21/2022   Procedure: LUMBAR TWO-THREE, LUMBAR THREE-FOUR BILATERAL LAMINECTOMY;  Surgeon: Gearl Keens, MD;  Location: [facility] OR;  Service: Neurosurgery;  Laterality: Bilateral;  3C   WISDOM TOOTH EXTRACTION      Family History  Problem Relation  Age of Onset   Alcoholism Father     Social History   Tobacco Use   Smoking status: Never   Smokeless tobacco: Never  Substance Use Topics   Alcohol use: No    Subjective:   Requesting updated referral to orthopedic surgeon; he has been struggling with pain since December 2023; was told that back was source of symptoms and met with neurosurgeon; has since been told that left hip needs to be replaced; has been to [facility] and [facility] and very frustrated with wait time for surgery; asking for referrals to be done to any  orthopedist that might be able to get his surgery scheduled immediately;  [facility]- scheduled 5/22; [facility]- scheduled 5/23; Dr. Rosamond Comes at [facility] told him it would be July/ August to get surgery done;    Objective:  Vitals:   05/17/23 1105  BP: 130/74  Pulse: 62  SpO2: 98%  Weight: 296 lb 4.8 oz (134.4 kg)  Height: 6\' 6"  (1.981 m)    General: Well developed, well nourished, in no acute distress  Skin : Warm and dry.  Head: Normocephalic and atraumatic  Lungs: Respirations unlabored;  Musculoskeletal: No deformities; no active joint inflammation  Extremities: No edema, cyanosis, clubbing  Vessels: Symmetric bilaterally  Neurologic: Alert and oriented; speech intact; face symmetrical; uses cane for stability/ support;  Assessment:  1. Pain of left hip     Plan:  Referrals are updated as requested; will try to get patient to [facility] as quickly as possible and hopefully their surgical schedule will be more flexible than some of the other providers he has seen;  Reassurance that all the providers he has seen are good and he should plan to get the surgery with whoever can do the surgery quickest for him.  No follow-ups on file.  Orders Placed This Encounter  Procedures   Ambulatory referral to Orthopedic Surgery    Referral Priority:   Routine    Referral Type:   Surgical    Referral Reason:   Specialty Services Required    Requested Specialty:   Orthopedic Surgery    Number of Visits Requested:   1   Ambulatory referral to Orthopedic Surgery    Referral Priority:   Routine    Referral Type:   Surgical    Referral Reason:   Specialty Services Required    Requested Specialty:   Orthopedic Surgery    Number of Visits Requested:   1    Requested Prescriptions    No prescriptions requested or ordered in this encounter

## 2023-05-19 ENCOUNTER — Telehealth: Payer: Self-pay

## 2023-05-19 ENCOUNTER — Other Ambulatory Visit (INDEPENDENT_AMBULATORY_CARE_PROVIDER_SITE_OTHER): Payer: Self-pay

## 2023-05-19 ENCOUNTER — Encounter: Payer: Self-pay | Admitting: Physician Assistant

## 2023-05-19 ENCOUNTER — Telehealth: Payer: Self-pay | Admitting: Family

## 2023-05-19 ENCOUNTER — Ambulatory Visit: Admitting: Orthopaedic Surgery

## 2023-05-19 DIAGNOSIS — M1612 Unilateral primary osteoarthritis, left hip: Secondary | ICD-10-CM

## 2023-05-19 NOTE — Telephone Encounter (Signed)
 Clearance form for total hip surgery have been faxed. Faxed to cardiologist and PCP. Patient aware that we must receive these forms back before surgery can be scheduled.

## 2023-05-19 NOTE — Telephone Encounter (Signed)
   Pre-operative Risk Assessment    Patient Name: Lavere Echard  DOB: 14-Nov-1950 MRN: [id number]   Date of last office visit: 06/24/22 Date of next office visit: 05/23/23   Request for Surgical Clearance    Procedure:  Left total hip   Date of Surgery:  Clearance TBD                                 Surgeon:  Edison Gore, MD  Surgeon's Group or Practice Name:  [facility]  Phone number:  [phone] Fax number:  [phone]   Type of Clearance Requested:   - Medical  - Pharmacy:  Hold Aspirin  Not indicated    Type of Anesthesia:  Not Indicated   Additional requests/questions:    Mansfield Seip   05/19/2023, 11:25 AM

## 2023-05-19 NOTE — Telephone Encounter (Signed)
   Name: Charles Navarro  DOB: 29-Aug-1950  MRN: [id number]  Primary Cardiologist: Jann Melody, MD  Chart reviewed as part of pre-operative protocol coverage. The patient has an upcoming visit scheduled with Dr. Paulita Boss on 05/23/2023 at which time clearance can be addressed in case there are any issues that would impact surgical recommendations.  Left total hip is not scheduled until TBD as below. I added preop FYI to appointment note so that provider is aware to address at time of outpatient visit.  Per office protocol the cardiology provider should forward their finalized clearance decision and recommendations regarding antiplatelet therapy to the requesting party below.    Ideally aspirin  should be continued without interruption, however if the bleeding risk is too great, aspirin  may be held for 5-7 days prior to surgery. Please resume aspirin  post operatively when it is felt to be safe from a bleeding standpoint.    I will route this message as FYI to requesting party and remove this message from the preop box as separate preop APP input not needed at this time.   Please call with any questions.  Ava Boatman, NP  05/19/2023, 11:55 AM

## 2023-05-19 NOTE — Progress Notes (Signed)
 Office Visit Note   Patient: Charles Navarro           Date of Birth: [birth year]           MRN: [id number] Visit Date: 05/19/2023              Requested by: Adra Alanis, FNP [address] [place],  [place] [address] PCP: Adra Alanis, FNP   Assessment & Plan: Visit Diagnoses:  1. Primary osteoarthritis of left hip     Plan: History of Present Illness Charles Navarro "Siegfried Dress" is a 73 year old male who presents with severe left hip pain and immobility.  He has severe left hip pain and is unable to bear weight on the left side, with no range of motion and difficulty walking. He has tried various treatments, including shots, ablation, and physical therapy, without success. Pool therapy provided temporary relief. He has not received a cortisone injection in the hip joint but had an anti-inflammatory shot with multiple medications in the buttock.  He underwent two back surgeries, the first in 2014 complicated by a MRSA infection, and a bilateral laminectomy in April 2024 for bone spurs. Since the laminectomy, he has been in pain and immobile.  He lives with his wife in [place] and has been largely immobile, spending much of his time sitting or lying down due to pain. He has gained weight over the past year and a half due to inactivity and wants to return to walking and exercise once his pain is managed.  Physical Exam MUSCULOSKELETAL: Left hip with limited range of motion and pain on flexion and rotation. SKIN: Skin normal around the hip.  Results RADIOLOGY Hip MRI: Left hip with complete cartilage loss, bone-on-bone contact, cyst formation  Assessment and Plan Severe left hip osteoarthritis Severe degenerative disease with bone-on-bone contact, cyst formation, and absent cartilage. Conservative treatments failed. Hip replacement recommended due to significant pain and limited mobility. - Proceed with left hip replacement surgery. - Obtain medical  clearance from primary care physician and cardiologist. - Discuss surgical risks, including thromboembolism and infection. - Plan for overnight hospital stay with physical therapy evaluation post-surgery. - Provide exercise and walking regimen for recovery. - Consider outpatient surgery with option for overnight stay based on condition.  Impression is severe left hip degenerative joint disease secondary to Osteoarthritis.  Imaging shows bone on bone joint space narrowing.  At this point, conservative treatments fail to provide any significant relief and the pain is severely affecting ADLs and quality of life.  Based on treatment options, the patient has elected to move forward with a hip replacement.  We have discussed the surgical risks that include but are not limited to infection, DVT, leg length discrepancy, numbness, tingling, incomplete relief of pain.  Recovery and prognosis were also reviewed.    Current anticoagulants: No antithrombotic Postop anticoagulation: Aspirin  81 mg Diabetic: No  Prior DVT/PE: No Tobacco use: No Clearances needed for surgery: PCP and cardiologist Anticipate discharge dispo: outpatient, possible overnight stay   Back surgeries Two surgeries with complications and persistent pain. Previous treatments ineffective.  Follow-Up Instructions: No follow-ups on file.   Orders:  Orders Placed This Encounter  Procedures   XR HIP UNILAT W OR W/O PELVIS 2-3 VIEWS LEFT   No orders of the defined types were placed in this encounter.   Subjective: Chief Complaint  Patient presents with   Left Hip - Pain    HPI  Review of Systems  Constitutional: Negative.   HENT: Negative.    Eyes: Negative.   Respiratory: Negative.    Cardiovascular: Negative.   Gastrointestinal: Negative.   Endocrine: Negative.   Genitourinary: Negative.   Skin: Negative.   Allergic/Immunologic: Negative.   Neurological: Negative.   Hematological: Negative.    Psychiatric/Behavioral: Negative.    All other systems reviewed and are negative.    Objective: Vital Signs: There were no vitals taken for this visit.  Physical Exam Vitals and nursing note reviewed.  Constitutional:      Appearance: He is well-developed.  HENT:     Head: Normocephalic and atraumatic.  Eyes:     Pupils: Pupils are equal, round, and reactive to light.  Pulmonary:     Effort: Pulmonary effort is normal.  Abdominal:     Palpations: Abdomen is soft.  Musculoskeletal:        General: Normal range of motion.     Cervical back: Neck supple.  Skin:    General: Skin is warm.  Neurological:     Mental Status: He is alert and oriented to person, place, and time.  Psychiatric:        Behavior: Behavior normal.        Thought Content: Thought content normal.        Judgment: Judgment normal.     Ortho Exam  Specialty Comments:  No specialty comments available.  Imaging: XR HIP UNILAT W OR W/O PELVIS 2-3 VIEWS LEFT Result Date: 05/19/2023 X-rays of the left hip show advanced degenerative joint disease with bone-on-bone joint space narrowing.  Flattening of the femoral head.  Large subchondral cystic formation.    PMFS History: Patient Active Problem List   Diagnosis Date Noted   Primary osteoarthritis of left hip 05/19/2023   Apical variant hypertrophic cardiomyopathy (HCC) 01/28/2023   Obesity, morbid (HCC) 01/28/2023   Coronary artery disease involving native coronary artery of native heart without angina pectoris 06/24/2022   Spinal stenosis of lumbar region 04/21/2022   Pneumonia due to COVID-19 virus 02/08/2019   OSA (obstructive sleep apnea) 02/08/2019   Boil 03/06/2012   Discitis of lumbar region 01/31/2012   Diarrhea 01/31/2012   Nausea 01/31/2012   Acid reflux 01/31/2012   Weight loss, abnormal 01/31/2012   Past Medical History:  Diagnosis Date   Bronchitis    hx of   COPD (chronic obstructive pulmonary disease) (HCC)    Depression     GERD (gastroesophageal reflux disease)    Hypertension    takes meds daily   Sleep apnea    Vertigo     Family History  Problem Relation Age of Onset   Alcoholism Father     Past Surgical History:  Procedure Laterality Date   COLONOSCOPY     LUMBAR LAMINECTOMY/DECOMPRESSION MICRODISCECTOMY  11/08/2011   Procedure: LUMBAR LAMINECTOMY/DECOMPRESSION MICRODISCECTOMY 1 LEVEL;  Surgeon: Ferris Hua, MD;  Location: [facility];  Service: Neurosurgery;  Laterality: Left;  left lumbar four-five decompression laminectomy microdiscectomy   LUMBAR LAMINECTOMY/DECOMPRESSION MICRODISCECTOMY Bilateral 04/21/2022   Procedure: LUMBAR TWO-THREE, LUMBAR THREE-FOUR BILATERAL LAMINECTOMY;  Surgeon: Gearl Keens, MD;  Location: [facility];  Service: Neurosurgery;  Laterality: Bilateral;  3C   WISDOM TOOTH EXTRACTION     Social History   Occupational History   Not on file  Tobacco Use   Smoking status: Never   Smokeless tobacco: Never  Vaping Use   Vaping status: Never Used  Substance and Sexual Activity   Alcohol use: No   Drug use: No  Sexual activity: Not on file

## 2023-05-19 NOTE — Telephone Encounter (Signed)
 Form has been printed and placed in PCP's basket.

## 2023-05-19 NOTE — Telephone Encounter (Signed)
 Copied from CRM [id number]. Topic: General - Other >> May 19, 2023  9:36 AM Magdalene [employer] wrote: Reason for CRM: Patient called stating that his [facility] office will be faxing a clearance sheet for surgery and he needs that form filled out and sent back as soon as possible.

## 2023-05-23 ENCOUNTER — Ambulatory Visit: Attending: Internal Medicine | Admitting: Internal Medicine

## 2023-05-23 VITALS — BP 120/60 | HR 69 | Ht 78.0 in | Wt 297.0 lb

## 2023-05-23 DIAGNOSIS — R0609 Other forms of dyspnea: Secondary | ICD-10-CM | POA: Insufficient documentation

## 2023-05-23 DIAGNOSIS — I318 Other specified diseases of pericardium: Secondary | ICD-10-CM | POA: Insufficient documentation

## 2023-05-23 DIAGNOSIS — I2510 Atherosclerotic heart disease of native coronary artery without angina pectoris: Secondary | ICD-10-CM | POA: Insufficient documentation

## 2023-05-23 DIAGNOSIS — I253 Aneurysm of heart: Secondary | ICD-10-CM | POA: Insufficient documentation

## 2023-05-23 DIAGNOSIS — I422 Other hypertrophic cardiomyopathy: Secondary | ICD-10-CM | POA: Diagnosis not present

## 2023-05-23 DIAGNOSIS — I77819 Aortic ectasia, unspecified site: Secondary | ICD-10-CM | POA: Insufficient documentation

## 2023-05-23 NOTE — Progress Notes (Signed)
 Cardiology Office Note:  .    Date:  05/23/2023  ID:  Charles Navarro, DOB December 30, 1950, MRN [id number] PCP: Adra Alanis, FNP  [facility] Providers Cardiologist:  Jann Melody, MD     CC: Pre-operative risk discussion  History of Present Illness: .    Charles Navarro is a 73 y.o. male with coronary artery disease and mild aortic dilation who presents for surgical clearance.  He is currently asymptomatic in terms of cardiac symptoms and is seeking surgical clearance for a hip replacement due to extreme pain and difficulty walking. He has been experiencing significant frustration with the process of obtaining surgical clearance and scheduling the surgery.  His past medical history includes hypertension, obstructive sleep apnea, moderate aortic dilation, a small apical aneurysm without scar or thrombus, and pericardial thickening without evidence of constrictive pericarditis. He is on aspirin  for his coronary artery disease and remains asymptomatic.  He has chronic obstructive pulmonary disease (COPD) with lung scarring and continues to smoke three packs per day. No chest pain or breathing issues are reported.  He underwent back surgery in April 2024 but continues to experience extreme pain. Various treatments including shots, ablations, and physical therapy have been tried without relief. An orthopedic surgeon has determined that he requires a hip replacement, as his left hip is severely deteriorated. He has been to multiple surgeons and is currently waiting for an appointment for hip replacement surgery, expressing significant frustration with the delays and the requirement for multiple clearances from different specialists.  Discussed the use of AI scribe software for clinical note transcription with the patient, who gave verbal consent to proceed.   Relevant histories: .  Social - Veteran ROS: As per HPI.   Studies Reviewed: .     Cardiac Studies & Procedures    ______________________________________________________________________________________________     ECHOCARDIOGRAM  ECHOCARDIOGRAM COMPLETE 04/27/2022  Narrative ECHOCARDIOGRAM REPORT    Patient Name:   Charles Navarro   Date of Exam: 04/27/2022 Medical Rec #:  [id number]     Height:       78.0 in Accession #:    [id number]    Weight:       271.0 lb Date of Birth:  09/01/1950     BSA:          2.569 m Patient Age:    71 years      BP:           118/80 mmHg Patient Gender: M             HR:           64 bpm. Exam Location:  [address]  Procedure: 2D Echo, Cardiac Doppler and Color Doppler  Indications:    I77.819 Dilated aorta  History:        Patient has no prior history of Echocardiogram examinations. Pericardial calcification, Dilated aorta, Arrythmias:LV aneurysm, Signs/Symptoms:Shortness of Breath; Risk Factors:Hypertension.  Sonographer:    Lula Sale RDCS Referring Phys: [id number] [facility] A Charne Mcbrien  IMPRESSIONS   1. Left ventricular ejection fraction, by estimation, is 60 to 65%. The left ventricle has normal function. The left ventricle has no regional wall motion abnormalities. There is moderate left ventricular hypertrophy. Left ventricular diastolic parameters are consistent with Grade I diastolic dysfunction (impaired relaxation). 2. Right ventricular systolic function is normal. The right ventricular size is normal. 3. Left atrial size was moderately dilated. 4. The mitral valve is normal in structure. No evidence of mitral valve regurgitation. No evidence of  mitral stenosis. 5. The aortic valve is tricuspid. Aortic valve regurgitation is mild. No aortic stenosis is present. 6. There is mild dilatation of the ascending aorta, measuring 41 mm. 7. The inferior vena cava is normal in size with greater than 50% respiratory variability, suggesting right atrial pressure of 3 mmHg.  FINDINGS Left Ventricle: Left ventricular ejection fraction, by estimation, is  60 to 65%. The left ventricle has normal function. The left ventricle has no regional wall motion abnormalities. The left ventricular internal cavity size was normal in size. There is moderate left ventricular hypertrophy. Left ventricular diastolic parameters are consistent with Grade I diastolic dysfunction (impaired relaxation).  Right Ventricle: The right ventricular size is normal. No increase in right ventricular wall thickness. Right ventricular systolic function is normal.  Left Atrium: Left atrial size was moderately dilated.  Right Atrium: Right atrial size was normal in size.  Pericardium: Trivial pericardial effusion is present. The pericardial effusion is posterior to the left ventricle.  Mitral Valve: The mitral valve is normal in structure. No evidence of mitral valve regurgitation. No evidence of mitral valve stenosis.  Tricuspid Valve: The tricuspid valve is normal in structure. Tricuspid valve regurgitation is trivial. No evidence of tricuspid stenosis.  Aortic Valve: The aortic valve is tricuspid. Aortic valve regurgitation is mild. No aortic stenosis is present.  Pulmonic Valve: The pulmonic valve was normal in structure. Pulmonic valve regurgitation is not visualized. No evidence of pulmonic stenosis.  Aorta: The aortic root is normal in size and structure. There is mild dilatation of the ascending aorta, measuring 41 mm.  Venous: The inferior vena cava is normal in size with greater than 50% respiratory variability, suggesting right atrial pressure of 3 mmHg.  IAS/Shunts: No atrial level shunt detected by color flow Doppler.   LEFT VENTRICLE PLAX 2D LVIDd:         3.40 cm   Diastology LVIDs:         2.30 cm   LV e' medial:    5.87 cm/s LV PW:         1.40 cm   LV E/e' medial:  10.7 LV IVS:        1.50 cm   LV e' lateral:   6.96 cm/s LVOT diam:     2.10 cm   LV E/e' lateral: 9.0 LV SV:         91 LV SV Index:   36 LVOT Area:     3.46 cm   RIGHT VENTRICLE              IVC RV S prime:     12.40 cm/s  IVC diam: 1.30 cm TAPSE (M-mode): 1.6 cm RVSP:           25.5 mmHg  LEFT ATRIUM             Index        RIGHT ATRIUM           Index LA diam:        4.40 cm 1.71 cm/m   RA Pressure: 3.00 mmHg LA Vol (A2C):   76.7 ml 29.86 ml/m  RA Area:     15.80 cm LA Vol (A4C):   74.2 ml 28.89 ml/m  RA Volume:   39.80 ml  15.49 ml/m LA Biplane Vol: 80.7 ml 31.42 ml/m AORTIC VALVE LVOT Vmax:   125.00 cm/s LVOT Vmean:  86.800 cm/s LVOT VTI:    0.264 m  AORTA Ao Root diam: 3.70 cm Ao  Asc diam:  4.10 cm  MITRAL VALVE               TRICUSPID VALVE MV Area (PHT): 2.21 cm    TR Peak grad:   22.5 mmHg MV Decel Time: 343 msec    TR Vmax:        237.00 cm/s MV E velocity: 62.90 cm/s  Estimated RAP:  3.00 mmHg MV A velocity: 94.90 cm/s  RVSP:           25.5 mmHg MV E/A ratio:  0.66 SHUNTS Systemic VTI:  0.26 m Systemic Diam: 2.10 cm  Janelle Mediate MD Electronically signed by Janelle Mediate MD Signature Date/Time: 04/27/2022/8:44:55 AM    Final      CT SCANS  CT CORONARY MORPH W/CTA COR W/SCORE 04/07/2022  Addendum 04/10/2022  9:44 AM ADDENDUM REPORT: 04/10/2022 09:42  CLINICAL DATA:  This over-read does not include interpretation of cardiac or coronary anatomy or pathology. The coronary CTA interpretation by the cardiologist is attached.  COMPARISON:  None available.  FINDINGS: No suspicious nodules, masses, or infiltrates are identified in the visualized portion of the lungs. No pleural fluid seen. Mild bilateral subpleural scarring noted with mild bilateral lower lobe peripheral bronchiectasis.  4.3 cm ascending thoracic aortic aneurysm is seen. Tiny hiatal hernia is noted. Images through the upper abdomen also show incomplete visualization of a low-attenuation lesion in the left upper quadrant between the pancreatic tail and spleen, which is suspicious for a cystic lesion.  IMPRESSION: 4.3 cm ascending thoracic aortic aneurysm.  Recommend annual imaging followup by CTA or MRA. This recommendation follows 2010 ACCF/AHA/AATS/ACR/ASA/SCA/SCAI/SIR/STS/SVM Guidelines for the Diagnosis and Management of Patients with Thoracic Aortic Disease. Circulation. 2010; 121: B147-W295. Aortic aneurysm NOS (ICD10-I71.9)  Mild bilateral lower lobe peripheral bronchiectasis.  Tiny hiatal hernia.  Incomplete visualization of a low-attenuation lesion in the left upper quadrant between the pancreatic tail and spleen, suspicious for a cystic lesion. Recommend further evaluation with abdomen pelvis CT with contrast.   Electronically Signed By: Marlyce Sine M.D. On: 04/10/2022 09:42  Narrative CLINICAL DATA:  73 year old with shortness of breath, LV apical aneurysm by OSH imaging.  EXAM: Cardiac/Coronary  CTA  TECHNIQUE: The patient was scanned on a [employer].  FINDINGS: A 120 kV prospective scan was triggered in the descending thoracic aorta at 111 HU's. Axial non-contrast 3 mm slices were carried out through the heart. The data set was analyzed on a dedicated work station and scored using the Agatson method. Gantry rotation speed was 250 msecs and collimation was .6 mm. 0.8 mg of sl NTG was given. The 3D data set was reconstructed in 5% intervals of the 67-82 % of the R-R cycle. Diastolic phases were analyzed on a dedicated work station using MPR, MIP and VRT modes. The patient received 80 cc of contrast.  Image quality: fair  Aorta: Mildly dilated ascending aorta 42 mm. No calcifications. No dissection.  Aortic Valve:  Trileaflet.  No calcifications.  Coronary Arteries:  Normal coronary origin.  Right dominance.  RCA is a large dominant artery that gives rise to PDA and PLA. There is scattered diffuse mixed plaque mild to moderate stenosis up to 50-69%. FFR distal 0.7  Left main is a large artery that gives rise to LAD and LCX arteries.  LAD is a large vessel that has scattered diffuse mixed  plaque with mild to moderate stenosis up to 50-69%.  D1-high takeoff, calcified plaque.  LCX is a non-dominant artery. There is  mixed plaque with moderate 50-69% stenosis.  OM1- small caliber vessel.  Other findings:  Normal pulmonary vein drainage into the left atrium.  Normal left atrial appendage without a thrombus.  Normal size of the pulmonary artery.  Calcified pericardium mostly along left lateral LV wall.  Apical thinning of myocardium.  Please see radiology report for non cardiac findings.  IMPRESSION: 1. Coronary calcium score of 2016. This was 79 percentile for age and sex matched control.  2. Total plaque volume (TPV) 1399 mm3 which is 84th percentile for age-and sex matched controls (calcified plaque 539 mm3; non-calcified plaque 860 mm3). TPV is extensive.  3. Normal coronary origin with right dominance.  4. Moderate diffuse CAD. Distal RCA FFR 0.7 (0.86 mid). Distal LAD FFR 0.73 (cumulative decrease).  5. Mildly dilated ascending aorta 42 mm.  6. Calcified pericardium mostly along left lateral LV wall.  7. Apical thinning of myocardium.  Electronically Signed: By: Dorothye Gathers M.D. On: 04/07/2022 15:44   CARDIAC MRI  MR CARDIAC MORPHOLOGY W WO CONTRAST 07/19/2022  Narrative CLINICAL DATA:  Clinical question of apical HCM Study assumes HCT of 43 and BSA of 2.68 m2.  EXAM: CARDIAC MRI  TECHNIQUE: The patient was scanned on a 1.5 Tesla GE magnet. A dedicated cardiac coil was used. Functional imaging was done using [employer] sequences. 2,3, and 4 chamber views were done to assess for RWMA's. Modified Simpson's rule using a short axis stack was used to calculate an ejection fraction on a dedicated work [employer]. The patient received 10 cc of Gadavist . After 10 minutes inversion recovery sequences were used to assess for infiltration and scar tissue. Flow quantification was performed 2 times during this examination with flow  quantification performed at the levels of the ascending aorta above the valve, pulmonary artery above the valve.  CONTRAST:  10 cc  of Gadavist   FINDINGS: 1. Normal left ventricular size, with LVEDD 46 mm, and LVEDVi 54 mL/m2.  No apical hypertrophy. Largest apical thickness 8 mm. Indexed apical thickness to BSA 2.98 mm/m2. Small apical aneurysm without scar or thrombus.  Normal left ventricular systolic function (LVEF =61%). There are no regional wall motion abnormalities.  Left ventricular parametric mapping notable for normal T2 signal.  Elevated ECV in the basal lateral segment.  There is no clear late gadolinium enhancement in the left ventricular myocardium confirmed in multiple views.  2. Normal right ventricular size with RVEDVI 64 mL/m2.  Normal right ventricular thickness.  Normal right ventricular systolic function (RVEF =55%). There are no regional wall motion abnormalities or aneurysms.  3.  Normal left and right atrial size.  4.  Normal size of the aortic root and pulmonary artery.  Mild ascending aortic dilation 44 mm.  5. Valve assessment:  Aortic Valve: Tri-leaflet. Trivial aortic regurgitation. Regurgitation fraction 6%.  Pulmonic Valve: No significant regurgitation.  Tricuspid Valve: Tri-leaflet. Mild regurgitation. Regurgitation fraction 9%.  Mitral Valve: No mitral stenosis. 2D Mitral valve area 7.61 cm2. Anterior mitral valve prolapse. No significant regurgitation. Regurgitation fraction 3%.  6. Pericardial thickening of the anterolateral LV pericardium. No pericardial effusion. Tagging performed without evidence of constrictive pericarditis.  7. Grossly, no extracardiac findings. Recommended dedicated study if concerned for non-cardiac pathology.  8. Breath-hold Artifacts noted. Study was done with significant free-breathing.  IMPRESSION: No evidence of apical hypertrophic cardiomyopathy.  Small apical aneurysm/thinning without  scar or thrombus.  Mild aortic dilation 44 mm.  Mitral valve prolapse without significant regurgitation.  Pericardial thickening without evidence of  constrictive pericarditis.  Gloriann Larger MD   Electronically Signed By: Gloriann Larger M.D. On: 07/19/2022 16:58   ______________________________________________________________________________________________      DIAGNOSTIC EKG: Bifascicular block, first-degree heart block (06/2022) Echocardiogram: Mild aortic dilation, small apical aneurysm without scar or thrombus, pericardial thickening without evidence of constrictive pericarditis (06/2022)  Physical Exam:    VS:  BP 120/60 (BP Location: Left Arm)   Pulse 69   Ht 6\' 6"  (1.981 m)   Wt 297 lb (134.7 kg)   SpO2 97%   BMI 34.32 kg/m    Wt Readings from Last 3 Encounters:  05/23/23 297 lb (134.7 kg)  05/17/23 296 lb 4.8 oz (134.4 kg)  01/28/23 285 lb 12.8 oz (129.6 kg)    Gen: no distress   Neck: No JVD Cardiac: No Rubs or Gallops, no murmur, RRR +2 radial pulses Respiratory: Clear to auscultation bilaterally, normal effort, normal  respiratory rate GI: Soft, nontender, non-distended  MS: No  edema;  moves all extremities Integument: Skin feels warm Neuro:  At time of evaluation, alert and oriented to person/place/time/situation  Psych: Normal affect, patient feels ok   ASSESSMENT AND PLAN: .    Coronary Artery Disease (CAD) Moderate, non-restrictive CAD with hyperlipidemia. Asymptomatic since June 2024. No current symptoms of angina or dyspnea. Deemed reasonable for surgery with awareness of risks, including potential post-procedural myocardial infarction.  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  No symptoms.  Reasonable to proceed with hip surgery; please send this to his orthopedic surgeons.  Mild Aortic Dilation Mild aortic dilation without aneurysm. Previously evaluated with echocardiogram. No need for repeat  echocardiogram before surgery. - Repeat echocardiogram this year for evaluation of mild aortic dilation; planned in June  Bifascicular Block and First Degree Heart Block Bifascicular block and first degree heart block identified on EKG. No acute issues.  - no symptoms of CHB  Hypertension - Hypertension well-controlled.  One year f/u if no issues  Gloriann Larger, MD FASE [facility] Cardiologist [facility]  [address] [place], [address] [place], [place] [address] [phone]  3:56 PM

## 2023-05-23 NOTE — Patient Instructions (Signed)
 Medication Instructions:  Your physician recommends that you continue on your current medications as directed. Please refer to the Current Medication list given to you today.  *If you need a refill on your cardiac medications before your next appointment, please call your pharmacy*  Lab Work: NONE  If you have labs (blood work) drawn today and your tests are completely normal, you will receive your results only by: MyChart Message (if you have MyChart) OR A paper copy in the mail If you have any lab test that is abnormal or we need to change your treatment, we will call you to review the results.  Testing/Procedures: NONE  Follow-Up: At [facility], you and your health needs are our priority.  As part of our continuing mission to provide you with exceptional heart care, our providers are all part of one team.  This team includes your primary Cardiologist (physician) and Advanced Practice Providers or APPs (Physician Assistants and Nurse Practitioners) who all work together to provide you with the care you need, when you need it.  Your next appointment:   12 month(s)  Provider:   Jann Melody, MD

## 2023-05-26 ENCOUNTER — Telehealth: Payer: Self-pay

## 2023-05-26 DIAGNOSIS — M1612 Unilateral primary osteoarthritis, left hip: Secondary | ICD-10-CM | POA: Diagnosis not present

## 2023-05-26 DIAGNOSIS — M25552 Pain in left hip: Secondary | ICD-10-CM | POA: Diagnosis not present

## 2023-05-26 NOTE — Telephone Encounter (Signed)
 Copied from CRM [id number]. Topic: General - Other >> May 26, 2023  3:44 PM [name] wrote: Reason for CRM: patient wants to let [name] know that he's having a hip replacement and and needs clearance letters signed from [facility], [name] and [facility] - they will be sending over the paperwork.

## 2023-05-26 NOTE — Telephone Encounter (Signed)
 Can you confirm if Pt needs a surgery clearance appt w/ you to complete this? It looks like an EKG was done at cardiology.

## 2023-05-26 NOTE — Telephone Encounter (Signed)
   Pre-operative Risk Assessment    Patient Name: Charles Navarro  DOB: [birth year] MRN: [id number]   Date of last office visit: 05/23/23  Date of next office visit: 06/29/23   Request for Surgical Clearance    Procedure:  Left total hip arthroplasty   Date of Surgery:  Clearance TBD                                 Surgeon:  Ricka Charity, MD  Surgeon's Group or Practice Name: [facility]  Phone number:  [phone] Fax number:  [phone]   Type of Clearance Requested:   - Medical  - Pharmacy:  Hold Aspirin  Not indicated    Type of Anesthesia:  Spinal   Additional requests/questions:    Mansfield Seip   05/26/2023, 4:09 PM

## 2023-05-26 NOTE — Telephone Encounter (Signed)
   Patient Name: Ryan Ogborn  DOB: 1950-10-03 MRN: [id number]  Primary Cardiologist: Jann Melody, MD  Chart reviewed as part of pre-operative protocol coverage. Given past medical history and time since last visit, based on ACC/AHA guidelines, Devontre Siedschlag is at acceptable risk for the planned procedure without further cardiovascular testing.   The patient was advised that if he develops new symptoms prior to surgery to contact our office to arrange for a follow-up visit, and he verbalized understanding.  I will route this recommendation to the requesting party via Epic fax function and remove from pre-op pool.  Please call with questions.  Francene Ing, Retha Cast, NP 05/26/2023, 5:59 PM

## 2023-05-26 NOTE — Telephone Encounter (Signed)
 Form in S drive.

## 2023-05-27 DIAGNOSIS — M1612 Unilateral primary osteoarthritis, left hip: Secondary | ICD-10-CM | POA: Diagnosis not present

## 2023-05-27 NOTE — Telephone Encounter (Signed)
 We have faxed and received confirmation for the surgical clearance form for [facility], pt is requesting further surgical clearance forms to be filled out for [facility] and [facility].

## 2023-05-31 NOTE — Telephone Encounter (Signed)
 Received another Preop clearance request.  Will send over all notes to surgeons office.

## 2023-06-01 ENCOUNTER — Telehealth: Payer: Self-pay | Admitting: Family

## 2023-06-01 NOTE — Telephone Encounter (Signed)
 Copied from CRM [id number]. Topic: Referral - Question >> Jun 01, 2023 10:13 AM Clydene Darner H wrote: Reason for CRM: Patient called stating he was recently referred for evaluation of left hip pain related to orthopedic issues. He shared that he has not received any follow-up from the referred offices regarding scheduling a left hip replacement. The patient spoke with someone from [facility] and was informed that the next available appointment is on August 19th. He expressed concern about the long wait time, given his ongoing pain, and is requesting that a new referral be sent-either a different recommendation or specifically to Dr. Ava Lei office at [facility] he spoke with the surgical scheduler Ruperto Coup from [facility] and was provided information for referral to be sent.   Callback Number [phone] Fax Number: [phone]

## 2023-06-01 NOTE — Telephone Encounter (Signed)
 Copied from CRM [id number]. Topic: Referral - Question >> Jun 01, 2023 12:01 PM Magdalene School wrote: Patient called back stating that he received a call from his orthopedic office and got the appointment scheduled. He wanted to let office know how grateful he is to be able to get this appointment for his surgery!

## 2023-06-02 NOTE — Pre-Procedure Instructions (Signed)
 Surgical Instructions   Your procedure is scheduled on June 06, 2023. Report to [facility] Main Entrance "A" at 9:25 A.M., then check in with the Admitting office. Any questions or running late day of surgery: call [phone]  Questions prior to your surgery date: call [phone], Monday-Friday, 8am-4pm. If you experience any cold or flu symptoms such as cough, fever, chills, shortness of breath, etc. between now and your scheduled surgery, please notify us  at the above number.     Remember:  Do not eat after midnight the night before your surgery  You may drink clear liquids until 8:55 AM the morning of your surgery.   Clear liquids allowed are: Water, Non-Citrus Juices (without pulp), Carbonated Beverages, Clear Tea (no milk, honey, etc.), Black Coffee Only (NO MILK, CREAM OR POWDERED CREAMER of any kind), and Gatorade.  Patient Instructions  The night before surgery:  No food after midnight. ONLY clear liquids after midnight  The day of surgery (if you do NOT have diabetes):  Drink ONE (1) Pre-Surgery Clear Ensure by 8:55 AM the morning of surgery. Drink in one sitting. Do not sip.  This drink was given to you during your hospital  pre-op appointment visit.  Nothing else to drink after completing the  Pre-Surgery Clear Ensure.         If you have questions, please contact your surgeon's office.    Take these medicines the morning of surgery with A SIP OF WATER: pantoprazole  (PROTONIX )    May take these medicines IF NEEDED: acetaminophen  (TYLENOL )  loratadine  (CLARITIN )  meclizine  (ANTIVERT )  sodium chloride  (OCEAN) nasal spray    Follow your surgeon's instructions on when to stop Aspirin .  If no instructions were given by your surgeon then you will need to call the office to get those instructions.     One week prior to surgery, STOP taking any Aleve, Naproxen, Ibuprofen , Motrin , Advil , Goody's, BC's, all herbal medications, fish oil, and non-prescription  vitamins.                     Do NOT Smoke (Tobacco/Vaping) for 24 hours prior to your procedure.  If you use a CPAP at night, you may bring your mask/headgear for your overnight stay.   You will be asked to remove any contacts, glasses, piercing's, hearing aid's, dentures/partials prior to surgery. Please bring cases for these items if needed.    Patients discharged the day of surgery will not be allowed to drive home, and someone needs to stay with them for 24 hours.  SURGICAL WAITING ROOM VISITATION Patients may have no more than 2 support people in the waiting area - these visitors may rotate.   Pre-op nurse will coordinate an appropriate time for 1 ADULT support person, who may not rotate, to accompany patient in pre-op.  Children under the age of 37 must have an adult with them who is not the patient and must remain in the main waiting area with an adult.  If the patient needs to stay at the hospital during part of their recovery, the visitor guidelines for inpatient rooms apply.  Please refer to the [facility] website for the visitor guidelines for any additional information.   If you received a COVID test during your pre-op visit  it is requested that you wear a mask when out in public, stay away from anyone that may not be feeling well and notify your surgeon if you develop symptoms. If you have been in contact with  anyone that has tested positive in the last 10 days please notify you surgeon.      Pre-operative 5 CHG Bathing Instructions   You can play a key role in reducing the risk of infection after surgery. Your skin needs to be as free of germs as possible. You can reduce the number of germs on your skin by washing with CHG (chlorhexidine  gluconate) soap before surgery. CHG is an antiseptic soap that kills germs and continues to kill germs even after washing.   DO NOT use if you have an allergy to chlorhexidine /CHG or antibacterial soaps. If your skin becomes reddened or  irritated, stop using the CHG and notify one of our RNs at [phone].   Please shower with the CHG soap starting 4 days before surgery using the following schedule:     Please keep in mind the following:  DO NOT shave, including legs and underarms, starting the day of your first shower.   You may shave your face at any point before/day of surgery.  Place clean sheets on your bed the day you start using CHG soap. Use a clean washcloth (not used since being washed) for each shower. DO NOT sleep with pets once you start using the CHG.   CHG Shower Instructions:  Wash your face and private area with normal soap. If you choose to wash your hair, wash first with your normal shampoo.  After you use shampoo/soap, rinse your hair and body thoroughly to remove shampoo/soap residue.  Turn the water OFF and apply about 3 tablespoons (45 ml) of CHG soap to a CLEAN washcloth.  Apply CHG soap ONLY FROM YOUR NECK DOWN TO YOUR TOES (washing for 3-5 minutes)  DO NOT use CHG soap on face, private areas, open wounds, or sores.  Pay special attention to the area where your surgery is being performed.  If you are having back surgery, having someone wash your back for you may be helpful. Wait 2 minutes after CHG soap is applied, then you may rinse off the CHG soap.  Pat dry with a clean towel  Put on clean clothes/pajamas   If you choose to wear lotion, please use ONLY the CHG-compatible lotions that are listed below.  Additional instructions for the day of surgery: DO NOT APPLY any lotions, deodorants, cologne, or perfumes.   Do not bring valuables to the hospital. [facility] is not responsible for any belongings/valuables. Do not wear nail polish, gel polish, artificial nails, or any other type of covering on natural nails (fingers and toes) Do not wear jewelry or makeup Put on clean/comfortable clothes.  Please brush your teeth.  Ask your nurse before applying any prescription medications to the  skin.     CHG Compatible Lotions   Aveeno Moisturizing lotion  Cetaphil Moisturizing Cream  Cetaphil Moisturizing Lotion  Clairol Herbal Essence Moisturizing Lotion, Dry Skin  Clairol Herbal Essence Moisturizing Lotion, Extra Dry Skin  Clairol Herbal Essence Moisturizing Lotion, Normal Skin  Curel Age Defying Therapeutic Moisturizing Lotion with Alpha Hydroxy  Curel Extreme Care Body Lotion  Curel Soothing Hands Moisturizing Hand Lotion  Curel Therapeutic Moisturizing Cream, Fragrance-Free  Curel Therapeutic Moisturizing Lotion, Fragrance-Free  Curel Therapeutic Moisturizing Lotion, Original Formula  Eucerin Daily Replenishing Lotion  Eucerin Dry Skin Therapy Plus Alpha Hydroxy Crme  Eucerin Dry Skin Therapy Plus Alpha Hydroxy Lotion  Eucerin Original Crme  Eucerin Original Lotion  Eucerin Plus Crme Eucerin Plus Lotion  Eucerin TriLipid Replenishing Lotion  Keri Anti-Bacterial Hand Lotion  Keri Deep Conditioning Original Lotion Dry Skin Formula Softly Scented  Keri Deep Conditioning Original Lotion, Fragrance Free Sensitive Skin Formula  Keri Lotion Fast Absorbing Fragrance Free Sensitive Skin Formula  Keri Lotion Fast Absorbing Softly Scented Dry Skin Formula  Keri Original Lotion  Keri Skin Renewal Lotion Keri Silky Smooth Lotion  Keri Silky Smooth Sensitive Skin Lotion  Nivea Body Creamy Conditioning Oil  Nivea Body Extra Enriched Lotion  Nivea Body Original Lotion  Nivea Body Sheer Moisturizing Lotion Nivea Crme  Nivea Skin Firming Lotion  NutraDerm 30 Skin Lotion  NutraDerm Skin Lotion  NutraDerm Therapeutic Skin Cream  NutraDerm Therapeutic Skin Lotion  ProShield Protective Hand Cream  Provon moisturizing lotion  Please read over the following fact sheets that you were given.

## 2023-06-03 ENCOUNTER — Encounter (HOSPITAL_COMMUNITY)
Admission: RE | Admit: 2023-06-03 | Discharge: 2023-06-03 | Disposition: A | Discharge status: Home / Self Care | Source: Ambulatory Visit | Attending: Orthopaedic Surgery | Admitting: Orthopaedic Surgery

## 2023-06-03 ENCOUNTER — Encounter (HOSPITAL_COMMUNITY): Payer: Self-pay

## 2023-06-03 ENCOUNTER — Other Ambulatory Visit: Payer: Self-pay

## 2023-06-03 ENCOUNTER — Other Ambulatory Visit: Payer: Self-pay | Admitting: Family

## 2023-06-03 VITALS — BP 114/75 | HR 69 | Temp 97.9°F | Resp 19 | Ht 78.0 in | Wt 294.2 lb

## 2023-06-03 DIAGNOSIS — I2510 Atherosclerotic heart disease of native coronary artery without angina pectoris: Secondary | ICD-10-CM | POA: Diagnosis not present

## 2023-06-03 DIAGNOSIS — I10 Essential (primary) hypertension: Secondary | ICD-10-CM | POA: Insufficient documentation

## 2023-06-03 DIAGNOSIS — E785 Hyperlipidemia, unspecified: Secondary | ICD-10-CM | POA: Diagnosis not present

## 2023-06-03 DIAGNOSIS — G4733 Obstructive sleep apnea (adult) (pediatric): Secondary | ICD-10-CM | POA: Diagnosis not present

## 2023-06-03 DIAGNOSIS — Z01812 Encounter for preprocedural laboratory examination: Secondary | ICD-10-CM | POA: Insufficient documentation

## 2023-06-03 DIAGNOSIS — K219 Gastro-esophageal reflux disease without esophagitis: Secondary | ICD-10-CM | POA: Diagnosis not present

## 2023-06-03 DIAGNOSIS — Z01818 Encounter for other preprocedural examination: Secondary | ICD-10-CM

## 2023-06-03 HISTORY — DX: Unilateral primary osteoarthritis, left hip: M16.12

## 2023-06-03 HISTORY — DX: Atrioventricular block, first degree: I44.0

## 2023-06-03 HISTORY — DX: Aortic ectasia, unspecified site: I77.819

## 2023-06-03 HISTORY — DX: Bifascicular block: I45.2

## 2023-06-03 HISTORY — DX: Atherosclerotic heart disease of native coronary artery without angina pectoris: I25.10

## 2023-06-03 LAB — BASIC METABOLIC PANEL WITH GFR
Anion gap: 11 (ref 5–15)
BUN: 15 mg/dL (ref 8–23)
CO2: 26 mmol/L (ref 22–32)
Calcium: 9.9 mg/dL (ref 8.9–10.3)
Chloride: 100 mmol/L (ref 98–111)
Creatinine, Ser: 1.28 mg/dL — ABNORMAL HIGH (ref 0.61–1.24)
GFR, Estimated: 59 mL/min — ABNORMAL LOW (ref >60)
Glucose, Bld: 90 mg/dL (ref 70–99)
Potassium: 4 mmol/L (ref 3.5–5.1)
Sodium: 137 mmol/L (ref 135–145)

## 2023-06-03 LAB — CBC
HCT: 47.9 % (ref 39.0–52.0)
Hemoglobin: 16.6 g/dL (ref 13.0–17.0)
MCH: 31.4 pg (ref 26.0–34.0)
MCHC: 34.7 g/dL (ref 30.0–36.0)
MCV: 90.5 fL (ref 80.0–100.0)
Platelets: 153 10*3/uL (ref 150–400)
RBC: 5.29 MIL/uL (ref 4.22–5.81)
RDW: 13.4 % (ref 11.5–15.5)
WBC: 8 10*3/uL (ref 4.0–10.5)
nRBC: 0 % (ref 0.0–0.2)

## 2023-06-03 LAB — TYPE AND SCREEN
ABO/RH(D): O POS
Antibody Screen: NEGATIVE

## 2023-06-03 LAB — SURGICAL PCR SCREEN
MRSA, PCR: NEGATIVE
Staphylococcus aureus: NEGATIVE

## 2023-06-03 NOTE — Anesthesia Preprocedure Evaluation (Addendum)
 Anesthesia Evaluation  Patient identified by MRN, date of birth, ID band Patient awake    Reviewed: Allergy & Precautions, H&P , NPO status , Patient's Chart, lab work & pertinent test results  Airway Mallampati: IV  TM Distance: >3 FB Neck ROM: Full    Dental  (+) Teeth Intact, Dental Advisory Given   Pulmonary sleep apnea (noncompliant w/ cpap)    Pulmonary exam normal breath sounds clear to auscultation       Cardiovascular hypertension (127/76 preop), Pt. on medications + CAD (moderate nonobstructive CAD by CTA 04/2022)  Normal cardiovascular exam+ dysrhythmias (bifascicular block) + Valvular Problems/Murmurs (mild AI) AI  Rhythm:Regular Rate:Normal  Echo 2024  1. Left ventricular ejection fraction, by estimation, is 60 to 65%. The  left ventricle has normal function. The left ventricle has no regional  wall motion abnormalities. There is moderate left ventricular hypertrophy.  Left ventricular diastolic  parameters are consistent with Grade I diastolic dysfunction (impaired  relaxation).   2. Right ventricular systolic function is normal. The right ventricular  size is normal.   3. Left atrial size was moderately dilated.   4. The mitral valve is normal in structure. No evidence of mitral valve  regurgitation. No evidence of mitral stenosis.   5. The aortic valve is tricuspid. Aortic valve regurgitation is mild. No  aortic stenosis is present.   6. There is mild dilatation of the ascending aorta, measuring 41 mm.   7. The inferior vena cava is normal in size with greater than 50%  respiratory variability, suggesting right atrial pressure of 3 mmHg.      Neuro/Psych  PSYCHIATRIC DISORDERS  Depression    negative neurological ROS     GI/Hepatic Neg liver ROS,GERD  Medicated and Controlled,,  Endo/Other  negative endocrine ROS  BMI 33  Renal/GU Renal diseaseCr 1.28  negative genitourinary   Musculoskeletal  (+)  Arthritis , Osteoarthritis,  Lumbar surgery x 2: L2-5   Abdominal  (+) + obese  Peds negative pediatric ROS (+)  Hematology negative hematology ROS (+) Hb 16.6, plt 153   Anesthesia Other Findings   Reproductive/Obstetrics negative OB ROS                             Anesthesia Physical Anesthesia Plan  ASA: 3  Anesthesia Plan: MAC and Spinal   Post-op Pain Management: Tylenol  PO (pre-op)*   Induction:   PONV Risk Score and Plan: 2 and Propofol  infusion and TIVA  Airway Management Planned: Natural Airway and Simple Face Mask  Additional Equipment: None  Intra-op Plan:   Post-operative Plan:   Informed Consent: I have reviewed the patients History and Physical, chart, labs and discussed the procedure including the risks, benefits and alternatives for the proposed anesthesia with the patient or authorized representative who has indicated his/her understanding and acceptance.       Plan Discussed with: CRNA  Anesthesia Plan Comments:         Anesthesia Quick Evaluation

## 2023-06-03 NOTE — Progress Notes (Signed)
 PCP - Glade Lambert, NP Cardiologist - Dr. Gloriann Larger  PPM/ICD - denies   Chest x-ray - 06/13/19 EKG - 05/23/23 Stress Test - denies ECHO - 04/27/22 Cardiac Cath - denies  Sleep Study - OSA+ CPAP - denies  DM- denies  Last dose of GLP1 agonist-  n/a   ASA/Blood Thinner Instructions: Pt was told to hold ASA 5-7 days. Last dose 5/28.   ERAS Protcol - clears until 0855 PRE-SURGERY Ensure given  COVID TEST- n/a   Anesthesia review: yes, cardiac hx  Patient denies shortness of breath, fever, cough and chest pain at PAT appointment   All instructions explained to the patient, with a verbal understanding of the material. Patient agrees to go over the instructions while at home for a better understanding.  The opportunity to ask questions was provided.

## 2023-06-03 NOTE — Progress Notes (Signed)
 Anesthesia Chart Review:  73 yo male follows with cardiology for hx of HTN, bifascicular block, mild aortic dilation, moderate nonobstructive CAD by CTA 04/2022. Seen by Dr. Paulita Boss 06/02/23 for preop eval. Per note, "Moderate, non-restrictive CAD with hyperlipidemia. Asymptomatic since June 2024. No current symptoms of angina or dyspnea. Deemed reasonable for surgery with awareness of risks, including potential post-procedural myocardial infarction.  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  No symptoms.  Reasonable to proceed with hip surgery; please send this to his orthopedic surgeons"  Other pertinent hx includes OSA not on CPAP, GERD on PPI.  Preop labs reviewed, creatinine mildly elevated 1.28, otherwise unremarkable.  EKG 05/23/23: Normal sinus rhythm. Rate 69. Incomplete right bundle branch block. Left anterior fascicular block. Possible Lateral infarct , age undetermined. Cannot rule out Inferior infarct , age undetermined  Cardiac MRI 07/19/22:  IMPRESSION: No evidence of apical hypertrophic cardiomyopathy.   Small apical aneurysm/thinning without scar or thrombus.   Mild aortic dilation 44 mm.   Mitral valve prolapse without significant regurgitation.   Pericardial thickening without evidence of constrictive pericarditis.   TTE 04/27/22:  1. Left ventricular ejection fraction, by estimation, is 60 to 65%. The  left ventricle has normal function. The left ventricle has no regional  wall motion abnormalities. There is moderate left ventricular hypertrophy.  Left ventricular diastolic  parameters are consistent with Grade I diastolic dysfunction (impaired  relaxation).   2. Right ventricular systolic function is normal. The right ventricular  size is normal.   3. Left atrial size was moderately dilated.   4. The mitral valve is normal in structure. No evidence of mitral valve  regurgitation. No evidence of mitral stenosis.   5. The aortic  valve is tricuspid. Aortic valve regurgitation is mild. No  aortic stenosis is present.   6. There is mild dilatation of the ascending aorta, measuring 41 mm.   7. The inferior vena cava is normal in size with greater than 50%  respiratory variability, suggesting right atrial pressure of 3 mmHg.   Coronary CTA with FFR analysis 04/07/22: IMPRESSION: 1. Coronary calcium score of 2016. This was 83 percentile for age and sex matched control.   2. Total plaque volume (TPV) 1399 mm3 which is 84th percentile for age-and sex matched controls (calcified plaque 539 mm3; non-calcified plaque 860 mm3). TPV is extensive.   3. Normal coronary origin with right dominance.   4. Moderate diffuse CAD. Distal RCA FFR 0.7 (0.86 mid). Distal LAD FFR 0.73 (cumulative decrease).   5. Mildly dilated ascending aorta 42 mm.   6. Calcified pericardium mostly along left lateral LV wall.   7. Apical thinning of myocardium.    [facility] [place] Short Stay Center/Anesthesiology Phone [phone] 06/03/2023 4:04 PM

## 2023-06-03 NOTE — Pre-Procedure Instructions (Addendum)
 Surgical Instructions   Your procedure is scheduled on June 06, 2023. Report [facility] Main Entrance "A" at 06:15 A.M., then check in with the Admitting office. Any questions or running late day of surgery: call [phone]  Questions prior to your surgery date: call [phone], Monday-Friday, 8am-4pm. If you experience any cold or flu symptoms such as cough, fever, chills, shortness of breath, etc. between now and your scheduled surgery, please notify us  at the above number.     Remember:  Do not eat after midnight the night before your surgery  You may drink clear liquids until 05:45 AM the morning of your surgery.   Clear liquids allowed are: Water, Non-Citrus Juices (without pulp), Carbonated Beverages, Clear Tea (no milk, honey, etc.), Black Coffee Only (NO MILK, CREAM OR POWDERED CREAMER of any kind), and Gatorade.  Patient Instructions  The night before surgery:  No food after midnight. ONLY clear liquids after midnight  The day of surgery (if you do NOT have diabetes):  Drink ONE (1) Pre-Surgery Clear Ensure by 05:45 AM the morning of surgery. Drink in one sitting. Do not sip.  This drink was given to you during your hospital  pre-op appointment visit.  Nothing else to drink after completing the  Pre-Surgery Clear Ensure.         If you have questions, please contact your surgeon's office.    Take these medicines the morning of surgery with A SIP OF WATER: pantoprazole  (PROTONIX )    May take these medicines IF NEEDED: acetaminophen  (TYLENOL )  loratadine  (CLARITIN )  meclizine  (ANTIVERT )  sodium chloride  (OCEAN) nasal spray    Follow your surgeon's instructions on when to stop Aspirin .  If no instructions were given by your surgeon then you will need to call the office to get those instructions.     One week prior to surgery, STOP taking any Aleve, Naproxen, Ibuprofen , Motrin , Advil , Goody's, BC's, all herbal medications, fish oil, and non-prescription  vitamins.                     Do NOT Smoke (Tobacco/Vaping) for 24 hours prior to your procedure.  If you use a CPAP at night, you may bring your mask/headgear for your overnight stay.   You will be asked to remove any contacts, glasses, piercing's, hearing aid's, dentures/partials prior to surgery. Please bring cases for these items if needed.    Patients discharged the day of surgery will not be allowed to drive home, and someone needs to stay with them for 24 hours.  SURGICAL WAITING ROOM VISITATION Patients may have no more than 2 support people in the waiting area - these visitors may rotate.   Pre-op nurse will coordinate an appropriate time for 1 ADULT support person, who may not rotate, to accompany patient in pre-op.  Children under the age of 27 must have an adult with them who is not the patient and must remain in the main waiting area with an adult.  If the patient needs to stay at the hospital during part of their recovery, the visitor guidelines for inpatient rooms apply.  Please refer to the [facility] website for the visitor guidelines for any additional information.   If you received a COVID test during your pre-op visit  it is requested that you wear a mask when out in public, stay away from anyone that may not be feeling well and notify your surgeon if you develop symptoms. If you have been in contact with  anyone that has tested positive in the last 10 days please notify you surgeon.      Pre-operative 5 CHG Bathing Instructions   You can play a key role in reducing the risk of infection after surgery. Your skin needs to be as free of germs as possible. You can reduce the number of germs on your skin by washing with CHG (chlorhexidine  gluconate) soap before surgery. CHG is an antiseptic soap that kills germs and continues to kill germs even after washing.   DO NOT use if you have an allergy to chlorhexidine /CHG or antibacterial soaps. If your skin becomes reddened or  irritated, stop using the CHG and notify one of our RNs at [phone].   Please shower with the CHG soap starting 4 days before surgery using the following schedule:     Please keep in mind the following:  DO NOT shave, including legs and underarms, starting the day of your first shower.   You may shave your face at any point before/day of surgery.  Place clean sheets on your bed the day you start using CHG soap. Use a clean washcloth (not used since being washed) for each shower. DO NOT sleep with pets once you start using the CHG.   CHG Shower Instructions:  Wash your face and private area with normal soap. If you choose to wash your hair, wash first with your normal shampoo.  After you use shampoo/soap, rinse your hair and body thoroughly to remove shampoo/soap residue.  Turn the water OFF and apply about 3 tablespoons (45 ml) of CHG soap to a CLEAN washcloth.  Apply CHG soap ONLY FROM YOUR NECK DOWN TO YOUR TOES (washing for 3-5 minutes)  DO NOT use CHG soap on face, private areas, open wounds, or sores.  Pay special attention to the area where your surgery is being performed.  If you are having back surgery, having someone wash your back for you may be helpful. Wait 2 minutes after CHG soap is applied, then you may rinse off the CHG soap.  Pat dry with a clean towel  Put on clean clothes/pajamas   If you choose to wear lotion, please use ONLY the CHG-compatible lotions that are listed below.  Additional instructions for the day of surgery: DO NOT APPLY any lotions, deodorants, cologne, or perfumes.   Do not bring valuables to the hospital. [facility] is not responsible for any belongings/valuables. Do not wear nail polish, gel polish, artificial nails, or any other type of covering on natural nails (fingers and toes) Do not wear jewelry or makeup Put on clean/comfortable clothes.  Please brush your teeth.  Ask your nurse before applying any prescription medications to the  skin.     CHG Compatible Lotions   Aveeno Moisturizing lotion  Cetaphil Moisturizing Cream  Cetaphil Moisturizing Lotion  Clairol Herbal Essence Moisturizing Lotion, Dry Skin  Clairol Herbal Essence Moisturizing Lotion, Extra Dry Skin  Clairol Herbal Essence Moisturizing Lotion, Normal Skin  Curel Age Defying Therapeutic Moisturizing Lotion with Alpha Hydroxy  Curel Extreme Care Body Lotion  Curel Soothing Hands Moisturizing Hand Lotion  Curel Therapeutic Moisturizing Cream, Fragrance-Free  Curel Therapeutic Moisturizing Lotion, Fragrance-Free  Curel Therapeutic Moisturizing Lotion, Original Formula  Eucerin Daily Replenishing Lotion  Eucerin Dry Skin Therapy Plus Alpha Hydroxy Crme  Eucerin Dry Skin Therapy Plus Alpha Hydroxy Lotion  Eucerin Original Crme  Eucerin Original Lotion  Eucerin Plus Crme Eucerin Plus Lotion  Eucerin TriLipid Replenishing Lotion  Keri Anti-Bacterial Hand Lotion  Keri Deep Conditioning Original Lotion Dry Skin Formula Softly Scented  Keri Deep Conditioning Original Lotion, Fragrance Free Sensitive Skin Formula  Keri Lotion Fast Absorbing Fragrance Free Sensitive Skin Formula  Keri Lotion Fast Absorbing Softly Scented Dry Skin Formula  Keri Original Lotion  Keri Skin Renewal Lotion Keri Silky Smooth Lotion  Keri Silky Smooth Sensitive Skin Lotion  Nivea Body Creamy Conditioning Oil  Nivea Body Extra Enriched Lotion  Nivea Body Original Lotion  Nivea Body Sheer Moisturizing Lotion Nivea Crme  Nivea Skin Firming Lotion  NutraDerm 30 Skin Lotion  NutraDerm Skin Lotion  NutraDerm Therapeutic Skin Cream  NutraDerm Therapeutic Skin Lotion  ProShield Protective Hand Cream  Provon moisturizing lotion  Please read over the following fact sheets that you were given.

## 2023-06-05 ENCOUNTER — Telehealth: Payer: Self-pay | Admitting: *Deleted

## 2023-06-05 NOTE — Telephone Encounter (Signed)
 [facility] RNCM pre-op  call completed.

## 2023-06-05 NOTE — Care Plan (Signed)
 [facility] RNCM call to patient to discuss his upcoming Left total hip arthroplasty with Dr. Christiane Cowing at [facility] on 06/06/23. He is agreeable to case management. He lives in an apartment with his wife and is anticipating going home possibly same day from surgery with assistance from her and his son/daughter. He will need a RW. Anticipate HHPT will be needed after very short stay in hospital. Referral made to [facility] after choice provided. Reviewed all post op care instructions. Will continue to follow for needs.

## 2023-06-06 ENCOUNTER — Other Ambulatory Visit (HOSPITAL_COMMUNITY): Payer: Self-pay

## 2023-06-06 ENCOUNTER — Ambulatory Visit (HOSPITAL_COMMUNITY): Admitting: Anesthesiology

## 2023-06-06 ENCOUNTER — Encounter (HOSPITAL_COMMUNITY): Payer: Self-pay | Admitting: Orthopaedic Surgery

## 2023-06-06 ENCOUNTER — Ambulatory Visit (HOSPITAL_COMMUNITY)
Admission: RE | Admit: 2023-06-06 | Discharge: 2023-06-06 | Disposition: A | Discharge status: Home / Self Care | Attending: Orthopaedic Surgery | Admitting: Orthopaedic Surgery

## 2023-06-06 ENCOUNTER — Ambulatory Visit (HOSPITAL_COMMUNITY)

## 2023-06-06 ENCOUNTER — Encounter (HOSPITAL_COMMUNITY)
Admission: RE | Disposition: A | Discharge status: Home / Self Care | Payer: Self-pay | Source: Home / Self Care | Attending: Orthopaedic Surgery

## 2023-06-06 ENCOUNTER — Ambulatory Visit (HOSPITAL_COMMUNITY): Payer: Self-pay | Admitting: Physician Assistant

## 2023-06-06 ENCOUNTER — Other Ambulatory Visit: Payer: Self-pay

## 2023-06-06 ENCOUNTER — Other Ambulatory Visit: Payer: Self-pay | Admitting: Physician Assistant

## 2023-06-06 DIAGNOSIS — G4730 Sleep apnea, unspecified: Secondary | ICD-10-CM | POA: Insufficient documentation

## 2023-06-06 DIAGNOSIS — I2510 Atherosclerotic heart disease of native coronary artery without angina pectoris: Secondary | ICD-10-CM | POA: Insufficient documentation

## 2023-06-06 DIAGNOSIS — F32A Depression, unspecified: Secondary | ICD-10-CM | POA: Insufficient documentation

## 2023-06-06 DIAGNOSIS — I351 Nonrheumatic aortic (valve) insufficiency: Secondary | ICD-10-CM | POA: Diagnosis not present

## 2023-06-06 DIAGNOSIS — I452 Bifascicular block: Secondary | ICD-10-CM | POA: Insufficient documentation

## 2023-06-06 DIAGNOSIS — Z79899 Other long term (current) drug therapy: Secondary | ICD-10-CM | POA: Insufficient documentation

## 2023-06-06 DIAGNOSIS — I517 Cardiomegaly: Secondary | ICD-10-CM | POA: Insufficient documentation

## 2023-06-06 DIAGNOSIS — E669 Obesity, unspecified: Secondary | ICD-10-CM | POA: Diagnosis not present

## 2023-06-06 DIAGNOSIS — Z96642 Presence of left artificial hip joint: Secondary | ICD-10-CM | POA: Diagnosis not present

## 2023-06-06 DIAGNOSIS — Z471 Aftercare following joint replacement surgery: Secondary | ICD-10-CM | POA: Diagnosis not present

## 2023-06-06 DIAGNOSIS — G4733 Obstructive sleep apnea (adult) (pediatric): Secondary | ICD-10-CM

## 2023-06-06 DIAGNOSIS — K219 Gastro-esophageal reflux disease without esophagitis: Secondary | ICD-10-CM | POA: Insufficient documentation

## 2023-06-06 DIAGNOSIS — I10 Essential (primary) hypertension: Secondary | ICD-10-CM | POA: Diagnosis not present

## 2023-06-06 DIAGNOSIS — M1612 Unilateral primary osteoarthritis, left hip: Secondary | ICD-10-CM | POA: Diagnosis present

## 2023-06-06 DIAGNOSIS — Z7982 Long term (current) use of aspirin: Secondary | ICD-10-CM | POA: Diagnosis not present

## 2023-06-06 DIAGNOSIS — M1990 Unspecified osteoarthritis, unspecified site: Secondary | ICD-10-CM | POA: Diagnosis not present

## 2023-06-06 DIAGNOSIS — Z6832 Body mass index (BMI) 32.0-32.9, adult: Secondary | ICD-10-CM | POA: Diagnosis not present

## 2023-06-06 HISTORY — PX: TOTAL HIP ARTHROPLASTY: SHX124

## 2023-06-06 LAB — ABO/RH: ABO/RH(D): O POS

## 2023-06-06 SURGERY — ARTHROPLASTY, HIP, TOTAL, ANTERIOR APPROACH
Anesthesia: Monitor Anesthesia Care | Site: Hip | Laterality: Left

## 2023-06-06 MED ORDER — TRANEXAMIC ACID 1000 MG/10ML IV SOLN
INTRAVENOUS | Status: DC | PRN
  Administered 2023-06-06: 2000 mg via TOPICAL

## 2023-06-06 MED ORDER — ASPIRIN 81 MG PO CHEW: 81.0000 mg | CHEWABLE_TABLET | Freq: Two times a day (BID) | ORAL | 0 refills | Status: DC

## 2023-06-06 MED ORDER — DOCUSATE SODIUM 100 MG PO CAPS: 100.0000 mg | ORAL_CAPSULE | Freq: Every day | ORAL | 2 refills | Status: DC | PRN

## 2023-06-06 MED ORDER — DEXAMETHASONE SODIUM PHOSPHATE 10 MG/ML IJ SOLN
INTRAMUSCULAR | Status: DC | PRN
  Administered 2023-06-06: 10 mg via INTRAVENOUS

## 2023-06-06 MED ORDER — DOCUSATE SODIUM 100 MG PO CAPS: 100.0000 mg | ORAL_CAPSULE | Freq: Two times a day (BID) | ORAL | Status: DC

## 2023-06-06 MED ORDER — ACETAMINOPHEN 500 MG PO TABS
1000.0000 mg | ORAL_TABLET | Freq: Four times a day (QID) | ORAL | Status: DC
Start: 2023-06-06 — End: 2023-06-06

## 2023-06-06 MED ORDER — HYDROCODONE-ACETAMINOPHEN 5-325 MG PO TABS: 1.0000 | ORAL_TABLET | Freq: Four times a day (QID) | ORAL | 0 refills | Status: DC | PRN

## 2023-06-06 MED ORDER — VANCOMYCIN HCL 1 G IV SOLR
INTRAVENOUS | Status: DC | PRN
  Administered 2023-06-06: 1000 mg

## 2023-06-06 MED ORDER — ACETAMINOPHEN 500 MG PO TABS
1000.0000 mg | ORAL_TABLET | Freq: Once | ORAL | Status: AC
  Administered 2023-06-06: 1000 mg via ORAL
  Filled 2023-06-06: qty 2

## 2023-06-06 MED ORDER — TRANEXAMIC ACID 1000 MG/10ML IV SOLN
2000.0000 mg | INTRAVENOUS | Status: DC
  Filled 2023-06-06: qty 20

## 2023-06-06 MED ORDER — TRANEXAMIC ACID-NACL 1000-0.7 MG/100ML-% IV SOLN
INTRAVENOUS | Status: AC
  Filled 2023-06-06: qty 100

## 2023-06-06 MED ORDER — HYDROCODONE-ACETAMINOPHEN 7.5-325 MG PO TABS: 1.0000 | ORAL_TABLET | Freq: Once | ORAL | Status: DC | PRN

## 2023-06-06 MED ORDER — METHOCARBAMOL 750 MG PO TABS: 750.0000 mg | ORAL_TABLET | Freq: Three times a day (TID) | ORAL | 2 refills | Status: DC | PRN

## 2023-06-06 MED ORDER — PANTOPRAZOLE SODIUM 40 MG PO TBEC: 40.0000 mg | DELAYED_RELEASE_TABLET | Freq: Every day | ORAL | Status: DC

## 2023-06-06 MED ORDER — MIDAZOLAM HCL 2 MG/2ML IJ SOLN
INTRAMUSCULAR | Status: DC | PRN
  Administered 2023-06-06: 2 mg via INTRAVENOUS

## 2023-06-06 MED ORDER — METHOCARBAMOL 1000 MG/10ML IJ SOLN: 500.0000 mg | Freq: Four times a day (QID) | INTRAMUSCULAR | Status: DC | PRN

## 2023-06-06 MED ORDER — ONDANSETRON HCL 4 MG/2ML IJ SOLN
INTRAMUSCULAR | Status: DC | PRN
  Administered 2023-06-06: 4 mg via INTRAVENOUS

## 2023-06-06 MED ORDER — BUPIVACAINE IN DEXTROSE 0.75-8.25 % IT SOLN
INTRATHECAL | Status: DC | PRN
  Administered 2023-06-06: 1.8 mL via INTRATHECAL

## 2023-06-06 MED ORDER — CEFAZOLIN SODIUM-DEXTROSE 2-4 GM/100ML-% IV SOLN
INTRAVENOUS | Status: AC
  Filled 2023-06-06: qty 100

## 2023-06-06 MED ORDER — FENTANYL CITRATE (PF) 250 MCG/5ML IJ SOLN
INTRAMUSCULAR | Status: DC | PRN
  Administered 2023-06-06: 25 ug via INTRAVENOUS

## 2023-06-06 MED ORDER — METOCLOPRAMIDE HCL 5 MG PO TABS: 5.0000 mg | ORAL_TABLET | Freq: Three times a day (TID) | ORAL | Status: DC | PRN

## 2023-06-06 MED ORDER — ONDANSETRON HCL 4 MG/2ML IJ SOLN: 4.0000 mg | Freq: Once | INTRAMUSCULAR | Status: DC | PRN

## 2023-06-06 MED ORDER — PRONTOSAN WOUND IRRIGATION OPTIME
TOPICAL | Status: DC | PRN
  Administered 2023-06-06: 1

## 2023-06-06 MED ORDER — CHLORHEXIDINE GLUCONATE 0.12 % MT SOLN
15.0000 mL | Freq: Once | OROMUCOSAL | Status: AC
  Administered 2023-06-06: 15 mL via OROMUCOSAL
  Filled 2023-06-06: qty 15

## 2023-06-06 MED ORDER — MENTHOL 3 MG MT LOZG: 1.0000 | LOZENGE | OROMUCOSAL | Status: DC | PRN

## 2023-06-06 MED ORDER — ONDANSETRON HCL 4 MG PO TABS: 4.0000 mg | ORAL_TABLET | Freq: Three times a day (TID) | ORAL | 0 refills | Status: DC | PRN

## 2023-06-06 MED ORDER — ALUM & MAG HYDROXIDE-SIMETH 200-200-20 MG/5ML PO SUSP: 30.0000 mL | ORAL | Status: DC | PRN

## 2023-06-06 MED ORDER — METHOCARBAMOL 500 MG PO TABS: 500.0000 mg | ORAL_TABLET | Freq: Four times a day (QID) | ORAL | Status: DC | PRN

## 2023-06-06 MED ORDER — ASPIRIN 81 MG PO CHEW: 81.0000 mg | CHEWABLE_TABLET | Freq: Two times a day (BID) | ORAL | Status: DC

## 2023-06-06 MED ORDER — AMISULPRIDE (ANTIEMETIC) 5 MG/2ML IV SOLN: 10.0000 mg | Freq: Once | INTRAVENOUS | Status: DC | PRN

## 2023-06-06 MED ORDER — LACTATED RINGERS IV BOLUS: 500.0000 mL | Freq: Once | INTRAVENOUS | Status: DC

## 2023-06-06 MED ORDER — HYDROMORPHONE HCL 1 MG/ML IJ SOLN: 0.2500 mg | INTRAMUSCULAR | Status: DC | PRN

## 2023-06-06 MED ORDER — MAGNESIUM CITRATE PO SOLN: 1.0000 | Freq: Once | ORAL | Status: DC | PRN

## 2023-06-06 MED ORDER — ORAL CARE MOUTH RINSE: 15.0000 mL | Freq: Once | OROMUCOSAL | Status: AC

## 2023-06-06 MED ORDER — DEXAMETHASONE SODIUM PHOSPHATE 10 MG/ML IJ SOLN: 10.0000 mg | Freq: Once | INTRAMUSCULAR | Status: DC

## 2023-06-06 MED ORDER — PHENYLEPHRINE HCL-NACL 20-0.9 MG/250ML-% IV SOLN
INTRAVENOUS | Status: DC | PRN
  Administered 2023-06-06: 40 ug/min via INTRAVENOUS

## 2023-06-06 MED ORDER — FENTANYL CITRATE (PF) 250 MCG/5ML IJ SOLN
INTRAMUSCULAR | Status: AC
  Filled 2023-06-06: qty 5

## 2023-06-06 MED ORDER — ONDANSETRON HCL 4 MG/2ML IJ SOLN: 4.0000 mg | Freq: Four times a day (QID) | INTRAMUSCULAR | Status: DC | PRN

## 2023-06-06 MED ORDER — CEFAZOLIN SODIUM-DEXTROSE 2-4 GM/100ML-% IV SOLN
2.0000 g | INTRAVENOUS | Status: AC
  Administered 2023-06-06: 3 g via INTRAVENOUS

## 2023-06-06 MED ORDER — 0.9 % SODIUM CHLORIDE (POUR BTL) OPTIME
TOPICAL | Status: DC | PRN
  Administered 2023-06-06: 1000 mL

## 2023-06-06 MED ORDER — LIDOCAINE 2% (20 MG/ML) 5 ML SYRINGE
INTRAMUSCULAR | Status: AC
Start: 2023-06-06 — End: ?
  Filled 2023-06-06: qty 5

## 2023-06-06 MED ORDER — TRANEXAMIC ACID-NACL 1000-0.7 MG/100ML-% IV SOLN
1000.0000 mg | INTRAVENOUS | Status: AC
  Administered 2023-06-06: 1000 mg via INTRAVENOUS

## 2023-06-06 MED ORDER — VANCOMYCIN HCL 1000 MG IV SOLR
INTRAVENOUS | Status: AC
  Filled 2023-06-06: qty 20

## 2023-06-06 MED ORDER — METOCLOPRAMIDE HCL 5 MG/ML IJ SOLN: 5.0000 mg | Freq: Three times a day (TID) | INTRAMUSCULAR | Status: DC | PRN

## 2023-06-06 MED ORDER — POVIDONE-IODINE 10 % EX SWAB
2.0000 | Freq: Once | CUTANEOUS | Status: AC
Start: 2023-06-06 — End: 2023-06-06
  Administered 2023-06-06: 2 via TOPICAL

## 2023-06-06 MED ORDER — MORPHINE SULFATE (PF) 2 MG/ML IV SOLN: 0.5000 mg | INTRAVENOUS | Status: DC | PRN

## 2023-06-06 MED ORDER — TRANEXAMIC ACID-NACL 1000-0.7 MG/100ML-% IV SOLN
1000.0000 mg | Freq: Once | INTRAVENOUS | Status: AC
  Administered 2023-06-06: 1000 mg via INTRAVENOUS

## 2023-06-06 MED ORDER — DIPHENHYDRAMINE HCL 12.5 MG/5ML PO ELIX: 25.0000 mg | ORAL_SOLUTION | ORAL | Status: DC | PRN

## 2023-06-06 MED ORDER — POLYETHYLENE GLYCOL 3350 17 G PO PACK: 17.0000 g | PACK | Freq: Every day | ORAL | Status: DC

## 2023-06-06 MED ORDER — SORBITOL 70 % SOLN: 30.0000 mL | Freq: Every day | Status: DC | PRN

## 2023-06-06 MED ORDER — HYDROCODONE-ACETAMINOPHEN 5-325 MG PO TABS
1.0000 | ORAL_TABLET | ORAL | Status: DC | PRN
  Administered 2023-06-06: 2 via ORAL

## 2023-06-06 MED ORDER — PHENOL 1.4 % MT LIQD: 1.0000 | OROMUCOSAL | Status: DC | PRN

## 2023-06-06 MED ORDER — DOXYCYCLINE HYCLATE 100 MG PO TABS: 100.0000 mg | ORAL_TABLET | Freq: Two times a day (BID) | ORAL | 0 refills | Status: DC

## 2023-06-06 MED ORDER — BUPIVACAINE-MELOXICAM ER 400-12 MG/14ML IJ SOLN
INTRAMUSCULAR | Status: AC
  Filled 2023-06-06: qty 1

## 2023-06-06 MED ORDER — CEFAZOLIN SODIUM-DEXTROSE 2-4 GM/100ML-% IV SOLN
2.0000 g | Freq: Four times a day (QID) | INTRAVENOUS | Status: DC
  Administered 2023-06-06: 2 g via INTRAVENOUS

## 2023-06-06 MED ORDER — PROPOFOL 10 MG/ML IV BOLUS
INTRAVENOUS | Status: DC | PRN
  Administered 2023-06-06: 60 ug/kg/min via INTRAVENOUS
  Administered 2023-06-06: 10 mg via INTRAVENOUS
  Administered 2023-06-06: 15 mg via INTRAVENOUS

## 2023-06-06 MED ORDER — DEXAMETHASONE SODIUM PHOSPHATE 10 MG/ML IJ SOLN
INTRAMUSCULAR | Status: AC
  Filled 2023-06-06: qty 1

## 2023-06-06 MED ORDER — LACTATED RINGERS IV BOLUS
250.0000 mL | Freq: Once | INTRAVENOUS | Status: AC
  Administered 2023-06-06: 250 mL via INTRAVENOUS

## 2023-06-06 MED ORDER — LIDOCAINE 2% (20 MG/ML) 5 ML SYRINGE
INTRAMUSCULAR | Status: DC | PRN
  Administered 2023-06-06: 40 mg via INTRAVENOUS

## 2023-06-06 MED ORDER — BUPIVACAINE-MELOXICAM ER 400-12 MG/14ML IJ SOLN
INTRAMUSCULAR | Status: AC
Start: 2023-06-06 — End: ?
  Filled 2023-06-06: qty 1

## 2023-06-06 MED ORDER — HYDROCODONE-ACETAMINOPHEN 5-325 MG PO TABS
ORAL_TABLET | ORAL | Status: AC
  Filled 2023-06-06: qty 2

## 2023-06-06 MED ORDER — LACTATED RINGERS IV SOLN: INTRAVENOUS | Status: DC

## 2023-06-06 MED ORDER — ONDANSETRON HCL 4 MG PO TABS: 4.0000 mg | ORAL_TABLET | Freq: Four times a day (QID) | ORAL | Status: DC | PRN

## 2023-06-06 MED ORDER — MIDAZOLAM HCL 2 MG/2ML IJ SOLN
INTRAMUSCULAR | Status: AC
Start: 2023-06-06 — End: ?
  Filled 2023-06-06: qty 2

## 2023-06-06 MED ORDER — SODIUM CHLORIDE 0.9 % IR SOLN
Status: DC | PRN
  Administered 2023-06-06: 3000 mL

## 2023-06-06 MED ORDER — BUPIVACAINE-MELOXICAM ER 400-12 MG/14ML IJ SOLN
INTRAMUSCULAR | Status: DC | PRN
Start: 2023-06-06 — End: 2023-06-06
  Administered 2023-06-06: 400 mg

## 2023-06-06 MED ORDER — ONDANSETRON HCL 4 MG/2ML IJ SOLN
INTRAMUSCULAR | Status: AC
Start: 2023-06-06 — End: ?
  Filled 2023-06-06: qty 2

## 2023-06-06 MED ORDER — HYDROCODONE-ACETAMINOPHEN 7.5-325 MG PO TABS: 1.0000 | ORAL_TABLET | ORAL | Status: DC | PRN

## 2023-06-06 MED ORDER — ASPIRIN 81 MG PO TBEC: 81.0000 mg | DELAYED_RELEASE_TABLET | Freq: Every day | ORAL | Status: DC

## 2023-06-06 MED ORDER — ACETAMINOPHEN 325 MG PO TABS: 325.0000 mg | ORAL_TABLET | Freq: Four times a day (QID) | ORAL | Status: DC | PRN

## 2023-06-06 SURGICAL SUPPLY — 53 items
BAG COUNTER SPONGE SURGICOUNT (BAG) ×1 IMPLANT
BAG DECANTER FOR FLEXI CONT (MISCELLANEOUS) ×1 IMPLANT
BLADE SAG 18X100X1.27 (BLADE) ×1 IMPLANT
COVER PERINEAL POST (MISCELLANEOUS) ×1 IMPLANT
COVER SURGICAL LIGHT HANDLE (MISCELLANEOUS) ×1 IMPLANT
CUP ACET PNNCL SECTR W/GRIP 56 (Hips) IMPLANT
DERMABOND ADVANCED .7 DNX12 (GAUZE/BANDAGES/DRESSINGS) IMPLANT
DRAPE C-ARM 42X72 X-RAY (DRAPES) ×1 IMPLANT
DRAPE POUCH INSTRU U-SHP 10X18 (DRAPES) ×1 IMPLANT
DRAPE STERI IOBAN 125X83 (DRAPES) ×1 IMPLANT
DRAPE U-SHAPE 47X51 STRL (DRAPES) ×2 IMPLANT
DRSG AQUACEL AG ADV 3.5X10 (GAUZE/BANDAGES/DRESSINGS) ×1 IMPLANT
DURAPREP 26ML APPLICATOR (WOUND CARE) ×2 IMPLANT
ELECTRODE BLDE 4.0 EZ CLN MEGD (MISCELLANEOUS) ×1 IMPLANT
ELECTRODE REM PT RTRN 9FT ADLT (ELECTROSURGICAL) ×1 IMPLANT
GLOVE BIOGEL PI IND STRL 7.0 (GLOVE) ×2 IMPLANT
GLOVE BIOGEL PI IND STRL 7.5 (GLOVE) ×5 IMPLANT
GLOVE ECLIPSE 7.0 STRL STRAW (GLOVE) ×2 IMPLANT
GLOVE SKINSENSE STRL SZ7.5 (GLOVE) ×1 IMPLANT
GLOVE SURG SYN 7.5 E (GLOVE) ×2 IMPLANT
GLOVE SURG SYN 7.5 PF PI (GLOVE) ×2 IMPLANT
GLOVE SURG UNDER POLY LF SZ7 (GLOVE) ×3 IMPLANT
GLOVE SURG UNDER POLY LF SZ7.5 (GLOVE) ×2 IMPLANT
GOWN STRL REUS W/ TWL LRG LVL3 (GOWN DISPOSABLE) IMPLANT
GOWN STRL REUS W/ TWL XL LVL3 (GOWN DISPOSABLE) ×1 IMPLANT
GOWN STRL SURGICAL XL XLNG (GOWN DISPOSABLE) ×1 IMPLANT
GOWN TOGA ZIPPER T7+ PEEL AWAY (MISCELLANEOUS) ×1 IMPLANT
HEAD FEM CER TAP +1.5 40 12/14 (Head) IMPLANT
HOOD PEEL AWAY T7 (MISCELLANEOUS) ×1 IMPLANT
IV NS IRRIG 3000ML ARTHROMATIC (IV SOLUTION) ×1 IMPLANT
KIT BASIN OR (CUSTOM PROCEDURE TRAY) ×1 IMPLANT
LINER NEUTRAL HIP ALTRX 40 56 (Liner) IMPLANT
MARKER SKIN DUAL TIP RULER LAB (MISCELLANEOUS) ×1 IMPLANT
NDL SPNL 18GX3.5 QUINCKE PK (NEEDLE) ×1 IMPLANT
NEEDLE SPNL 18GX3.5 QUINCKE PK (NEEDLE) ×1 IMPLANT
PACK TOTAL JOINT (CUSTOM PROCEDURE TRAY) ×1 IMPLANT
PACK UNIVERSAL I (CUSTOM PROCEDURE TRAY) ×1 IMPLANT
SCREW 6.5MMX25MM (Screw) IMPLANT
SET HNDPC FAN SPRY TIP SCT (DISPOSABLE) ×1 IMPLANT
SOLUTION PRONTOSAN WOUND 350ML (IRRIGATION / IRRIGATOR) ×1 IMPLANT
STEM FEMORAL SZ11 HIGH ACTIS (Stem) IMPLANT
SUT ETHIBOND 2 V 37 (SUTURE) ×1 IMPLANT
SUT ETHILON 2 0 FS 18 (SUTURE) IMPLANT
SUT STRATAFIX PDS+ 0 24IN (SUTURE) IMPLANT
SUT VIC AB 0 CT1 27XBRD ANBCTR (SUTURE) ×1 IMPLANT
SUT VIC AB 1 CTX36XBRD ANBCTR (SUTURE) ×1 IMPLANT
SUT VIC AB 2-0 CT1 TAPERPNT 27 (SUTURE) ×2 IMPLANT
SYR 50ML LL SCALE MARK (SYRINGE) ×1 IMPLANT
TOWEL GREEN STERILE (TOWEL DISPOSABLE) ×1 IMPLANT
TRAY CATH INTERMITTENT SS 16FR (CATHETERS) IMPLANT
TRAY FOLEY W/BAG SLVR 16FR ST (SET/KITS/TRAYS/PACK) IMPLANT
TUBE SUCT ARGYLE STRL (TUBING) ×1 IMPLANT
YANKAUER SUCT BULB TIP NO VENT (SUCTIONS) ×1 IMPLANT

## 2023-06-06 NOTE — Evaluation (Signed)
 Physical Therapy Evaluation Patient Details Name: Charles Navarro MRN: [id number] DOB: Oct 14, 1950 Today's Date: 06/06/2023  History of Present Illness  73 yo male s/p L DA-THA on 6/2. PMH includes mild aortic dilatation, CAD, depression, 1st degree heart block, OA, vertigo, OSA, lumbar laminectomy decompression microdiscectomy 2013 and 2024.  Clinical Impression  Pt presents with moderate L hip post-operative pain, impaired balance, post-operative LLE weakness, antalgic gait, and decreased activity tolerance. Pt to benefit from acute PT to address deficits. Pt ambulated good hallway distance with use of RW, overall pt requiring supervision to close guard for mobility. Pt does require light assist for LLE when exiting bed, but states he plans to sleep in a recliner or have his wife help him. PT educated pt on THA exercises, and pt performs all seated and supine exercises well with min cues. Handout administered. Pt with no further questions, plans to d/c home with support of wife today.        If plan is discharge home, recommend the following: A little help with walking and/or transfers;A little help with bathing/dressing/bathroom   Can travel by private vehicle        Equipment Recommendations Rolling walker (2 wheels);BSC/3in1  Recommendations for Other Services       Functional Status Assessment Patient has had a recent decline in their functional status and demonstrates the ability to make significant improvements in function in a reasonable and predictable amount of time.     Precautions / Restrictions Precautions Precautions: Fall Restrictions Weight Bearing Restrictions Per Provider Order: No LLE Weight Bearing Per Provider Order: Weight bearing as tolerated      Mobility  Bed Mobility Overal bed mobility: Needs Assistance Bed Mobility: Supine to Sit     Supine to sit: Min assist     General bed mobility comments: light assist for LLE lift and translation to EOB, bed exit  towards L. Pt plans to sleep in his recliner    Transfers Overall transfer level: Needs assistance Equipment used: Rolling walker (2 wheels) Transfers: Sit to/from Stand Sit to Stand: Supervision           General transfer comment: slow to rise, cues for correct hand placement when rising and sitting.    Ambulation/Gait Ambulation/Gait assistance: Contact guard assist, Supervision Gait Distance (Feet): 150 Feet Assistive device: Rolling walker (2 wheels) Gait Pattern/deviations: Step-through pattern, Decreased stride length, Trunk flexed, Antalgic Gait velocity: decr     General Gait Details: cues for sequencing, upright posture, placement in RW  Stairs            Wheelchair Mobility     Tilt Bed    Modified Rankin (Stroke Patients Only)       Balance Overall balance assessment: Needs assistance Sitting-balance support: No upper extremity supported, Feet supported Sitting balance-Leahy Scale: Good     Standing balance support: Bilateral upper extremity supported, During functional activity, Reliant on assistive device for balance Standing balance-Leahy Scale: Poor                               Pertinent Vitals/Pain Pain Assessment Pain Assessment: 0-10 Pain Score: 5  Pain Location: R hip Pain Descriptors / Indicators: Discomfort, Grimacing, Guarding, Sore Pain Intervention(s): Limited activity within patient's tolerance, Monitored during session, Repositioned    Home Living Family/patient expects to be discharged to:: Private residence Living Arrangements: Spouse/significant other Available Help at Discharge: Family;Available 24 hours/day Type of Home: Apartment Home Access:  Level entry       Home Layout: One level Home Equipment: Agricultural consultant (2 wheels);BSC/3in1;Cane - single point      Prior Function Prior Level of Function : Independent/Modified Independent             Mobility Comments: using cane PTA        Extremity/Trunk Assessment   Upper Extremity Assessment Upper Extremity Assessment: Defer to OT evaluation    Lower Extremity Assessment Lower Extremity Assessment: Overall WFL for tasks assessed;LLE deficits/detail LLE Deficits / Details: anticipated post-operative pain and weakness; able to perform ankle pumps, heel slide, quad set LLE: Unable to fully assess due to pain    Cervical / Trunk Assessment Cervical / Trunk Assessment: Back Surgery  Communication   Communication Communication: No apparent difficulties    Cognition Arousal: Alert Behavior During Therapy: WFL for tasks assessed/performed   PT - Cognitive impairments: No apparent impairments                                 Cueing Cueing Techniques: Verbal cues, Gestural cues     General Comments  Home walking program: up and walking 1x/hour during waking hours for short household distances with supervision of family, to promote circulation, activity tolerance, and strength maintenance.      Exercises Total Joint Exercises Ankle Circles/Pumps: AROM, Both, 5 reps, Seated Quad Sets: AROM, Left, 5 reps, Seated Short Arc Quad: AROM, Left, 5 reps, Seated Heel Slides: AROM, Left, 5 reps, Seated Hip ABduction/ADduction: AROM, Left, 5 reps, Seated   Assessment/Plan    PT Assessment All further PT needs can be met in the next venue of care  PT Problem List Decreased strength;Decreased mobility;Decreased activity tolerance;Decreased balance;Decreased knowledge of use of DME;Pain;Decreased knowledge of precautions       PT Treatment Interventions      PT Goals (Current goals can be found in the Care Plan section)  Acute Rehab PT Goals PT Goal Formulation: All assessment and education complete, DC therapy Time For Goal Achievement: 06/06/23 Potential to Achieve Goals: Good    Frequency       Co-evaluation               AM-PAC PT "6 Clicks" Mobility  Outcome Measure Help needed  turning from your back to your side while in a flat bed without using bedrails?: None Help needed moving from lying on your back to sitting on the side of a flat bed without using bedrails?: A Little Help needed moving to and from a bed to a chair (including a wheelchair)?: A Little Help needed standing up from a chair using your arms (e.g., wheelchair or bedside chair)?: A Little Help needed to walk in hospital room?: A Little Help needed climbing 3-5 steps with a railing? : A Little 6 Click Score: 19    End of Session Equipment Utilized During Treatment: Gait belt Activity Tolerance: Patient tolerated treatment well Patient left: in chair;with call bell/phone within reach;with nursing/sitter in room;Other (comment) (in PACU bay with RN at bedside) Nurse Communication: Mobility status PT Visit Diagnosis: Other abnormalities of gait and mobility (R26.89);Muscle weakness (generalized) (M62.81)    Time: 8295-6213 PT Time Calculation (min) (ACUTE ONLY): 29 min   Charges:   PT Evaluation $PT Eval Low Complexity: 1 Low PT Treatments $Gait Training: 8-22 mins PT General Charges $$ ACUTE PT VISIT: 1 Visit         Naol Ontiveros  Laneta Pintos, PT DPT Acute Rehabilitation Services Secure Chat Preferred  Office [phone]   Ethridge Herder 06/06/2023, 4:47 PM

## 2023-06-06 NOTE — Anesthesia Postprocedure Evaluation (Signed)
 Anesthesia Post Note  Patient: Charles Navarro  Procedure(s) Performed: ARTHROPLASTY, HIP, TOTAL, ANTERIOR APPROACH (Left: Hip)     Patient location during evaluation: PACU Anesthesia Type: MAC and Spinal Level of consciousness: awake and alert, oriented and patient cooperative Pain management: pain level controlled Vital Signs Assessment: post-procedure vital signs reviewed and stable Respiratory status: spontaneous breathing, nonlabored ventilation and respiratory function stable Cardiovascular status: blood pressure returned to baseline and stable Postop Assessment: no apparent nausea or vomiting, no headache, no backache and spinal receding Anesthetic complications: no   No notable events documented.  Last Vitals:  Vitals:   06/06/23 1130 06/06/23 1145  BP: 118/71 122/76  Pulse: (!) 52 (!) 50  Resp: (!) 6 13  Temp:    SpO2: 99% 99%    Last Pain:  Vitals:   06/06/23 1130  TempSrc:   PainSc: Asleep                 Jacquelyne Matte

## 2023-06-06 NOTE — Anesthesia Procedure Notes (Signed)
 Spinal  Patient location during procedure: OR Start time: 06/06/2023 8:53 AM End time: 06/06/2023 8:56 AM Reason for block: surgical anesthesia Staffing Performed: anesthesiologist  Anesthesiologist: Jacquelyne Matte, DO Performed by: Jacquelyne Matte, DO Authorized by: Jacquelyne Matte, DO   Preanesthetic Checklist Completed: patient identified, IV checked, risks and benefits discussed, surgical consent, monitors and equipment checked, pre-op evaluation and timeout performed Spinal Block Patient position: sitting Prep: DuraPrep and site prepped and draped Patient monitoring: cardiac monitor, continuous pulse ox and blood pressure Approach: midline Location: L3-4 Injection technique: single-shot Needle Needle type: Pencan  Needle gauge: 24 G Needle length: 9 cm Assessment Sensory level: T6 Events: CSF return Additional Notes Functioning IV was confirmed and monitors were applied. Sterile prep and drape, including hand hygiene and sterile gloves were used. The patient was positioned and the spine was prepped. The skin was anesthetized with lidocaine .  Free flow of clear CSF was obtained prior to injecting local anesthetic into the CSF.  The spinal needle aspirated freely following injection.  The needle was carefully withdrawn.  The patient tolerated the procedure well.

## 2023-06-06 NOTE — H&P (Signed)
 PREOPERATIVE H&P  Chief Complaint: left hip osteoarthritis  HPI: Charles Navarro is a 73 y.o. male who presents for surgical treatment of left hip osteoarthritis.  He denies any changes in medical history.  Past Surgical History:  Procedure Laterality Date   CATARACT EXTRACTION     COLONOSCOPY     LUMBAR LAMINECTOMY/DECOMPRESSION MICRODISCECTOMY  11/08/2011   Procedure: LUMBAR LAMINECTOMY/DECOMPRESSION MICRODISCECTOMY 1 LEVEL;  Surgeon: Ferris Hua, MD;  Location: [facility];  Service: Neurosurgery;  Laterality: Left;  left lumbar four-five decompression laminectomy microdiscectomy   LUMBAR LAMINECTOMY/DECOMPRESSION MICRODISCECTOMY Bilateral 04/21/2022   Procedure: LUMBAR TWO-THREE, LUMBAR THREE-FOUR BILATERAL LAMINECTOMY;  Surgeon: Gearl Keens, MD;  Location: [facility] OR;  Service: Neurosurgery;  Laterality: Bilateral;  3C   UMBILICAL HERNIA REPAIR  2017   WISDOM TOOTH EXTRACTION     Social History   Socioeconomic History   Marital status: Married    Spouse name: Not on file   Number of children: 0   Years of education: Not on file   Highest education level: Not on file  Occupational History   Not on file  Tobacco Use   Smoking status: Never   Smokeless tobacco: Never  Vaping Use   Vaping status: Never Used  Substance and Sexual Activity   Alcohol use: No   Drug use: No   Sexual activity: Not on file  Other Topics Concern   Not on file  Social History Narrative   0 biological children, 2 stepchildren   Social Drivers of Health   Financial Resource Strain: Low Risk  (01/28/2023)   Overall Financial Resource Strain (CARDIA)    Difficulty of Paying Living Expenses: Not hard at all  Food Insecurity: No Food Insecurity (01/28/2023)   Hunger Vital Sign    Worried About Running Out of Food in the Last Year: Never true    Ran Out of Food in the Last Year: Never true  Transportation Needs: No Transportation Needs (01/28/2023)   PRAPARE - Administrator, Civil Service  (Medical): No    Lack of Transportation (Non-Medical): No  Physical Activity: Inactive (01/28/2023)   Exercise Vital Sign    Days of Exercise per Week: 0 days    Minutes of Exercise per Session: 0 min  Stress: Stress Concern Present (01/28/2023)   [employer] of Occupational Health - Occupational Stress Questionnaire    Feeling of Stress : To some extent  Social Connections: Socially Integrated (01/28/2023)   Social Connection and Isolation Panel [NHANES]    Frequency of Communication with Friends and Family: More than three times a week    Frequency of Social Gatherings with Friends and Family: More than three times a week    Attends Religious Services: More than 4 times per year    Active Member of [employer] or Organizations: Yes    Attends Engineer, structural: More than 4 times per year    Marital Status: Married   Family History  Problem Relation Age of Onset   Alcoholism Father    Allergies  Allergen Reactions   Levaquin  [Levofloxacin ] Anaphylaxis    Shut down kidneys, numbness in toes    Codeine Nausea And Vomiting    dizziness   Gabapentin     Leg cramps   Oxycodone -Acetaminophen       Dizziness, GI Intolerance   Tramadol Nausea And Vomiting    "Doesn't work at all"    Bactrim [Sulfamethoxazole-Trimethoprim] Itching and Rash   Prior to Admission medications  Medication Sig Start Date End Date Taking? Authorizing Provider  acetaminophen  (TYLENOL ) 325 MG tablet Take 650 mg by mouth every 6 (six) hours as needed for moderate pain or headache. 10/26/21  Yes [provider]  aspirin  EC 81 MG tablet Take 1 tablet (81 mg total) by mouth daily. Swallow whole. 04/13/22  Yes Chandrasekhar, Caretha Chapel, MD  CAPSAICIN EX Apply 1 Application topically daily as needed (back pain).   Yes [provider]  CHIA SEED PO Take 15 mLs by mouth daily. Quart of water with lemon juice, raw honey,   Yes [provider]  cyclobenzaprine  (FLEXERIL ) 10 MG tablet  Take 10 mg by mouth daily as needed for muscle spasms.   Yes [provider]  cycloSPORINE (RESTASIS) 0.05 % ophthalmic emulsion Place 1 drop into both eyes 2 (two) times a week. 02/17/23  Yes [provider]  lidocaine  (LIDODERM ) 5 % Place 1 patch onto the skin daily as needed (back pain). 01/08/22  Yes [provider]  lisinopril -hydrochlorothiazide  (ZESTORETIC ) 10-12.5 MG tablet Take 1 tablet by mouth daily. 01/08/22  Yes [provider]  loratadine  (CLARITIN ) 10 MG tablet Take 10 mg by mouth daily as needed for allergies. 01/08/22  Yes [provider]  meclizine  (ANTIVERT ) 25 MG tablet Take 25 mg by mouth 3 (three) times daily as needed for dizziness. 1 - 3 tablets as needed for dizziness   Yes [provider]  Menthol -Camphor (TIGER BALM ARTHRITIS RUB EX) Apply 1 Application topically daily as needed (pain).   Yes [provider]  Multiple Vitamin (MULTIVITAMIN WITH MINERALS) TABS Take 1 tablet by mouth daily.   Yes [provider]  Omega-3 Fatty Acids (FISH OIL) 1000 MG CAPS Take 1,000 mg by mouth daily.   Yes [provider]  pantoprazole  (PROTONIX ) 40 MG tablet Take 40 mg by mouth daily.   Yes [provider]  sodium chloride  (OCEAN) 0.65 % nasal spray Place into the nose as needed for congestion. 06/03/22  Yes [provider]  zolpidem (AMBIEN) 10 MG tablet Take 5 mg by mouth at bedtime as needed for sleep. 05/23/23  Yes [provider]     Positive ROS: All other systems have been reviewed and were otherwise negative with the exception of those mentioned in the HPI and as above.  Physical Exam: General: Alert, no acute distress Cardiovascular: No pedal edema Respiratory: No cyanosis, no use of accessory musculature GI: abdomen soft Skin: No lesions in the area of chief complaint Neurologic: Sensation intact distally Psychiatric: Patient is competent for consent with normal mood and  affect Lymphatic: no lymphedema  MUSCULOSKELETAL: exam stable  Assessment: left hip osteoarthritis  Plan: Plan for Procedure(s): ARTHROPLASTY, HIP, TOTAL, ANTERIOR APPROACH  The risks benefits and alternatives were discussed with the patient including but not limited to the risks of nonoperative treatment, versus surgical intervention including infection, bleeding, nerve injury,  blood clots, cardiopulmonary complications, morbidity, mortality, among others, and they were willing to proceed.   Claria Crofts, MD 06/06/2023 5:59 AM

## 2023-06-06 NOTE — Transfer of Care (Signed)
 Immediate Anesthesia Transfer of Care Note  Patient: Charles Navarro  Procedure(s) Performed: ARTHROPLASTY, HIP, TOTAL, ANTERIOR APPROACH (Left: Hip)  Patient Location: PACU  Anesthesia Type:MAC and Spinal  Level of Consciousness: drowsy  Airway & Oxygen  Therapy: Patient Spontanous Breathing and Patient connected to face mask  Post-op Assessment: Report given to RN  Post vital signs: Reviewed and stable  Last Vitals:  Vitals Value Taken Time  BP 112/67 06/06/23 1104  Temp 36.4 C 06/06/23 1104  Pulse 54 06/06/23 1105  Resp 17 06/06/23 1105  SpO2 100 % 06/06/23 1105  Vitals shown include unfiled device data.  Last Pain:  Vitals:   06/06/23 0712  TempSrc:   PainSc: 4          Complications: No notable events documented.

## 2023-06-06 NOTE — Op Note (Signed)
 ARTHROPLASTY, HIP, TOTAL, ANTERIOR APPROACH  Procedure Note Tonnie Friedel   [id number]  Pre-op Diagnosis: left hip osteoarthritis     Post-op Diagnosis: same  Operative Findings Severe DJD of th left hip   Operative Procedures  1. Total hip replacement; Left hip; uncemented [id number]   Surgeon: Dyana Glade, M.D.  Assist: Sharran Decent, PA-C   Anesthesia: spinal  Prosthesis: Depuy Acetabulum: Pinnacle 56 mm Femur: Actis 11 HO Head: 40 mm size: +1.5 Liner: +4 Bearing Type: ceramic/poly  Total Hip Arthroplasty (Anterior Approach) Op Note:  After informed consent was obtained and the operative extremity marked in the holding area, the patient was brought back to the operating room and placed supine on the HANA table. Next, the operative extremity was prepped and draped in normal sterile fashion. Surgical timeout occurred verifying patient identification, surgical site, surgical procedure and administration of antibiotics.  A 10 cm longitudinal incision was made starting from 2 fingerbreadths lateral and inferior to the ASIS towards the lateral aspect of the patella.  A Hueter approach to the hip was performed, using the interval between tensor fascia lata and sartorius.  Dissection was carried bluntly down onto the anterior hip capsule. The lateral femoral circumflex vessels were identified and coagulated. A capsulotomy was performed and the capsular flaps tagged for later repair.  The neck osteotomy was performed. The femoral head was removed which showed severe wear, the acetabular rim was cleared of soft tissue and osteophytes and attention was turned to reaming the acetabulum.  Sequential reaming was performed under fluoroscopic guidance down to the floor of the cotyloid fossa. We reamed to a size 55 mm, and then impacted the acetabular shell in 40 degrees of inclination and 15 degrees of anterversion. A 25 mm cancellous screw was placed to secure the shell.  The liner was then  placed after irrigation and attention turned to the femur.  After placing the femoral hook, the leg was taken to externally rotated, extended and adducted position taking care to perform soft tissue releases to allow for adequate mobilization of the femur. Soft tissue was cleared from the shoulder of the greater trochanter and the hook elevator used to improve exposure of the proximal femur. Sequential broaching performed up to a size 11.  High offset trial neck and +5 head were placed. The leg was brought back up to neutral and the construct reduced.  The position and sizing of components, offset and leg lengths were checked using fluoroscopy. I felt the +5 head was too tight and over lengthened the leg.  Then we trialed with a +1.5 head.  Stability of the construct was checked in 45 degrees of hip extension and 90 degrees of external rotation without any subluxation, shuck or impingement of prosthesis. We dislocated the prosthesis, dropped the leg back into position, removed trial components, and irrigated copiously. The final stem and head was then placed, the leg brought back up, the system reduced and fluoroscopy used to verify positioning.  Antibiotic irrigation was placed in the surgical wound.   We irrigated, obtained hemostasis and closed the capsule using #2 ethibond suture.  A topical mixture of 0.25% bupivacaine  and meloxicam  was placed deep to the fascia.  One gram of vancomycin  powder was placed in the surgical bed.   One gram of topical tranexamic acid was injected into the joint.  The fascia was closed with #1 stratafix, the deep fat layer was closed with 0 vicryl, the subcutaneous layers closed with 2.0 Vicryl Plus and the  skin closed with 2.0 nylon and dermabond. A sterile dressing was applied. The patient was awakened in the operating room and taken to recovery in stable condition.  All sponge, needle, and instrument counts were correct at the end of the case.   Trellis Fries, my PA, was a  medical necessity for opening, closing, limb positioning, retracting, exposing, and overall facilitation and timely completion of the surgery.  Position: supine  Complications: see description of procedure.  Time Out: performed   Drains/Packing: none  Estimated blood loss: see anesthesia record  Returned to Recovery Room: in good condition.   Antibiotics: yes   Mechanical VTE (DVT) Prophylaxis: sequential compression devices, TED thigh-high  Chemical VTE (DVT) Prophylaxis: aspirin    Fluid Replacement: see anesthesia record  Specimens Removed: 1 to pathology   Sponge and Instrument Count Correct? yes   PACU: portable radiograph - low AP   Plan/RTC: Return in 2 weeks for staple removal. Weight Bearing/Load Lower Extremity: full  Hip precautions: none Suture Removal: 2 weeks   N. Claria Crofts, MD [facility] 10:28 AM   Implant Name Type Inv. Item Serial No. Manufacturer Lot No. LRB No. Used Action  CUP ACET PNNCL SECTR W/GRIP 56 - [id number] Hips CUP ACET PNNCL SECTR W/GRIP 56  [employer] [id number] Left 1 Implanted  LINER NEUTRAL HIP ALTRX 40 56 - [id number] Liner LINER NEUTRAL HIP ALTRX 40 56  DEPUY ORTH[employer] [id number] Left 1 Implanted  SCREW 6.5MMX25MM - [id number] Screw SCREW 6.5MMX25MM  DEPUY [employer]TH[employer] [id number] Left 1 Implanted  STEM FEMORAL SZ11 HIGH ACTIS - [id number] Stem STEM FEMORAL SZ11 HIGH ACTIS  [employer]PUY ORTH[employer] [id number] Left 1 Implanted  HEAD FEM CER TAP +1.5 40 12/14 - [id number] Head HEAD FEM CER TAP +1.5 40 12/14  [employer]PU[employer]TH[employer] [id number] Left 1 Implanted

## 2023-06-06 NOTE — Discharge Instructions (Signed)

## 2023-06-06 NOTE — Discharge Planning (Signed)
 Charles Navarro, BSN, RN, [place] [phone] Pt qualifies for DME [facility]) rolling walker and bedside commode.  DME  ordered through [employer].  Courtenay Distel of [employer] notified to deliver DME to pt bedside prior to D/C home.  Lacy Taglieri J. Rachel Budds, BSN, RN, NCM  Transitions of Care  Nurse Case Manager  [facility] Emergency Departments  Operative Services  [phone]

## 2023-06-07 ENCOUNTER — Other Ambulatory Visit: Payer: Self-pay | Admitting: Physician Assistant

## 2023-06-07 ENCOUNTER — Encounter (HOSPITAL_COMMUNITY): Payer: Self-pay | Admitting: Orthopaedic Surgery

## 2023-06-07 ENCOUNTER — Telehealth: Payer: Self-pay

## 2023-06-07 MED ORDER — DOXYCYCLINE HYCLATE 100 MG PO TABS: 100.0000 mg | ORAL_TABLET | Freq: Two times a day (BID) | ORAL | 0 refills | Status: DC

## 2023-06-07 NOTE — Telephone Encounter (Signed)
 Patient would like clarification on Rx for Doxycycline .  Stated that his Rx is for 10 days, but was told something different. Patient had left hip surgery on 06/06/2023.  Cb# [phone].  Please advise.  Thank you

## 2023-06-07 NOTE — Telephone Encounter (Signed)
 Just an [place]

## 2023-06-07 NOTE — Telephone Encounter (Signed)
 Originally sent in for ten days and then we decided to do 4 weeks so changed rx to 4 weeks

## 2023-06-07 NOTE — Telephone Encounter (Signed)
 Copied from CRM [id number]. Topic: General - Other >> Jun 07, 2023 10:33 AM Alyse July wrote: Reason for CRM: Patient would like to make provider aware that he had a total hip replacement yesterday and procedure went well. Patient also stated that all records from procedure should be available for review.

## 2023-06-08 ENCOUNTER — Telehealth: Payer: Self-pay | Admitting: Orthopaedic Surgery

## 2023-06-08 DIAGNOSIS — Z96642 Presence of left artificial hip joint: Secondary | ICD-10-CM | POA: Diagnosis not present

## 2023-06-08 DIAGNOSIS — I440 Atrioventricular block, first degree: Secondary | ICD-10-CM | POA: Diagnosis not present

## 2023-06-08 DIAGNOSIS — Z471 Aftercare following joint replacement surgery: Secondary | ICD-10-CM | POA: Diagnosis not present

## 2023-06-08 DIAGNOSIS — F32A Depression, unspecified: Secondary | ICD-10-CM | POA: Diagnosis not present

## 2023-06-08 DIAGNOSIS — Z7982 Long term (current) use of aspirin: Secondary | ICD-10-CM | POA: Diagnosis not present

## 2023-06-08 DIAGNOSIS — G4733 Obstructive sleep apnea (adult) (pediatric): Secondary | ICD-10-CM | POA: Diagnosis not present

## 2023-06-08 DIAGNOSIS — I2510 Atherosclerotic heart disease of native coronary artery without angina pectoris: Secondary | ICD-10-CM | POA: Diagnosis not present

## 2023-06-08 DIAGNOSIS — Z556 Problems related to health literacy: Secondary | ICD-10-CM | POA: Diagnosis not present

## 2023-06-08 DIAGNOSIS — I77819 Aortic ectasia, unspecified site: Secondary | ICD-10-CM | POA: Diagnosis not present

## 2023-06-08 NOTE — Telephone Encounter (Signed)
 Patient called and said he is doing good and thank you for all you do. CB#[phone]

## 2023-06-10 DIAGNOSIS — I440 Atrioventricular block, first degree: Secondary | ICD-10-CM | POA: Diagnosis not present

## 2023-06-10 DIAGNOSIS — I2510 Atherosclerotic heart disease of native coronary artery without angina pectoris: Secondary | ICD-10-CM | POA: Diagnosis not present

## 2023-06-10 DIAGNOSIS — G4733 Obstructive sleep apnea (adult) (pediatric): Secondary | ICD-10-CM | POA: Diagnosis not present

## 2023-06-10 DIAGNOSIS — Z471 Aftercare following joint replacement surgery: Secondary | ICD-10-CM | POA: Diagnosis not present

## 2023-06-10 DIAGNOSIS — F32A Depression, unspecified: Secondary | ICD-10-CM | POA: Diagnosis not present

## 2023-06-10 DIAGNOSIS — I77819 Aortic ectasia, unspecified site: Secondary | ICD-10-CM | POA: Diagnosis not present

## 2023-06-13 ENCOUNTER — Telehealth: Payer: Self-pay | Admitting: *Deleted

## 2023-06-13 ENCOUNTER — Other Ambulatory Visit: Payer: Self-pay | Admitting: Physician Assistant

## 2023-06-13 DIAGNOSIS — I440 Atrioventricular block, first degree: Secondary | ICD-10-CM | POA: Diagnosis not present

## 2023-06-13 DIAGNOSIS — G4733 Obstructive sleep apnea (adult) (pediatric): Secondary | ICD-10-CM | POA: Diagnosis not present

## 2023-06-13 DIAGNOSIS — Z471 Aftercare following joint replacement surgery: Secondary | ICD-10-CM | POA: Diagnosis not present

## 2023-06-13 DIAGNOSIS — I2510 Atherosclerotic heart disease of native coronary artery without angina pectoris: Secondary | ICD-10-CM | POA: Diagnosis not present

## 2023-06-13 DIAGNOSIS — I77819 Aortic ectasia, unspecified site: Secondary | ICD-10-CM | POA: Diagnosis not present

## 2023-06-13 DIAGNOSIS — F32A Depression, unspecified: Secondary | ICD-10-CM | POA: Diagnosis not present

## 2023-06-14 ENCOUNTER — Other Ambulatory Visit: Payer: Self-pay | Admitting: Physician Assistant

## 2023-06-15 DIAGNOSIS — I77819 Aortic ectasia, unspecified site: Secondary | ICD-10-CM | POA: Diagnosis not present

## 2023-06-15 DIAGNOSIS — Z471 Aftercare following joint replacement surgery: Secondary | ICD-10-CM | POA: Diagnosis not present

## 2023-06-15 DIAGNOSIS — I2510 Atherosclerotic heart disease of native coronary artery without angina pectoris: Secondary | ICD-10-CM | POA: Diagnosis not present

## 2023-06-15 DIAGNOSIS — G4733 Obstructive sleep apnea (adult) (pediatric): Secondary | ICD-10-CM | POA: Diagnosis not present

## 2023-06-15 DIAGNOSIS — I440 Atrioventricular block, first degree: Secondary | ICD-10-CM | POA: Diagnosis not present

## 2023-06-15 DIAGNOSIS — F32A Depression, unspecified: Secondary | ICD-10-CM | POA: Diagnosis not present

## 2023-06-17 ENCOUNTER — Telehealth: Payer: Self-pay

## 2023-06-17 DIAGNOSIS — G4733 Obstructive sleep apnea (adult) (pediatric): Secondary | ICD-10-CM | POA: Diagnosis not present

## 2023-06-17 DIAGNOSIS — I2510 Atherosclerotic heart disease of native coronary artery without angina pectoris: Secondary | ICD-10-CM | POA: Diagnosis not present

## 2023-06-17 DIAGNOSIS — F32A Depression, unspecified: Secondary | ICD-10-CM | POA: Diagnosis not present

## 2023-06-17 DIAGNOSIS — Z471 Aftercare following joint replacement surgery: Secondary | ICD-10-CM | POA: Diagnosis not present

## 2023-06-17 DIAGNOSIS — I77819 Aortic ectasia, unspecified site: Secondary | ICD-10-CM | POA: Diagnosis not present

## 2023-06-17 DIAGNOSIS — I440 Atrioventricular block, first degree: Secondary | ICD-10-CM | POA: Diagnosis not present

## 2023-06-17 NOTE — Telephone Encounter (Signed)
 [facility] advising patient is complaining of diarrhea and patients wife had been DX with C-Diff. Patient is still on Antibiotics, nurse asking does patient need to monitor symptoms and keep appointment for Tuesday or should he be seen in ED? Please call [phone] this is patients number.

## 2023-06-17 NOTE — Telephone Encounter (Signed)
 Needs to call pcp for management for possible c-diff, but I would def recommend staying hydrated.   Let us  know Monday how he is before coming in on Tuesday if he may have c-diff

## 2023-06-21 ENCOUNTER — Encounter: Payer: Self-pay | Admitting: Physician Assistant

## 2023-06-21 ENCOUNTER — Ambulatory Visit (INDEPENDENT_AMBULATORY_CARE_PROVIDER_SITE_OTHER): Admitting: Physician Assistant

## 2023-06-21 DIAGNOSIS — Z96642 Presence of left artificial hip joint: Secondary | ICD-10-CM

## 2023-06-21 NOTE — Progress Notes (Signed)
 Post-Op Visit Note   Patient: Charles Navarro           Date of Birth: [birth year]           MRN: [id number] Visit Date: 06/21/2023 PCP: Adra Alanis, FNP   Assessment & Plan:  Chief Complaint:  Chief Complaint  Patient presents with   Left Hip - Follow-up    S/p left total hip arthroplasty 06/06/2023   Visit Diagnoses:  1. History of total hip replacement, left     Plan: Patient is a pleasant 73 year old gentleman who comes in today 2 weeks status post left total hip replacement 06/06/2023.  He has been doing well.  He is experiencing normal postsurgical pain but nothing requiring any narcotic pain medication or muscle relaxers.  He has been compliant taking a baby aspirin  twice daily for DVT prophylaxis.  He has been compliant taking his antibiotics.  He is currently ambulating with a single-point cane.  Examination of the left hip reveals a well-healed surgical incision with nylon sutures in place.  He does have a moderate size seroma.  No evidence of infection or cellulitis.  Calves are soft nontender.  He is neurovascularly intact distally.  Today, sutures were removed and Steri-Strips applied.  Continue with his baby aspirin  twice daily for DVT prophylaxis.  Postoperative instructions provided.  Follow-up in 4 weeks for repeat evaluation and AP pelvis x-rays.  Call with concerns or questions.  Follow-Up Instructions: Return in about 4 weeks (around 07/19/2023).   Orders:  No orders of the defined types were placed in this encounter.  No orders of the defined types were placed in this encounter.   Imaging: No new imaging  PMFS History: Patient Active Problem List   Diagnosis Date Noted   Primary osteoarthritis of left hip 05/19/2023   Apical variant hypertrophic cardiomyopathy (HCC) 01/28/2023   Obesity, morbid (HCC) 01/28/2023   Coronary artery disease involving native coronary artery of native heart without angina pectoris 06/24/2022   Spinal stenosis of lumbar  region 04/21/2022   Pneumonia due to COVID-19 virus 02/08/2019   OSA (obstructive sleep apnea) 02/08/2019   Boil 03/06/2012   Discitis of lumbar region 01/31/2012   Diarrhea 01/31/2012   Nausea 01/31/2012   Acid reflux 01/31/2012   Weight loss, abnormal 01/31/2012   Past Medical History:  Diagnosis Date   Aortic dilatation (HCC)    mild   Bifascicular block    Bronchitis    hx of   Coronary artery disease    Depression    First degree heart block    GERD (gastroesophageal reflux disease)    Hypertension    takes meds daily   Osteoarthritis of left hip    Sleep apnea    pt does not wear CPAP   Vertigo     Family History  Problem Relation Age of Onset   Alcoholism Father     Past Surgical History:  Procedure Laterality Date   CATARACT EXTRACTION     COLONOSCOPY     LUMBAR LAMINECTOMY/DECOMPRESSION MICRODISCECTOMY  11/08/2011   Procedure: LUMBAR LAMINECTOMY/DECOMPRESSION MICRODISCECTOMY 1 LEVEL;  Surgeon: Ferris Hua, MD;  Location: [facility];  Service: Neurosurgery;  Laterality: Left;  left lumbar four-five decompression laminectomy microdiscectomy   LUMBAR LAMINECTOMY/DECOMPRESSION MICRODISCECTOMY Bilateral 04/21/2022   Procedure: LUMBAR TWO-THREE, LUMBAR THREE-FOUR BILATERAL LAMINECTOMY;  Surgeon: Gearl Keens, MD;  Location: [facility];  Service: Neurosurgery;  Laterality: Bilateral;  3C   TOTAL HIP ARTHROPLASTY Left 06/06/2023   Procedure: ARTHROPLASTY, HIP,  TOTAL, ANTERIOR APPROACH;  Surgeon: Wes Hamman, MD;  Location: [facility] OR;  Service: Orthopedics;  Laterality: Left;   UMBILICAL HERNIA REPAIR  2017   WISDOM TOOTH EXTRACTION     Social History   Occupational History   Not on file  Tobacco Use   Smoking status: Never   Smokeless tobacco: Never  Vaping Use   Vaping status: Never Used  Substance and Sexual Activity   Alcohol use: No   Drug use: No   Sexual activity: Not on file

## 2023-06-22 DIAGNOSIS — Z471 Aftercare following joint replacement surgery: Secondary | ICD-10-CM | POA: Diagnosis not present

## 2023-06-22 DIAGNOSIS — I2510 Atherosclerotic heart disease of native coronary artery without angina pectoris: Secondary | ICD-10-CM | POA: Diagnosis not present

## 2023-06-22 DIAGNOSIS — I77819 Aortic ectasia, unspecified site: Secondary | ICD-10-CM | POA: Diagnosis not present

## 2023-06-22 DIAGNOSIS — G4733 Obstructive sleep apnea (adult) (pediatric): Secondary | ICD-10-CM | POA: Diagnosis not present

## 2023-06-22 DIAGNOSIS — F32A Depression, unspecified: Secondary | ICD-10-CM | POA: Diagnosis not present

## 2023-06-22 DIAGNOSIS — I440 Atrioventricular block, first degree: Secondary | ICD-10-CM | POA: Diagnosis not present

## 2023-06-27 ENCOUNTER — Telehealth: Payer: Self-pay | Admitting: Orthopaedic Surgery

## 2023-06-27 NOTE — Telephone Encounter (Signed)
 Patient came by office requesting a change in the handicap form that he was given by Dr Jerri. Pt would like to have a permanent disability parking placard #3 on form (per pt) instead. Pt left the previous form and a new form if it could be completed for a permanent one. Will place forms on [employer]

## 2023-06-28 NOTE — Telephone Encounter (Signed)
 No we don't give permanent ones.  6 months for a hip replacement is typically what I give.

## 2023-06-28 NOTE — Telephone Encounter (Signed)
 Patient aware. Will leave this at the front desk to pick up

## 2023-06-29 ENCOUNTER — Ambulatory Visit: Payer: Medicare Other | Admitting: Internal Medicine

## 2023-07-01 ENCOUNTER — Encounter: Payer: Self-pay | Admitting: Family

## 2023-07-01 ENCOUNTER — Ambulatory Visit (INDEPENDENT_AMBULATORY_CARE_PROVIDER_SITE_OTHER): Admitting: Family

## 2023-07-01 ENCOUNTER — Ambulatory Visit (INDEPENDENT_AMBULATORY_CARE_PROVIDER_SITE_OTHER): Admitting: Physician Assistant

## 2023-07-01 VITALS — BP 125/70 | HR 67 | Ht 78.0 in | Wt 286.4 lb

## 2023-07-01 DIAGNOSIS — L989 Disorder of the skin and subcutaneous tissue, unspecified: Secondary | ICD-10-CM | POA: Diagnosis not present

## 2023-07-01 DIAGNOSIS — S91109A Unspecified open wound of unspecified toe(s) without damage to nail, initial encounter: Secondary | ICD-10-CM | POA: Diagnosis not present

## 2023-07-01 DIAGNOSIS — Z96642 Presence of left artificial hip joint: Secondary | ICD-10-CM

## 2023-07-01 NOTE — Progress Notes (Signed)
 Post-Op Visit Note   Patient: Charles Navarro           Date of Birth: [birth year]           MRN: [id number] Visit Date: 07/01/2023 PCP: Jason Leita Repine, FNP   Assessment & Plan:  Chief Complaint:  Chief Complaint  Patient presents with   Left Hip - Routine Post Op   Visit Diagnoses:  1. History of total hip replacement, left     Plan: Patient is a pleasant 73 year old gentleman who comes in today with concerns about his left hip.  He is approximately 4 weeks status post left total hip replacement 06-06-23.  He has been doing fairly well but has not noticed improvement in his left hip swelling.  He has been using ice.  He denies any increased pain, fevers or chills or other constitutional symptoms.  Examination of his left hip reveals a well-healed surgical incision.  No evidence of infection or cellulitis.  He does have a small seroma.  This is nontender.  He is neurovascularly intact distally.  He has been on doxycycline  for which she is scheduled to finish next Tuesday.  At this point, I do not feel his seroma is big enough to warrant aspiration.  I recommended heat and ice.  He will follow-up at his regularly scheduled appointment in 2 weeks.  Call with any concerns or questions.  Follow-Up Instructions: Return for 2 weeks for regularly scheduled appt.   Orders:  No orders of the defined types were placed in this encounter.  No orders of the defined types were placed in this encounter.   Imaging: No new imaging  PMFS History: Patient Active Problem List   Diagnosis Date Noted   Primary osteoarthritis of left hip 05/19/2023   Apical variant hypertrophic cardiomyopathy (HCC) 01/28/2023   Obesity, morbid (HCC) 01/28/2023   Coronary artery disease involving native coronary artery of native heart without angina pectoris 06/24/2022   Spinal stenosis of lumbar region 04/21/2022   Pneumonia due to COVID-19 virus 02/08/2019   OSA (obstructive sleep apnea) 02/08/2019   Boil  03/06/2012   Discitis of lumbar region 01/31/2012   Diarrhea 01/31/2012   Nausea 01/31/2012   Acid reflux 01/31/2012   Weight loss, abnormal 01/31/2012   Past Medical History:  Diagnosis Date   Aortic dilatation (HCC)    mild   Bifascicular block    Bronchitis    hx of   Coronary artery disease    Depression    First degree heart block    GERD (gastroesophageal reflux disease)    Hypertension    takes meds daily   Osteoarthritis of left hip    Sleep apnea    pt does not wear CPAP   Vertigo     Family History  Problem Relation Age of Onset   Alcoholism Father     Past Surgical History:  Procedure Laterality Date   CATARACT EXTRACTION     COLONOSCOPY     LUMBAR LAMINECTOMY/DECOMPRESSION MICRODISCECTOMY  11/08/2011   Procedure: LUMBAR LAMINECTOMY/DECOMPRESSION MICRODISCECTOMY 1 LEVEL;  Surgeon: Arley SHAUNNA Helling, MD;  Location: [facility];  Service: Neurosurgery;  Laterality: Left;  left lumbar four-five decompression laminectomy microdiscectomy   LUMBAR LAMINECTOMY/DECOMPRESSION MICRODISCECTOMY Bilateral 04/21/2022   Procedure: LUMBAR TWO-THREE, LUMBAR THREE-FOUR BILATERAL LAMINECTOMY;  Surgeon: Helling Arley, MD;  Location: [facility] OR;  Service: Neurosurgery;  Laterality: Bilateral;  3C   TOTAL HIP ARTHROPLASTY Left 06/06/2023   Procedure: ARTHROPLASTY, HIP, TOTAL, ANTERIOR APPROACH;  Surgeon: Jerri,  Kay HERO, MD;  Location: [facility] OR;  Service: Orthopedics;  Laterality: Left;   UMBILICAL HERNIA REPAIR  2017   WISDOM TOOTH EXTRACTION     Social History   Occupational History   Not on file  Tobacco Use   Smoking status: Never   Smokeless tobacco: Never  Vaping Use   Vaping status: Never Used  Substance and Sexual Activity   Alcohol use: No   Drug use: No   Sexual activity: Not on file

## 2023-07-01 NOTE — Progress Notes (Signed)
 Charles Navarro is a 73 y.o. male with the following history as recorded in EpicCare:  Patient Active Problem List   Diagnosis Date Noted   Primary osteoarthritis of left hip 05/19/2023   Apical variant hypertrophic cardiomyopathy (HCC) 01/28/2023   Obesity, morbid (HCC) 01/28/2023   Coronary artery disease involving native coronary artery of native heart without angina pectoris 06/24/2022   Spinal stenosis of lumbar region 04/21/2022   Pneumonia due to COVID-19 virus 02/08/2019   OSA (obstructive sleep apnea) 02/08/2019   Boil 03/06/2012   Discitis of lumbar region 01/31/2012   Diarrhea 01/31/2012   Nausea 01/31/2012   Acid reflux 01/31/2012   Weight loss, abnormal 01/31/2012    Current Outpatient Medications  Medication Sig Dispense Refill   CAPSAICIN EX Apply 1 Application topically daily as needed (back pain).     CHIA SEED PO Take 15 mLs by mouth daily. Quart of water with lemon juice, raw honey,     [facility] ASPIRIN  ADULT LOW DOSE 81 MG chewable tablet Chew 1 tablet (81 mg total) by mouth 2 (two) times daily. To be taken after surgery to prevent blood clots 84 tablet 0   cycloSPORINE (RESTASIS) 0.05 % ophthalmic emulsion Place 1 drop into both eyes 2 (two) times a week.     docusate sodium  (COLACE) 100 MG capsule Take 1 capsule (100 mg total) by mouth daily as needed. 30 capsule 2   doxycycline  (VIBRA -TABS) 100 MG tablet Take 1 tablet (100 mg total) by mouth 2 (two) times daily. 36 tablet 0   HYDROcodone -acetaminophen  (NORCO/VICODIN) 5-325 MG tablet Take 1-2 tablets by mouth every 6 (six) hours as needed for moderate pain (pain score 4-6). 40 tablet 0   lidocaine  (LIDODERM ) 5 % Place 1 patch onto the skin daily as needed (back pain).     lisinopril -hydrochlorothiazide  (ZESTORETIC ) 10-12.5 MG tablet Take 1 tablet by mouth daily.     loratadine  (CLARITIN ) 10 MG tablet Take 10 mg by mouth daily as needed for allergies.     meclizine  (ANTIVERT ) 25 MG tablet Take 25 mg by mouth 3 (three) times  daily as needed for dizziness. 1 - 3 tablets as needed for dizziness     Menthol -Camphor (TIGER BALM ARTHRITIS RUB EX) Apply 1 Application topically daily as needed (pain).     methocarbamol  (ROBAXIN ) 750 MG tablet Take 1 tablet (750 mg total) by mouth 3 (three) times daily as needed. 30 tablet 2   Multiple Vitamin (MULTIVITAMIN WITH MINERALS) TABS Take 1 tablet by mouth daily.     ondansetron  (ZOFRAN ) 4 MG tablet TAKE 1 TABLET BY MOUTH EVERY 8 HOURS AS NEEDED FOR NAUSEA AND VOMITING 40 tablet 0   pantoprazole  (PROTONIX ) 40 MG tablet Take 40 mg by mouth daily.     sodium chloride  (OCEAN) 0.65 % nasal spray Place into the nose as needed for congestion.     zolpidem (AMBIEN) 10 MG tablet Take 5 mg by mouth at bedtime as needed for sleep.     No current facility-administered medications for this visit.    Allergies: Levaquin  [levofloxacin ], Codeine, Gabapentin, Oxycodone -acetaminophen , Tramadol, and Bactrim [sulfamethoxazole-trimethoprim]  Past Medical History:  Diagnosis Date   Aortic dilatation (HCC)    mild   Bifascicular block    Bronchitis    hx of   Coronary artery disease    Depression    First degree heart block    GERD (gastroesophageal reflux disease)    Hypertension    takes meds daily   Osteoarthritis of left hip  Sleep apnea    pt does not wear CPAP   Vertigo     Past Surgical History:  Procedure Laterality Date   CATARACT EXTRACTION     COLONOSCOPY     LUMBAR LAMINECTOMY/DECOMPRESSION MICRODISCECTOMY  11/08/2011   Procedure: LUMBAR LAMINECTOMY/DECOMPRESSION MICRODISCECTOMY 1 LEVEL;  Surgeon: Arley SHAUNNA Helling, MD;  Location: [facility];  Service: Neurosurgery;  Laterality: Left;  left lumbar four-five decompression laminectomy microdiscectomy   LUMBAR LAMINECTOMY/DECOMPRESSION MICRODISCECTOMY Bilateral 04/21/2022   Procedure: LUMBAR TWO-THREE, LUMBAR THREE-FOUR BILATERAL LAMINECTOMY;  Surgeon: Helling Arley, MD;  Location: [facility] OR;  Service: Neurosurgery;  Laterality:  Bilateral;  3C   TOTAL HIP ARTHROPLASTY Left 06/06/2023   Procedure: ARTHROPLASTY, HIP, TOTAL, ANTERIOR APPROACH;  Surgeon: Jerri Kay HERO, MD;  Location: [facility];  Service: Orthopedics;  Laterality: Left;   UMBILICAL HERNIA REPAIR  2017   WISDOM TOOTH EXTRACTION      Family History  Problem Relation Age of Onset   Alcoholism Father     Social History   Tobacco Use   Smoking status: Never   Smokeless tobacco: Never  Substance Use Topics   Alcohol use: No    Subjective:   Recovering from hip replacement- he notes his orthopedist asked him to follow up here to discuss getting a permanent handicapped placard. Patient has majority of his primary care and health needs managed through provider at the [facility]- he has opted to use our office for acute care needs.  He is concerned about a blood blister on the tip of 1st toe left foot that has been present for months; is concerned that not healing;    Objective:  Vitals:   07/01/23 0930  BP: 125/70  Pulse: 67  SpO2: 100%  Weight: 286 lb 6.4 oz (129.9 kg)  Height: 6' 6 (1.981 m)    General: Well developed, well nourished, in no acute distress  Skin : Warm and dry. Dark scabbed lesion noted on tip of 1st toe left foot Head: Normocephalic and atraumatic  Lungs: Respirations unlabored;  Neurologic: Alert and oriented; speech intact; face symmetrical; moves all extremities well; CNII-XII intact without focal deficit   Assessment:  1. Skin lesion   2. Non-healing open wound of toe, initial encounter     Plan:  Will start with referral to podiatry; may need to consider vascular consult as well;  Patient understands he needs to talk to his PCP at the [facility] about long term handicapped placard; filled out for 6 month temporary today due to recovering from hip surgery.   No follow-ups on file.  Orders Placed This Encounter  Procedures   Ambulatory referral to Podiatry    Referral Priority:   Routine    Referral Type:   Consultation     Referral Reason:   Specialty Services Required    Requested Specialty:   Podiatry    Number of Visits Requested:   1    Requested Prescriptions    No prescriptions requested or ordered in this encounter

## 2023-07-11 ENCOUNTER — Ambulatory Visit: Admitting: Podiatry

## 2023-07-19 ENCOUNTER — Encounter: Payer: Self-pay | Admitting: Orthopaedic Surgery

## 2023-07-19 ENCOUNTER — Ambulatory Visit (INDEPENDENT_AMBULATORY_CARE_PROVIDER_SITE_OTHER): Admitting: Orthopaedic Surgery

## 2023-07-19 ENCOUNTER — Other Ambulatory Visit (INDEPENDENT_AMBULATORY_CARE_PROVIDER_SITE_OTHER)

## 2023-07-19 DIAGNOSIS — Z96642 Presence of left artificial hip joint: Secondary | ICD-10-CM

## 2023-07-19 NOTE — Progress Notes (Signed)
 Post-Op Visit Note   Patient: Charles Navarro           Date of Birth: 1950/04/03           MRN: [id number] Visit Date: 07/19/2023 PCP: Jason Leita Repine, FNP   Assessment & Plan:  Chief Complaint:  Chief Complaint  Patient presents with   Left Hip - Follow-up    Left total hip arthroplasty 06/06/2023   Visit Diagnoses:  1. History of total hip replacement, left     Plan: History of Present Illness The patient presents for follow-up after hip surgery.  Patient is 6 weeks status post left total hip arthroplasty.  He experiences soreness and stiffness at the site of the scar. The surgical strips are starting to fall off. There is no thigh pain. He is able to stand and walk as recommended.  Assessment and Plan Postoperative hip replacement 6 weeks ago Postoperative status with expected soreness and stiffness. X-rays show good alignment.  - Advise removal of surgical strips at home. - Encourage standing and walking exercises. - Avoid stretching and yoga.  Follow-Up Instructions: Return in about 6 weeks (around 08/30/2023) for with lindsey.   Orders:  Orders Placed This Encounter  Procedures   XR Pelvis 1-2 Views   No orders of the defined types were placed in this encounter.   Imaging: XR Pelvis 1-2 Views Result Date: 07/19/2023 Stable left total hip replacement without complications   PMFS History: Patient Active Problem List   Diagnosis Date Noted   Primary osteoarthritis of left hip 05/19/2023   Apical variant hypertrophic cardiomyopathy (HCC) 01/28/2023   Obesity, morbid (HCC) 01/28/2023   Coronary artery disease involving native coronary artery of native heart without angina pectoris 06/24/2022   Spinal stenosis of lumbar region 04/21/2022   Pneumonia due to COVID-19 virus 02/08/2019   OSA (obstructive sleep apnea) 02/08/2019   Boil 03/06/2012   Discitis of lumbar region 01/31/2012   Diarrhea 01/31/2012   Nausea 01/31/2012   Acid reflux 01/31/2012    Weight loss, abnormal 01/31/2012   Past Medical History:  Diagnosis Date   Aortic dilatation (HCC)    mild   Bifascicular block    Bronchitis    hx of   Coronary artery disease    Depression    First degree heart block    GERD (gastroesophageal reflux disease)    Hypertension    takes meds daily   Osteoarthritis of left hip    Sleep apnea    pt does not wear CPAP   Vertigo     Family History  Problem Relation Age of Onset   Alcoholism Father     Past Surgical History:  Procedure Laterality Date   CATARACT EXTRACTION     COLONOSCOPY     LUMBAR LAMINECTOMY/DECOMPRESSION MICRODISCECTOMY  11/08/2011   Procedure: LUMBAR LAMINECTOMY/DECOMPRESSION MICRODISCECTOMY 1 LEVEL;  Surgeon: Arley SHAUNNA Helling, MD;  Location: [facility];  Service: Neurosurgery;  Laterality: Left;  left lumbar four-five decompression laminectomy microdiscectomy   LUMBAR LAMINECTOMY/DECOMPRESSION MICRODISCECTOMY Bilateral 04/21/2022   Procedure: LUMBAR TWO-THREE, LUMBAR THREE-FOUR BILATERAL LAMINECTOMY;  Surgeon: Helling Arley, MD;  Location: [facility] OR;  Service: Neurosurgery;  Laterality: Bilateral;  3C   TOTAL HIP ARTHROPLASTY Left 06/06/2023   Procedure: ARTHROPLASTY, HIP, TOTAL, ANTERIOR APPROACH;  Surgeon: Jerri Kay HERO, MD;  Location: [facility];  Service: Orthopedics;  Laterality: Left;   UMBILICAL HERNIA REPAIR  2017   WISDOM TOOTH EXTRACTION     Social History   Occupational History  Not on file  Tobacco Use   Smoking status: Never   Smokeless tobacco: Never  Vaping Use   Vaping status: Never Used  Substance and Sexual Activity   Alcohol use: No   Drug use: No   Sexual activity: Not on file

## 2023-07-20 ENCOUNTER — Ambulatory Visit (INDEPENDENT_AMBULATORY_CARE_PROVIDER_SITE_OTHER): Admitting: Podiatry

## 2023-07-20 DIAGNOSIS — M2042 Other hammer toe(s) (acquired), left foot: Secondary | ICD-10-CM

## 2023-07-20 DIAGNOSIS — G609 Hereditary and idiopathic neuropathy, unspecified: Secondary | ICD-10-CM

## 2023-07-20 NOTE — Progress Notes (Signed)
 Chief Complaint  Patient presents with   Foot Problem    Not diabetic. Takes ASA 81 mg. Left 1st toe had a blister on the tip of it. This has since healed up. No further issues with that toe.   Also reports an issue with his left 2nd toe being bent. Wonders if it is a hammertoe.    HPI: 73 y.o. male presenting today for c/o hammertoe on left second toe.  He originally made the appointment when he had a blood blister on the tip of the left great toe.  He states this resolved on its own but he wanted to keep the appointment to discuss the second toe.  He denies wearing any shoes that have been too short recently.  He does have a longer second toe.  He notes he has numbness in his feet after previous lumbosacral surgery with a secondary MRSA infection.   He only removed the left shoe for left foot evaluation today..   Past Medical History:  Diagnosis Date   Aortic dilatation (HCC)    mild   Bifascicular block    Bronchitis    hx of   Coronary artery disease    Depression    First degree heart block    GERD (gastroesophageal reflux disease)    Hypertension    takes meds daily   Osteoarthritis of left hip    Sleep apnea    pt does not wear CPAP   Vertigo     Past Surgical History:  Procedure Laterality Date   CATARACT EXTRACTION     COLONOSCOPY     LUMBAR LAMINECTOMY/DECOMPRESSION MICRODISCECTOMY  11/08/2011   Procedure: LUMBAR LAMINECTOMY/DECOMPRESSION MICRODISCECTOMY 1 LEVEL;  Surgeon: Arley SHAUNNA Helling, MD;  Location: [facility];  Service: Neurosurgery;  Laterality: Left;  left lumbar four-five decompression laminectomy microdiscectomy   LUMBAR LAMINECTOMY/DECOMPRESSION MICRODISCECTOMY Bilateral 04/21/2022   Procedure: LUMBAR TWO-THREE, LUMBAR THREE-FOUR BILATERAL LAMINECTOMY;  Surgeon: Helling Arley, MD;  Location: [facility] OR;  Service: Neurosurgery;  Laterality: Bilateral;  3C   TOTAL HIP ARTHROPLASTY Left 06/06/2023   Procedure: ARTHROPLASTY, HIP, TOTAL, ANTERIOR APPROACH;  Surgeon: Jerri Kay HERO, MD;  Location: [facility];  Service: Orthopedics;  Laterality: Left;   UMBILICAL HERNIA REPAIR  2017   WISDOM TOOTH EXTRACTION      Allergies  Allergen Reactions   Levaquin  [Levofloxacin ] Anaphylaxis    Shut down kidneys, numbness in toes    Codeine Nausea And Vomiting    dizziness   Gabapentin     Leg cramps   Oxycodone -Acetaminophen       Dizziness, GI Intolerance   Tramadol Nausea And Vomiting    Doesn't work at all    Bactrim [Sulfamethoxazole-Trimethoprim] Itching and Rash    PHYSICAL EXAM: General: The patient is alert and oriented x3 in no acute distress.  Dermatology: Skin is warm, dry and supple left leg and foot.  Interspaces are clear of debris.  There is no residual blister formation to the left great toe.  Vascular: Palpable pedal pulses left foot.  Capillary refill within normal limits.  Neurological: Decreased protective sensation, vibratory sensation, and temperature sensation to the forefoot left.  Musculoskeletal Exam:  There is a semirigid dorsal contracture of the left second toe  ASSESSMENT/PLAN OF CARE: 1. Hammertoe of left foot   2. Idiopathic peripheral neuropathy    Discussed patient's condition and possible etiologies today.  Discussed conservative treatment options with patient today, including shoe modification / arch supports, aperture pads, cortisone injection,  NSAID topical / oral therapy, and toe splints and shields.  Briefly discussed surgical intervention if conservative options are not successful.    Dispensed a gel toe crest to wear during the day and toe alignment splint to wear at night to decrease the extensor pull on the toe.  He is not interested in surgical intervention at this time.  Return if symptoms worsen or fail to improve.  Awanda CHARM Imperial, DPM, FACFAS [facility]     [address], [place] [address]

## 2023-07-23 ENCOUNTER — Other Ambulatory Visit: Payer: Self-pay | Admitting: Physician Assistant

## 2023-07-28 ENCOUNTER — Other Ambulatory Visit (HOSPITAL_COMMUNITY): Payer: Self-pay | Admitting: Internal Medicine

## 2023-07-28 DIAGNOSIS — I77819 Aortic ectasia, unspecified site: Secondary | ICD-10-CM

## 2023-07-28 DIAGNOSIS — I2510 Atherosclerotic heart disease of native coronary artery without angina pectoris: Secondary | ICD-10-CM

## 2023-07-28 DIAGNOSIS — I318 Other specified diseases of pericardium: Secondary | ICD-10-CM

## 2023-07-28 DIAGNOSIS — I253 Aneurysm of heart: Secondary | ICD-10-CM

## 2023-08-02 ENCOUNTER — Ambulatory Visit (HOSPITAL_COMMUNITY)
Admission: RE | Admit: 2023-08-02 | Discharge: 2023-08-02 | Disposition: A | Discharge status: Home / Self Care | Source: Ambulatory Visit | Attending: Cardiology | Admitting: Cardiology

## 2023-08-02 DIAGNOSIS — I7121 Aneurysm of the ascending aorta, without rupture: Secondary | ICD-10-CM | POA: Insufficient documentation

## 2023-08-02 DIAGNOSIS — I318 Other specified diseases of pericardium: Secondary | ICD-10-CM | POA: Diagnosis not present

## 2023-08-02 DIAGNOSIS — I77819 Aortic ectasia, unspecified site: Secondary | ICD-10-CM

## 2023-08-02 DIAGNOSIS — I2510 Atherosclerotic heart disease of native coronary artery without angina pectoris: Secondary | ICD-10-CM | POA: Insufficient documentation

## 2023-08-02 DIAGNOSIS — I253 Aneurysm of heart: Secondary | ICD-10-CM | POA: Diagnosis not present

## 2023-08-02 MED ORDER — IOHEXOL 350 MG/ML SOLN
75.0000 mL | Freq: Once | INTRAVENOUS | Status: AC | PRN
  Administered 2023-08-02: 75 mL via INTRAVENOUS

## 2023-08-08 NOTE — Telephone Encounter (Signed)
 Error note

## 2023-08-09 ENCOUNTER — Other Ambulatory Visit: Payer: Self-pay | Admitting: Physician Assistant

## 2023-08-15 ENCOUNTER — Ambulatory Visit: Payer: Self-pay

## 2023-08-15 DIAGNOSIS — I77819 Aortic ectasia, unspecified site: Secondary | ICD-10-CM

## 2023-08-15 DIAGNOSIS — I318 Other specified diseases of pericardium: Secondary | ICD-10-CM

## 2023-08-15 DIAGNOSIS — I2510 Atherosclerotic heart disease of native coronary artery without angina pectoris: Secondary | ICD-10-CM

## 2023-08-23 ENCOUNTER — Ambulatory Visit (HOSPITAL_COMMUNITY): Admit: 2023-08-23 | Admitting: Orthopedic Surgery

## 2023-08-23 SURGERY — ARTHROPLASTY, HIP, TOTAL, ANTERIOR APPROACH
Anesthesia: Spinal | Site: Hip | Laterality: Left

## 2023-08-30 ENCOUNTER — Encounter: Admitting: Physician Assistant

## 2023-09-06 ENCOUNTER — Other Ambulatory Visit: Payer: Self-pay | Admitting: Physician Assistant

## 2023-09-06 ENCOUNTER — Ambulatory Visit (INDEPENDENT_AMBULATORY_CARE_PROVIDER_SITE_OTHER): Admitting: Physician Assistant

## 2023-09-06 DIAGNOSIS — Z96642 Presence of left artificial hip joint: Secondary | ICD-10-CM

## 2023-09-06 MED ORDER — AMOXICILLIN 500 MG PO CAPS
ORAL_CAPSULE | ORAL | 2 refills | Status: AC
Start: 2023-09-06 — End: ?

## 2023-09-06 NOTE — Progress Notes (Signed)
 Post-Op Visit Note   Patient: Charles Navarro           Date of Birth: 1950-09-24           MRN: [id number] Visit Date: 09/06/2023 PCP: Jason Leita Repine, FNP   Assessment & Plan:  Chief Complaint:  Chief Complaint  Patient presents with   Left Hip - Pain, Routine Post Op   Visit Diagnoses:  1. History of total hip replacement, left     Plan: Patient is a pleasant 73 year old gentleman who comes in today 3 months status post left total hip replacement 06/06/2023.  He has been doing well.  No complaints.  Examination of his left hip reveals painless hip flexion logroll.  He is neurovascularly intact distally.  This point, he will advance activity as tolerated.  Dental prophylaxis reinforced.  Follow-up in 3 months for repeat evaluation and AP pelvis x-rays.  Call with concerns or questions.  Follow-Up Instructions: Return in about 3 months (around 12/06/2023).   Orders:  No orders of the defined types were placed in this encounter.  No orders of the defined types were placed in this encounter.   Imaging: No new imaging  PMFS History: Patient Active Problem List   Diagnosis Date Noted   Primary osteoarthritis of left hip 05/19/2023   Apical variant hypertrophic cardiomyopathy (HCC) 01/28/2023   Obesity, morbid (HCC) 01/28/2023   Coronary artery disease involving native coronary artery of native heart without angina pectoris 06/24/2022   Spinal stenosis of lumbar region 04/21/2022   Pneumonia due to COVID-19 virus 02/08/2019   OSA (obstructive sleep apnea) 02/08/2019   Boil 03/06/2012   Discitis of lumbar region 01/31/2012   Diarrhea 01/31/2012   Nausea 01/31/2012   Acid reflux 01/31/2012   Weight loss, abnormal 01/31/2012   Past Medical History:  Diagnosis Date   Aortic dilatation (HCC)    mild   Bifascicular block    Bronchitis    hx of   Coronary artery disease    Depression    First degree heart block    GERD (gastroesophageal reflux disease)     Hypertension    takes meds daily   Osteoarthritis of left hip    Sleep apnea    pt does not wear CPAP   Vertigo     Family History  Problem Relation Age of Onset   Alcoholism Father     Past Surgical History:  Procedure Laterality Date   CATARACT EXTRACTION     COLONOSCOPY     LUMBAR LAMINECTOMY/DECOMPRESSION MICRODISCECTOMY  11/08/2011   Procedure: LUMBAR LAMINECTOMY/DECOMPRESSION MICRODISCECTOMY 1 LEVEL;  Surgeon: Arley SHAUNNA Helling, MD;  Location: [facility];  Service: Neurosurgery;  Laterality: Left;  left lumbar four-five decompression laminectomy microdiscectomy   LUMBAR LAMINECTOMY/DECOMPRESSION MICRODISCECTOMY Bilateral 04/21/2022   Procedure: LUMBAR TWO-THREE, LUMBAR THREE-FOUR BILATERAL LAMINECTOMY;  Surgeon: Helling Arley, MD;  Location: [facility] OR;  Service: Neurosurgery;  Laterality: Bilateral;  3C   TOTAL HIP ARTHROPLASTY Left 06/06/2023   Procedure: ARTHROPLASTY, HIP, TOTAL, ANTERIOR APPROACH;  Surgeon: Jerri Kay HERO, MD;  Location: [facility] OR;  Service: Orthopedics;  Laterality: Left;   UMBILICAL HERNIA REPAIR  2017   WISDOM TOOTH EXTRACTION     Social History   Occupational History   Not on file  Tobacco Use   Smoking status: Never   Smokeless tobacco: Never  Vaping Use   Vaping status: Never Used  Substance and Sexual Activity   Alcohol use: No   Drug use: No   Sexual  activity: Not on file

## 2023-10-04 ENCOUNTER — Ambulatory Visit

## 2023-10-04 VITALS — Ht 78.0 in | Wt 286.0 lb

## 2023-10-04 DIAGNOSIS — Z1211 Encounter for screening for malignant neoplasm of colon: Secondary | ICD-10-CM

## 2023-10-04 DIAGNOSIS — Z0000 Encounter for general adult medical examination without abnormal findings: Secondary | ICD-10-CM

## 2023-10-04 NOTE — Progress Notes (Signed)
 Subjective:   Charles Navarro is a 73 y.o. who presents for a Medicare Wellness preventive visit.  As a reminder, Annual Wellness Visits don't include a physical exam, and some assessments may be limited, especially if this visit is performed virtually. We may recommend an in-person follow-up visit with your provider if needed.  Visit Complete: Virtual I connected with  Charles Navarro on 10/04/23 by a audio enabled telemedicine application and verified that I am speaking with the correct person using two identifiers.  Patient Location: Home  Provider Location: Home Office  I discussed the limitations of evaluation and management by telemedicine. The patient expressed understanding and agreed to proceed.  Vital Signs: Because this visit was a virtual/telehealth visit, some criteria may be missing or patient reported. Any vitals not documented were not able to be obtained and vitals that have been documented are patient reported.    Persons Participating in Visit: Patient.  AWV Questionnaire: No: Patient Medicare AWV questionnaire was not completed prior to this visit.  Cardiac Risk Factors include: advanced age (>11men, >30 women);male gender     Objective:    Today's Vitals   10/04/23 1400  Weight: 286 lb (129.7 kg)  Height: 6' 6 (1.981 m)   Body mass index is 33.05 kg/m.     10/04/2023    2:09 PM 06/06/2023    7:10 AM 06/03/2023    1:37 PM 06/14/2022    2:10 PM 01/24/2012    9:04 PM 01/13/2012    6:00 AM 01/12/2012   10:50 PM  Advanced Directives  Does Patient Have a Medical Advance Directive? No No No No Patient does not have advance directive  Patient would not like information  Patient would not like information   Would patient like information on creating a medical advance directive? No - Patient declined No - Patient declined No - Patient declined No - Patient declined     Pre-existing out of facility DNR order (yellow form or pink MOST form)     No  No  No      Data saved  with a previous flowsheet row definition    Current Medications (verified) Outpatient Encounter Medications as of 10/04/2023  Medication Sig   amoxicillin  (AMOXIL ) 500 MG capsule Take 4 pills one hour prior to dental work (Patient not taking: Reported on 10/04/2023)   CAPSAICIN EX Apply 1 Application topically daily as needed (back pain).   CHIA SEED PO Take 15 mLs by mouth daily. Quart of water with lemon juice, raw honey,   [facility] ASPIRIN  ADULT LOW DOSE 81 MG chewable tablet Chew 1 tablet (81 mg total) by mouth 2 (two) times daily. To be taken after surgery to prevent blood clots   cycloSPORINE (RESTASIS) 0.05 % ophthalmic emulsion Place 1 drop into both eyes 2 (two) times a week.   docusate sodium  (COLACE) 100 MG capsule TAKE 1 CAPSULE BY MOUTH DAILY AS NEEDED.   lidocaine  (LIDODERM ) 5 % Place 1 patch onto the skin daily as needed (back pain).   lisinopril -hydrochlorothiazide  (ZESTORETIC ) 10-12.5 MG tablet Take 1 tablet by mouth daily.   loratadine  (CLARITIN ) 10 MG tablet Take 10 mg by mouth daily as needed for allergies.   meclizine  (ANTIVERT ) 25 MG tablet Take 25 mg by mouth 3 (three) times daily as needed for dizziness. 1 - 3 tablets as needed for dizziness   Menthol -Camphor (TIGER BALM ARTHRITIS RUB EX) Apply 1 Application topically daily as needed (pain).   Multiple Vitamin (MULTIVITAMIN WITH MINERALS) TABS Take  1 tablet by mouth daily.   ondansetron  (ZOFRAN ) 4 MG tablet TAKE 1 TABLET BY MOUTH EVERY 8 HOURS AS NEEDED FOR NAUSEA AND VOMITING   pantoprazole  (PROTONIX ) 40 MG tablet Take 40 mg by mouth daily.   sodium chloride  (OCEAN) 0.65 % nasal spray Place into the nose as needed for congestion.   zolpidem (AMBIEN) 10 MG tablet Take 5 mg by mouth at bedtime as needed for sleep.   No facility-administered encounter medications on file as of 10/04/2023.    Allergies (verified) Levaquin  [levofloxacin ], Codeine, Gabapentin, Oxycodone -acetaminophen , Tramadol, and Bactrim  [sulfamethoxazole-trimethoprim]   History: Past Medical History:  Diagnosis Date   Aortic dilatation    mild   Bifascicular block    Bronchitis    hx of   Coronary artery disease    Depression    First degree heart block    GERD (gastroesophageal reflux disease)    Hypertension    takes meds daily   Osteoarthritis of left hip    Sleep apnea    pt does not wear CPAP   Vertigo    Past Surgical History:  Procedure Laterality Date   CATARACT EXTRACTION     COLONOSCOPY     LUMBAR LAMINECTOMY/DECOMPRESSION MICRODISCECTOMY  11/08/2011   Procedure: LUMBAR LAMINECTOMY/DECOMPRESSION MICRODISCECTOMY 1 LEVEL;  Surgeon: Arley SHAUNNA Helling, MD;  Location: [facility];  Service: Neurosurgery;  Laterality: Left;  left lumbar four-five decompression laminectomy microdiscectomy   LUMBAR LAMINECTOMY/DECOMPRESSION MICRODISCECTOMY Bilateral 04/21/2022   Procedure: LUMBAR TWO-THREE, LUMBAR THREE-FOUR BILATERAL LAMINECTOMY;  Surgeon: Helling Arley, MD;  Location: [facility] OR;  Service: Neurosurgery;  Laterality: Bilateral;  3C   TOTAL HIP ARTHROPLASTY Left 06/06/2023   Procedure: ARTHROPLASTY, HIP, TOTAL, ANTERIOR APPROACH;  Surgeon: Jerri Kay HERO, MD;  Location: [facility] OR;  Service: Orthopedics;  Laterality: Left;   UMBILICAL HERNIA REPAIR  2017   WISDOM TOOTH EXTRACTION     Family History  Problem Relation Age of Onset   Alcoholism Father    Social History   Socioeconomic History   Marital status: Married    Spouse name: Not on file   Number of children: 0   Years of education: Not on file   Highest education level: Not on file  Occupational History   Not on file  Tobacco Use   Smoking status: Never   Smokeless tobacco: Never  Vaping Use   Vaping status: Never Used  Substance and Sexual Activity   Alcohol use: No   Drug use: No   Sexual activity: Not on file  Other Topics Concern   Not on file  Social History Narrative   0 biological children, 2 stepchildren   Social Drivers of Health    Financial Resource Strain: Low Risk  (10/04/2023)   Overall Financial Resource Strain (CARDIA)    Difficulty of Paying Living Expenses: Not hard at all  Food Insecurity: No Food Insecurity (10/04/2023)   Hunger Vital Sign    Worried About Running Out of Food in the Last Year: Never true    Ran Out of Food in the Last Year: Never true  Transportation Needs: No Transportation Needs (10/04/2023)   PRAPARE - Administrator, Civil Service (Medical): No    Lack of Transportation (Non-Medical): No  Physical Activity: Insufficiently Active (10/04/2023)   Exercise Vital Sign    Days of Exercise per Week: 4 days    Minutes of Exercise per Session: 30 min  Stress: Stress Concern Present (10/04/2023)   [employer] of Occupational Health -  Occupational Stress Questionnaire    Feeling of Stress: To some extent  Social Connections: Socially Integrated (10/04/2023)   Social Connection and Isolation Panel    Frequency of Communication with Friends and Family: More than three times a week    Frequency of Social Gatherings with Friends and Family: More than three times a week    Attends Religious Services: More than 4 times per year    Active Member of [employer] or Organizations: Yes    Attends Engineer, structural: More than 4 times per year    Marital Status: Married    Tobacco Counseling Counseling given: Not Answered    Clinical Intake:  Pre-visit preparation completed: Yes  Pain : No/denies pain     BMI - recorded: 33.05 Nutritional Status: BMI > 30  Obese Nutritional Risks: None Diabetes: No  No results found for: HGBA1C   How often do you need to have someone help you when you read instructions, pamphlets, or other written materials from your doctor or pharmacy?: 1 - Never  Interpreter Needed?: No  Information entered by :: Rojelio Blush LPN   Activities of Daily Living     10/04/2023    2:08 PM 06/03/2023    1:40 PM  In your present state of  health, do you have any difficulty performing the following activities:  Hearing? 0   Vision? 0   Difficulty concentrating or making decisions? 0   Walking or climbing stairs? 1   Comment Uses a Cane   Dressing or bathing? 0   Doing errands, shopping? 0 0  Preparing Food and eating ? N   Using the Toilet? N   In the past six months, have you accidently leaked urine? N   Do you have problems with loss of bowel control? N   Managing your Medications? N   Managing your Finances? N   Housekeeping or managing your Housekeeping? N     Patient Care Team: Jason Leita Repine, FNP (Inactive) as PCP - General (Internal Medicine) Santo Stanly LABOR, MD as PCP - Cardiology (Cardiology) Jason Leita Repine, FNP (Inactive) (Internal Medicine)  I have updated your Care Teams any recent Medical Services you may have received from other providers in the past year.     Assessment:   This is a routine wellness examination for Charles Navarro.  Hearing/Vision screen Hearing Screening - Comments:: Denies hearing difficulties   Vision Screening - Comments:: Wears rx glasses - up to date with routine eye exams with  Dr Octavia   Goals Addressed               This Visit's Progress     Increase physical activity (pt-stated)        Stay active.       Depression Screen     10/04/2023    2:06 PM 06/14/2022    2:23 PM 05/27/2022    9:41 AM 03/02/2022    1:33 PM 05/23/2012   10:27 AM  PHQ 2/9 Scores  PHQ - 2 Score 2 3 0 0 0  PHQ- 9 Score 2        Fall Risk     10/04/2023    2:08 PM 06/14/2022    2:24 PM 03/02/2022    1:33 PM 05/23/2012   10:27 AM 01/31/2012    3:12 PM  Fall Risk   Falls in the past year? 0 0 0 No    Number falls in past yr: 0 0 0    Injury  with Fall? 0 0 0    Risk for fall due to : No Fall Risks No Fall Risks No Fall Risks Impaired balance/gait  Impaired balance/gait   Risk for fall due to: Comment    uses cane  using cane   Follow up Falls evaluation completed Falls  evaluation completed Falls evaluation completed       Data saved with a previous flowsheet row definition    MEDICARE RISK AT HOME:  Medicare Risk at Home Any stairs in or around the home?: Yes If so, are there any without handrails?: No Home free of loose throw rugs in walkways, pet beds, electrical cords, etc?: Yes Adequate lighting in your home to reduce risk of falls?: Yes Life alert?: No Use of a cane, walker or w/c?: Yes Grab bars in the bathroom?: Yes Shower chair or bench in shower?: No Elevated toilet seat or a handicapped toilet?: Yes  TIMED UP AND GO:  Was the test performed?  No  Cognitive Function: 6CIT completed        10/04/2023    2:09 PM 06/14/2022    2:27 PM  6CIT Screen  What Year? 0 points 0 points  What month? 0 points 0 points  What time? 0 points 0 points  Count back from 20 0 points 0 points  Months in reverse 0 points 0 points  Repeat phrase 0 points 0 points  Total Score 0 points 0 points    Immunizations Immunization History  Administered Date(s) Administered   INFLUENZA, HIGH DOSE SEASONAL PF 10/04/2016   Influenza-Unspecified 10/14/2018   Pneumococcal Conjugate-13 08/20/2014   Pneumococcal Polysaccharide-23 08/02/2016   Tdap 08/20/2011   Zoster, Live 10/29/2013    Screening Tests Health Maintenance  Topic Date Due   Zoster Vaccines- Shingrix (1 of 2) 11/06/1969   Colonoscopy  Never done   DTaP/Tdap/Td (2 - Td or Tdap) 08/19/2021   Influenza Vaccine  08/05/2023   COVID-19 Vaccine (1) 12/19/2023 (Originally 11/07/1955)   Medicare Annual Wellness (AWV)  10/03/2024   Pneumococcal Vaccine: 50+ Years  Completed   Hepatitis C Screening  Completed   HPV VACCINES  Aged Out   Meningococcal B Vaccine  Aged Out    Health Maintenance Items Addressed: Referral sent to GI for colonoscopy  Additional Screening:  Vision Screening: Recommended annual ophthalmology exams for early detection of glaucoma and other disorders of the eye. Is the  patient up to date with their annual eye exam?  Yes  Who is the provider or what is the name of the office in which the patient attends annual eye exams? Dr Octavia  Dental Screening: Recommended annual dental exams for proper oral hygiene  Community Resource Referral / Chronic Care Management: CRR required this visit?  No   CCM required this visit?  No   Plan:    I have personally reviewed and noted the following in the patient's chart:   Medical and social history Use of alcohol, tobacco or illicit drugs  Current medications and supplements including opioid prescriptions. Patient is not currently taking opioid prescriptions. Functional ability and status Nutritional status Physical activity Advanced directives List of other physicians Hospitalizations, surgeries, and ER visits in previous 12 months Vitals Screenings to include cognitive, depression, and falls Referrals and appointments  In addition, I have reviewed and discussed with patient certain preventive protocols, quality metrics, and best practice recommendations. A written personalized care plan for preventive services as well as general preventive health recommendations were provided to patient.  Rojelio LELON Blush, LPN   [id number]   After Visit Summary: (MyChart) Due to this being a telephonic visit, the after visit summary with patients personalized plan was offered to patient via MyChart   Notes: Nothing significant to report at this time.

## 2023-10-04 NOTE — Patient Instructions (Addendum)
 [name],  Thank you for taking the time for your Medicare Wellness Visit. I appreciate your continued commitment to your health goals. Please review the care plan we discussed, and feel free to reach out if I can assist you further.  Medicare recommends these wellness visits once per year to help you and your care team stay ahead of potential health issues. These visits are designed to focus on prevention, allowing your provider to concentrate on managing your acute and chronic conditions during your regular appointments.  Please note that Annual Wellness Visits do not include a physical exam. Some assessments may be limited, especially if the visit was conducted virtually. If needed, we may recommend a separate in-person follow-up with your provider.  Ongoing Care Seeing your primary care provider every 3 to 6 months helps us  monitor your health and provide consistent, personalized care.   Referrals If a referral was made during today's visit and you haven't received any updates within two weeks, please contact the referred provider directly to check on the status.  Recommended Screenings:  Health Maintenance  Topic Date Due   Zoster (Shingles) Vaccine (1 of 2) 11/06/1969   Colon Cancer Screening  Never done   DTaP/Tdap/Td vaccine (2 - Td or Tdap) 08/19/2021   Flu Shot  08/05/2023   COVID-19 Vaccine (1) 12/19/2023*   Medicare Annual Wellness Visit  10/03/2024   Pneumococcal Vaccine for age over 48  Completed   Hepatitis C Screening  Completed   HPV Vaccine  Aged Out   Meningitis B Vaccine  Aged Out  *Topic was postponed. The date shown is not the original due date.       10/04/2023    2:09 PM  Advanced Directives  Does Patient Have a Medical Advance Directive? No  Would patient like information on creating a medical advance directive? No - Patient declined   Advance Care Planning is important because it: Ensures you receive medical care that aligns with your values, goals, and  preferences. Provides guidance to your family and loved ones, reducing the emotional burden of decision-making during critical moments.  Vision: Annual vision screenings are recommended for early detection of glaucoma, cataracts, and diabetic retinopathy. These exams can also reveal signs of chronic conditions such as diabetes and high blood pressure.  Dental: Annual dental screenings help detect early signs of oral cancer, gum disease, and other conditions linked to overall health, including heart disease and diabetes.  Please see the attached documents for additional preventive care recommendations.

## 2023-10-05 DIAGNOSIS — H6123 Impacted cerumen, bilateral: Secondary | ICD-10-CM | POA: Diagnosis not present

## 2023-10-19 DIAGNOSIS — H16223 Keratoconjunctivitis sicca, not specified as Sjogren's, bilateral: Secondary | ICD-10-CM | POA: Diagnosis not present

## 2023-10-19 DIAGNOSIS — Z961 Presence of intraocular lens: Secondary | ICD-10-CM | POA: Diagnosis not present

## 2023-11-07 ENCOUNTER — Encounter: Payer: Self-pay | Admitting: Radiology

## 2023-11-22 ENCOUNTER — Other Ambulatory Visit: Payer: Self-pay | Admitting: Family Medicine

## 2023-11-22 ENCOUNTER — Telehealth: Payer: Self-pay | Admitting: Family

## 2023-11-22 DIAGNOSIS — M25552 Pain in left hip: Secondary | ICD-10-CM

## 2023-11-22 MED ORDER — NONFORMULARY OR COMPOUNDED ITEM: 0 refills | Status: AC

## 2023-11-22 NOTE — Telephone Encounter (Signed)
 Pt came in asking to get a new referral for massage therapy that is ocvered by his insurance since when he received it in the past it really helped his back pain. Please call pt with update on referral.

## 2023-11-23 NOTE — Telephone Encounter (Signed)
 Pt is wanting to pick up form - please place up front

## 2023-11-23 NOTE — Telephone Encounter (Signed)
 Form picked up

## 2023-11-23 NOTE — Telephone Encounter (Signed)
 Pt called. LDVM advising if we needed to fax the order or if it will be picked up.

## 2023-12-06 ENCOUNTER — Ambulatory Visit: Admitting: Orthopaedic Surgery

## 2023-12-07 ENCOUNTER — Ambulatory Visit: Admitting: Orthopaedic Surgery

## 2023-12-08 ENCOUNTER — Ambulatory Visit: Admitting: Orthopaedic Surgery

## 2023-12-08 ENCOUNTER — Other Ambulatory Visit: Payer: Self-pay

## 2023-12-08 DIAGNOSIS — Z96642 Presence of left artificial hip joint: Secondary | ICD-10-CM

## 2023-12-08 NOTE — Progress Notes (Signed)
 Office Visit Note   Patient: Charles Navarro           Date of Birth: [birth year]           MRN: [id number] Visit Date: 12/08/2023              Requested by: Jason Leita Repine, FNP No address on file PCP: Jason Leita Repine, FNP (Inactive)   Assessment & Plan: Visit Diagnoses:  1. History of total hip replacement, left     Plan: Patient is a 73 year old gentleman with lumbar spondylosis and 6 months status post left total hip arthroplasty.  Doing well in regards to the hip arthroplasty.  In regards to the lumbar spine, detailed discussion on treatment options was had.  Based on options he would like to continue with symptomatic management.  Follow-Up Instructions: Return in about 6 months (around 06/07/2024).   Orders:  Orders Placed This Encounter  Procedures   XR Pelvis 1-2 Views   XR Lumbar Spine 2-3 Views   No orders of the defined types were placed in this encounter.     Procedures: No procedures performed   Clinical Data: No additional findings.   Subjective: Chief Complaint  Patient presents with   Left Hip - Follow-up    Left THA 06/06/2023    HPI Patient comes in today for 3-month postop check from left total hip arthroplasty and evaluation of low back pain.  States that he gets stiffness across the low back after 30 minutes of walking or standing.  Denies any radicular symptoms. Review of Systems  Constitutional: Negative.   HENT: Negative.    Eyes: Negative.   Respiratory: Negative.    Cardiovascular: Negative.   Gastrointestinal: Negative.   Endocrine: Negative.   Genitourinary: Negative.   Skin: Negative.   Allergic/Immunologic: Negative.   Neurological: Negative.   Hematological: Negative.   Psychiatric/Behavioral: Negative.    All other systems reviewed and are negative.    Objective: Vital Signs: There were no vitals taken for this visit.  Physical Exam Vitals and nursing note reviewed.  Constitutional:      Appearance: He is  well-developed.  HENT:     Head: Normocephalic and atraumatic.  Eyes:     Pupils: Pupils are equal, round, and reactive to light.  Pulmonary:     Effort: Pulmonary effort is normal.  Abdominal:     Palpations: Abdomen is soft.  Musculoskeletal:        General: Normal range of motion.     Cervical back: Neck supple.  Skin:    General: Skin is warm.  Neurological:     Mental Status: He is alert and oriented to person, place, and time.  Psychiatric:        Behavior: Behavior normal.        Thought Content: Thought content normal.        Judgment: Judgment normal.     Ortho Exam Exam of the left hip shows fully healed surgical scar.  Full is painless range of motion.  Lumbar spine is nonfocal. Specialty Comments:  No specialty comments available.  Imaging: XR Lumbar Spine 2-3 Views Result Date: 12/08/2023 X-rays of the lumbar spine show degenerative spondylolisthesis at L4-5  XR Pelvis 1-2 Views Result Date: 12/08/2023 Stable total hip replacement without complications    PMFS History: Patient Active Problem List   Diagnosis Date Noted   Primary osteoarthritis of left hip 05/19/2023   Apical variant hypertrophic cardiomyopathy (HCC) 01/28/2023   Obesity, morbid (HCC) 01/28/2023  Coronary artery disease involving native coronary artery of native heart without angina pectoris 06/24/2022   Spinal stenosis of lumbar region 04/21/2022   Pneumonia due to COVID-19 virus 02/08/2019   OSA (obstructive sleep apnea) 02/08/2019   Boil 03/06/2012   Discitis of lumbar region 01/31/2012   Diarrhea 01/31/2012   Nausea 01/31/2012   Acid reflux 01/31/2012   Weight loss, abnormal 01/31/2012   Past Medical History:  Diagnosis Date   Aortic dilatation    mild   Bifascicular block    Bronchitis    hx of   Coronary artery disease    Depression    First degree heart block    GERD (gastroesophageal reflux disease)    Hypertension    takes meds daily   Osteoarthritis of left hip     Sleep apnea    pt does not wear CPAP   Vertigo     Family History  Problem Relation Age of Onset   Alcoholism Father     Past Surgical History:  Procedure Laterality Date   CATARACT EXTRACTION     COLONOSCOPY     LUMBAR LAMINECTOMY/DECOMPRESSION MICRODISCECTOMY  11/08/2011   Procedure: LUMBAR LAMINECTOMY/DECOMPRESSION MICRODISCECTOMY 1 LEVEL;  Surgeon: Arley SHAUNNA Helling, MD;  Location: [facility];  Service: Neurosurgery;  Laterality: Left;  left lumbar four-five decompression laminectomy microdiscectomy   LUMBAR LAMINECTOMY/DECOMPRESSION MICRODISCECTOMY Bilateral 04/21/2022   Procedure: LUMBAR TWO-THREE, LUMBAR THREE-FOUR BILATERAL LAMINECTOMY;  Surgeon: Helling Arley, MD;  Location: [facility];  Service: Neurosurgery;  Laterality: Bilateral;  3C   TOTAL HIP ARTHROPLASTY Left 06/06/2023   Procedure: ARTHROPLASTY, HIP, TOTAL, ANTERIOR APPROACH;  Surgeon: Jerri Kay HERO, MD;  Location: [facility] OR;  Service: Orthopedics;  Laterality: Left;   UMBILICAL HERNIA REPAIR  2017   WISDOM TOOTH EXTRACTION     Social History   Occupational History   Not on file  Tobacco Use   Smoking status: Never   Smokeless tobacco: Never  Vaping Use   Vaping status: Never Used  Substance and Sexual Activity   Alcohol use: No   Drug use: No   Sexual activity: Not on file

## 2024-01-30 ENCOUNTER — Ambulatory Visit: Admitting: Family Medicine

## 2024-02-24 ENCOUNTER — Ambulatory Visit: Admitting: Family Medicine

## 2024-06-07 ENCOUNTER — Ambulatory Visit: Admitting: Orthopaedic Surgery

## 2024-10-11 ENCOUNTER — Ambulatory Visit
